# Patient Record
Sex: Female | Born: 1950 | Race: White | Hispanic: No | Marital: Married | Smoking: Former smoker
Health system: Southern US, Community
[De-identification: ages and names within clinical notes are randomized; demographics above are authoritative.]

## PROBLEM LIST (undated history)

## (undated) DIAGNOSIS — G709 Myoneural disorder, unspecified: Secondary | ICD-10-CM

## (undated) DIAGNOSIS — E785 Hyperlipidemia, unspecified: Secondary | ICD-10-CM

## (undated) DIAGNOSIS — I10 Essential (primary) hypertension: Secondary | ICD-10-CM

## (undated) DIAGNOSIS — J040 Acute laryngitis: Secondary | ICD-10-CM

## (undated) DIAGNOSIS — F419 Anxiety disorder, unspecified: Secondary | ICD-10-CM

## (undated) DIAGNOSIS — Z923 Personal history of irradiation: Secondary | ICD-10-CM

## (undated) DIAGNOSIS — R5383 Other fatigue: Secondary | ICD-10-CM

## (undated) DIAGNOSIS — Z87898 Personal history of other specified conditions: Secondary | ICD-10-CM

## (undated) DIAGNOSIS — D689 Coagulation defect, unspecified: Secondary | ICD-10-CM

## (undated) DIAGNOSIS — R0789 Other chest pain: Secondary | ICD-10-CM

## (undated) DIAGNOSIS — R7303 Prediabetes: Secondary | ICD-10-CM

## (undated) DIAGNOSIS — K219 Gastro-esophageal reflux disease without esophagitis: Secondary | ICD-10-CM

## (undated) DIAGNOSIS — C801 Malignant (primary) neoplasm, unspecified: Secondary | ICD-10-CM

## (undated) DIAGNOSIS — K224 Dyskinesia of esophagus: Secondary | ICD-10-CM

## (undated) DIAGNOSIS — M8580 Other specified disorders of bone density and structure, unspecified site: Secondary | ICD-10-CM

## (undated) HISTORY — DX: Coagulation defect, unspecified: D68.9

## (undated) HISTORY — DX: Other chest pain: R07.89

## (undated) HISTORY — DX: Gastro-esophageal reflux disease without esophagitis: K21.9

## (undated) HISTORY — PX: KNEE SURGERY: SHX244

## (undated) HISTORY — PX: SHOULDER SURGERY: SHX246

## (undated) HISTORY — DX: Dyskinesia of esophagus: K22.4

## (undated) HISTORY — DX: Malignant (primary) neoplasm, unspecified: C80.1

## (undated) HISTORY — DX: Essential (primary) hypertension: I10

## (undated) HISTORY — DX: Hyperlipidemia, unspecified: E78.5

## (undated) HISTORY — DX: Personal history of other specified conditions: Z87.898

## (undated) HISTORY — DX: Acute laryngitis: J04.0

## (undated) HISTORY — PX: ANKLE FRACTURE SURGERY: SHX122

## (undated) HISTORY — PX: VAGINAL HYSTERECTOMY: SUR661

## (undated) HISTORY — PX: OTHER SURGICAL HISTORY: SHX169

## (undated) HISTORY — DX: Other specified disorders of bone density and structure, unspecified site: M85.80

## (undated) HISTORY — DX: Other fatigue: R53.83

---
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
## 1998-06-19 ENCOUNTER — Other Ambulatory Visit: Admission: RE | Admit: 1998-06-19 | Discharge: 1998-06-19 | Payer: Self-pay | Admitting: Obstetrics and Gynecology

## 2002-01-26 ENCOUNTER — Other Ambulatory Visit: Admission: RE | Admit: 2002-01-26 | Discharge: 2002-01-26 | Payer: Self-pay | Admitting: Obstetrics and Gynecology

## 2003-03-16 ENCOUNTER — Other Ambulatory Visit: Admission: RE | Admit: 2003-03-16 | Discharge: 2003-03-16 | Payer: Self-pay | Admitting: Obstetrics and Gynecology

## 2004-04-08 ENCOUNTER — Emergency Department (HOSPITAL_COMMUNITY): Admission: EM | Admit: 2004-04-08 | Discharge: 2004-04-08 | Payer: Self-pay | Admitting: Emergency Medicine

## 2004-07-01 DIAGNOSIS — C801 Malignant (primary) neoplasm, unspecified: Secondary | ICD-10-CM

## 2004-07-01 HISTORY — DX: Malignant (primary) neoplasm, unspecified: C80.1

## 2004-07-07 ENCOUNTER — Encounter (HOSPITAL_COMMUNITY): Admission: RE | Admit: 2004-07-07 | Discharge: 2004-10-05 | Payer: Self-pay | Admitting: Surgery

## 2004-07-16 ENCOUNTER — Encounter: Admission: RE | Admit: 2004-07-16 | Discharge: 2004-07-16 | Payer: Self-pay | Admitting: Surgery

## 2004-07-17 ENCOUNTER — Ambulatory Visit (HOSPITAL_BASED_OUTPATIENT_CLINIC_OR_DEPARTMENT_OTHER): Admission: RE | Admit: 2004-07-17 | Discharge: 2004-07-17 | Payer: Self-pay | Admitting: Surgery

## 2004-07-17 ENCOUNTER — Encounter: Admission: RE | Admit: 2004-07-17 | Discharge: 2004-07-17 | Payer: Self-pay | Admitting: Surgery

## 2004-07-17 ENCOUNTER — Ambulatory Visit (HOSPITAL_COMMUNITY): Admission: RE | Admit: 2004-07-17 | Discharge: 2004-07-17 | Payer: Self-pay | Admitting: Surgery

## 2004-07-17 ENCOUNTER — Encounter (INDEPENDENT_AMBULATORY_CARE_PROVIDER_SITE_OTHER): Payer: Self-pay | Admitting: *Deleted

## 2004-07-17 HISTORY — PX: BREAST LUMPECTOMY: SHX2

## 2004-07-18 ENCOUNTER — Ambulatory Visit: Payer: Self-pay | Admitting: Oncology

## 2004-07-29 ENCOUNTER — Ambulatory Visit: Admission: RE | Admit: 2004-07-29 | Discharge: 2004-10-10 | Payer: Self-pay | Admitting: *Deleted

## 2004-07-31 DIAGNOSIS — Z923 Personal history of irradiation: Secondary | ICD-10-CM

## 2004-07-31 HISTORY — DX: Personal history of irradiation: Z92.3

## 2004-08-15 ENCOUNTER — Other Ambulatory Visit: Admission: RE | Admit: 2004-08-15 | Discharge: 2004-08-15 | Payer: Self-pay | Admitting: Gynecology

## 2004-09-17 ENCOUNTER — Ambulatory Visit: Payer: Self-pay | Admitting: Oncology

## 2004-11-04 ENCOUNTER — Ambulatory Visit: Admission: RE | Admit: 2004-11-04 | Discharge: 2004-11-04 | Payer: Self-pay | Admitting: *Deleted

## 2004-11-10 ENCOUNTER — Ambulatory Visit: Payer: Self-pay | Admitting: Oncology

## 2004-11-18 ENCOUNTER — Ambulatory Visit (HOSPITAL_COMMUNITY): Admission: RE | Admit: 2004-11-18 | Discharge: 2004-11-18 | Payer: Self-pay | Admitting: Oncology

## 2004-11-25 ENCOUNTER — Ambulatory Visit (HOSPITAL_COMMUNITY): Admission: RE | Admit: 2004-11-25 | Discharge: 2004-11-25 | Payer: Self-pay | Admitting: Oncology

## 2005-01-13 ENCOUNTER — Encounter: Admission: RE | Admit: 2005-01-13 | Discharge: 2005-01-13 | Payer: Self-pay | Admitting: Oncology

## 2005-03-24 ENCOUNTER — Ambulatory Visit: Payer: Self-pay | Admitting: Oncology

## 2005-06-09 ENCOUNTER — Encounter: Admission: RE | Admit: 2005-06-09 | Discharge: 2005-06-09 | Payer: Self-pay | Admitting: Oncology

## 2005-07-14 ENCOUNTER — Other Ambulatory Visit: Admission: RE | Admit: 2005-07-14 | Discharge: 2005-07-14 | Payer: Self-pay | Admitting: Obstetrics and Gynecology

## 2005-09-22 ENCOUNTER — Ambulatory Visit: Payer: Self-pay | Admitting: Oncology

## 2006-03-21 ENCOUNTER — Ambulatory Visit: Payer: Self-pay | Admitting: Oncology

## 2006-03-24 LAB — CBC WITH DIFFERENTIAL/PLATELET
Basophils Absolute: 0 10*3/uL (ref 0.0–0.1)
Eosinophils Absolute: 0.1 10*3/uL (ref 0.0–0.5)
HCT: 38 % (ref 34.8–46.6)
HGB: 13.3 g/dL (ref 11.6–15.9)
LYMPH%: 29.8 % (ref 14.0–48.0)
MCV: 89.7 fL (ref 81.0–101.0)
MONO%: 8.7 % (ref 0.0–13.0)
NEUT#: 3.2 10*3/uL (ref 1.5–6.5)
Platelets: 304 10*3/uL (ref 145–400)
RDW: 12.2 % (ref 11.3–14.5)

## 2006-03-24 LAB — COMPREHENSIVE METABOLIC PANEL
Albumin: 4.5 g/dL (ref 3.5–5.2)
Alkaline Phosphatase: 154 U/L — ABNORMAL HIGH (ref 39–117)
BUN: 17 mg/dL (ref 6–23)
Glucose, Bld: 131 mg/dL — ABNORMAL HIGH (ref 70–99)
Potassium: 4 mEq/L (ref 3.5–5.3)

## 2006-06-21 ENCOUNTER — Encounter: Admission: RE | Admit: 2006-06-21 | Discharge: 2006-06-21 | Payer: Self-pay | Admitting: Obstetrics and Gynecology

## 2006-09-17 ENCOUNTER — Ambulatory Visit: Payer: Self-pay | Admitting: Oncology

## 2006-09-22 LAB — CBC WITH DIFFERENTIAL/PLATELET
Basophils Absolute: 0.1 10*3/uL (ref 0.0–0.1)
Eosinophils Absolute: 0.1 10*3/uL (ref 0.0–0.5)
HGB: 13.8 g/dL (ref 11.6–15.9)
MCV: 89.6 fL (ref 81.0–101.0)
MONO#: 0.8 10*3/uL (ref 0.1–0.9)
MONO%: 8.4 % (ref 0.0–13.0)
NEUT#: 5.7 10*3/uL (ref 1.5–6.5)
RDW: 12.5 % (ref 11.3–14.5)
lymph#: 2.5 10*3/uL (ref 0.9–3.3)

## 2006-09-22 LAB — COMPREHENSIVE METABOLIC PANEL
Albumin: 4.5 g/dL (ref 3.5–5.2)
BUN: 19 mg/dL (ref 6–23)
CO2: 22 mEq/L (ref 19–32)
Calcium: 9.2 mg/dL (ref 8.4–10.5)
Chloride: 106 mEq/L (ref 96–112)
Glucose, Bld: 98 mg/dL (ref 70–99)
Potassium: 4.1 mEq/L (ref 3.5–5.3)

## 2006-10-06 ENCOUNTER — Other Ambulatory Visit: Admission: RE | Admit: 2006-10-06 | Discharge: 2006-10-06 | Payer: Self-pay | Admitting: Obstetrics and Gynecology

## 2007-03-25 ENCOUNTER — Ambulatory Visit: Payer: Self-pay | Admitting: Oncology

## 2007-03-29 LAB — COMPREHENSIVE METABOLIC PANEL
Albumin: 4.3 g/dL (ref 3.5–5.2)
Alkaline Phosphatase: 154 U/L — ABNORMAL HIGH (ref 39–117)
CO2: 25 mEq/L (ref 19–32)
Glucose, Bld: 90 mg/dL (ref 70–99)
Potassium: 4.2 mEq/L (ref 3.5–5.3)
Sodium: 141 mEq/L (ref 135–145)
Total Protein: 7.2 g/dL (ref 6.0–8.3)

## 2007-03-29 LAB — CBC WITH DIFFERENTIAL/PLATELET
Eosinophils Absolute: 0.1 10*3/uL (ref 0.0–0.5)
MONO#: 0.8 10*3/uL (ref 0.1–0.9)
MONO%: 9.9 % (ref 0.0–13.0)
NEUT#: 4.4 10*3/uL (ref 1.5–6.5)
RBC: 4.25 10*6/uL (ref 3.70–5.32)
RDW: 12.7 % (ref 11.3–14.5)
WBC: 7.6 10*3/uL (ref 3.9–10.0)

## 2007-06-29 ENCOUNTER — Encounter: Admission: RE | Admit: 2007-06-29 | Discharge: 2007-06-29 | Payer: Self-pay | Admitting: Family Medicine

## 2007-09-20 ENCOUNTER — Ambulatory Visit: Payer: Self-pay | Admitting: Oncology

## 2007-09-22 LAB — CBC WITH DIFFERENTIAL/PLATELET
BASO%: 0.9 % (ref 0.0–2.0)
EOS%: 1.7 % (ref 0.0–7.0)
LYMPH%: 33.5 % (ref 14.0–48.0)
MCH: 31.6 pg (ref 26.0–34.0)
MCHC: 35.1 g/dL (ref 32.0–36.0)
MONO#: 0.7 10*3/uL (ref 0.1–0.9)
MONO%: 10.2 % (ref 0.0–13.0)
Platelets: 367 10*3/uL (ref 145–400)
RBC: 4.43 10*6/uL (ref 3.70–5.32)
WBC: 7.3 10*3/uL (ref 3.9–10.0)

## 2007-09-22 LAB — COMPREHENSIVE METABOLIC PANEL
ALT: 15 U/L (ref 0–35)
AST: 14 U/L (ref 0–37)
Alkaline Phosphatase: 146 U/L — ABNORMAL HIGH (ref 39–117)
CO2: 24 mEq/L (ref 19–32)
Sodium: 141 mEq/L (ref 135–145)
Total Bilirubin: 0.4 mg/dL (ref 0.3–1.2)
Total Protein: 7.6 g/dL (ref 6.0–8.3)

## 2008-04-03 ENCOUNTER — Ambulatory Visit: Payer: Self-pay | Admitting: Oncology

## 2008-04-05 LAB — COMPREHENSIVE METABOLIC PANEL
ALT: 16 U/L (ref 0–35)
AST: 13 U/L (ref 0–37)
Albumin: 4.7 g/dL (ref 3.5–5.2)
Alkaline Phosphatase: 141 U/L — ABNORMAL HIGH (ref 39–117)
BUN: 23 mg/dL (ref 6–23)
CO2: 22 mEq/L (ref 19–32)
Calcium: 9.6 mg/dL (ref 8.4–10.5)
Chloride: 105 mEq/L (ref 96–112)
Creatinine, Ser: 0.75 mg/dL (ref 0.40–1.20)
Glucose, Bld: 109 mg/dL — ABNORMAL HIGH (ref 70–99)
Potassium: 4.2 mEq/L (ref 3.5–5.3)
Sodium: 140 mEq/L (ref 135–145)
Total Bilirubin: 0.3 mg/dL (ref 0.3–1.2)
Total Protein: 7.9 g/dL (ref 6.0–8.3)

## 2008-04-05 LAB — URINALYSIS, MICROSCOPIC - CHCC
Ketones: NEGATIVE mg/dL
Leukocyte Esterase: NEGATIVE
Nitrite: NEGATIVE
Protein: NEGATIVE mg/dL
RBC count: NEGATIVE (ref 0–2)
pH: 6 (ref 4.6–8.0)

## 2008-04-05 LAB — CBC WITH DIFFERENTIAL/PLATELET
Basophils Absolute: 0 10*3/uL (ref 0.0–0.1)
EOS%: 1.6 % (ref 0.0–7.0)
Eosinophils Absolute: 0.1 10*3/uL (ref 0.0–0.5)
HGB: 13.4 g/dL (ref 11.6–15.9)
MONO%: 7.3 % (ref 0.0–13.0)
NEUT#: 5 10*3/uL (ref 1.5–6.5)
RBC: 4.22 10*6/uL (ref 3.70–5.32)
RDW: 12.8 % (ref 11.3–14.5)
lymph#: 2.2 10*3/uL (ref 0.9–3.3)

## 2008-06-19 ENCOUNTER — Ambulatory Visit: Payer: Self-pay | Admitting: Obstetrics and Gynecology

## 2008-07-12 ENCOUNTER — Other Ambulatory Visit: Admission: RE | Admit: 2008-07-12 | Discharge: 2008-07-12 | Payer: Self-pay | Admitting: Obstetrics and Gynecology

## 2008-07-12 ENCOUNTER — Encounter: Admission: RE | Admit: 2008-07-12 | Discharge: 2008-07-12 | Payer: Self-pay | Admitting: Oncology

## 2008-07-12 ENCOUNTER — Encounter: Payer: Self-pay | Admitting: Obstetrics and Gynecology

## 2008-07-12 ENCOUNTER — Ambulatory Visit: Payer: Self-pay | Admitting: Obstetrics and Gynecology

## 2008-09-17 ENCOUNTER — Ambulatory Visit: Payer: Self-pay | Admitting: Oncology

## 2009-03-25 ENCOUNTER — Ambulatory Visit: Payer: Self-pay | Admitting: Oncology

## 2009-03-28 LAB — CBC WITH DIFFERENTIAL/PLATELET
Basophils Absolute: 0.1 10*3/uL (ref 0.0–0.1)
Eosinophils Absolute: 0.1 10*3/uL (ref 0.0–0.5)
HGB: 13.4 g/dL (ref 11.6–15.9)
MCV: 90.7 fL (ref 79.5–101.0)
MONO#: 0.6 10*3/uL (ref 0.1–0.9)
NEUT#: 4.6 10*3/uL (ref 1.5–6.5)
RBC: 4.1 10*6/uL (ref 3.70–5.45)
RDW: 12.2 % (ref 11.2–14.5)
WBC: 7.3 10*3/uL (ref 3.9–10.3)
lymph#: 2 10*3/uL (ref 0.9–3.3)

## 2009-03-28 LAB — COMPREHENSIVE METABOLIC PANEL
Albumin: 4.2 g/dL (ref 3.5–5.2)
BUN: 12 mg/dL (ref 6–23)
CO2: 24 mEq/L (ref 19–32)
Calcium: 9 mg/dL (ref 8.4–10.5)
Chloride: 110 mEq/L (ref 96–112)
Glucose, Bld: 140 mg/dL — ABNORMAL HIGH (ref 70–99)
Potassium: 3.7 mEq/L (ref 3.5–5.3)
Sodium: 137 mEq/L (ref 135–145)
Total Protein: 7.9 g/dL (ref 6.0–8.3)

## 2009-10-01 ENCOUNTER — Ambulatory Visit: Payer: Self-pay | Admitting: Oncology

## 2009-10-03 LAB — CBC WITH DIFFERENTIAL/PLATELET
BASO%: 0.6 % (ref 0.0–2.0)
Basophils Absolute: 0 10*3/uL (ref 0.0–0.1)
EOS%: 1.8 % (ref 0.0–7.0)
Eosinophils Absolute: 0.1 10*3/uL (ref 0.0–0.5)
HCT: 37.6 % (ref 34.8–46.6)
HGB: 12.9 g/dL (ref 11.6–15.9)
LYMPH%: 30 % (ref 14.0–49.7)
MCH: 31.6 pg (ref 25.1–34.0)
MCHC: 34.3 g/dL (ref 31.5–36.0)
MCV: 92 fL (ref 79.5–101.0)
MONO#: 0.7 10*3/uL (ref 0.1–0.9)
MONO%: 10.4 % (ref 0.0–14.0)
NEUT#: 3.7 10*3/uL (ref 1.5–6.5)
NEUT%: 57.2 % (ref 38.4–76.8)
Platelets: 382 10*3/uL (ref 145–400)
RBC: 4.09 10*6/uL (ref 3.70–5.45)
RDW: 12.7 % (ref 11.2–14.5)
WBC: 6.5 10*3/uL (ref 3.9–10.3)
lymph#: 2 10*3/uL (ref 0.9–3.3)

## 2009-10-03 LAB — COMPREHENSIVE METABOLIC PANEL
ALT: 18 U/L (ref 0–35)
AST: 15 U/L (ref 0–37)
Albumin: 3.9 g/dL (ref 3.5–5.2)
Alkaline Phosphatase: 133 U/L — ABNORMAL HIGH (ref 39–117)
BUN: 13 mg/dL (ref 6–23)
CO2: 27 mEq/L (ref 19–32)
Calcium: 9.4 mg/dL (ref 8.4–10.5)
Chloride: 107 mEq/L (ref 96–112)
Creatinine, Ser: 0.65 mg/dL (ref 0.40–1.20)
Glucose, Bld: 103 mg/dL — ABNORMAL HIGH (ref 70–99)
Potassium: 4.3 mEq/L (ref 3.5–5.3)
Sodium: 140 mEq/L (ref 135–145)
Total Bilirubin: 0.5 mg/dL (ref 0.3–1.2)
Total Protein: 7.9 g/dL (ref 6.0–8.3)

## 2009-11-22 ENCOUNTER — Ambulatory Visit: Payer: Self-pay | Admitting: Obstetrics and Gynecology

## 2009-11-27 ENCOUNTER — Other Ambulatory Visit: Admission: RE | Admit: 2009-11-27 | Discharge: 2009-11-27 | Payer: Self-pay | Admitting: Obstetrics and Gynecology

## 2009-11-27 ENCOUNTER — Encounter: Admission: RE | Admit: 2009-11-27 | Discharge: 2009-11-27 | Payer: Self-pay | Admitting: Obstetrics and Gynecology

## 2009-11-27 ENCOUNTER — Ambulatory Visit: Payer: Self-pay | Admitting: Obstetrics and Gynecology

## 2010-02-18 ENCOUNTER — Ambulatory Visit: Payer: Self-pay | Admitting: Obstetrics and Gynecology

## 2010-11-11 ENCOUNTER — Other Ambulatory Visit: Payer: Self-pay | Admitting: Obstetrics and Gynecology

## 2010-11-11 DIAGNOSIS — Z9889 Other specified postprocedural states: Secondary | ICD-10-CM

## 2010-11-18 ENCOUNTER — Encounter: Payer: Self-pay | Admitting: Family Medicine

## 2010-11-26 ENCOUNTER — Encounter: Payer: Self-pay | Admitting: Cardiology

## 2010-12-17 ENCOUNTER — Encounter: Payer: Self-pay | Admitting: Obstetrics and Gynecology

## 2010-12-17 ENCOUNTER — Other Ambulatory Visit: Payer: Self-pay | Admitting: Obstetrics and Gynecology

## 2010-12-17 ENCOUNTER — Other Ambulatory Visit (HOSPITAL_COMMUNITY)
Admission: RE | Admit: 2010-12-17 | Discharge: 2010-12-17 | Disposition: A | Discharge status: Home / Self Care | Payer: BC Managed Care – PPO | Source: Ambulatory Visit | Attending: Obstetrics and Gynecology | Admitting: Obstetrics and Gynecology

## 2010-12-17 ENCOUNTER — Ambulatory Visit
Admission: RE | Admit: 2010-12-17 | Discharge: 2010-12-17 | Disposition: A | Discharge status: Home / Self Care | Payer: BC Managed Care – PPO | Source: Ambulatory Visit | Attending: Obstetrics and Gynecology | Admitting: Obstetrics and Gynecology

## 2010-12-17 ENCOUNTER — Encounter (INDEPENDENT_AMBULATORY_CARE_PROVIDER_SITE_OTHER): Payer: BC Managed Care – PPO | Admitting: Obstetrics and Gynecology

## 2010-12-17 DIAGNOSIS — Z124 Encounter for screening for malignant neoplasm of cervix: Secondary | ICD-10-CM | POA: Insufficient documentation

## 2010-12-17 DIAGNOSIS — Z9889 Other specified postprocedural states: Secondary | ICD-10-CM

## 2010-12-17 DIAGNOSIS — Z01419 Encounter for gynecological examination (general) (routine) without abnormal findings: Secondary | ICD-10-CM

## 2010-12-23 ENCOUNTER — Encounter: Payer: Self-pay | Admitting: Cardiology

## 2010-12-23 DIAGNOSIS — J040 Acute laryngitis: Secondary | ICD-10-CM | POA: Insufficient documentation

## 2010-12-30 ENCOUNTER — Encounter: Payer: Self-pay | Admitting: Cardiology

## 2010-12-30 ENCOUNTER — Ambulatory Visit (INDEPENDENT_AMBULATORY_CARE_PROVIDER_SITE_OTHER): Payer: BC Managed Care – PPO | Admitting: Cardiology

## 2010-12-30 DIAGNOSIS — R0789 Other chest pain: Secondary | ICD-10-CM | POA: Insufficient documentation

## 2010-12-30 DIAGNOSIS — K219 Gastro-esophageal reflux disease without esophagitis: Secondary | ICD-10-CM | POA: Insufficient documentation

## 2010-12-30 DIAGNOSIS — Z87898 Personal history of other specified conditions: Secondary | ICD-10-CM | POA: Insufficient documentation

## 2010-12-30 DIAGNOSIS — K224 Dyskinesia of esophagus: Secondary | ICD-10-CM | POA: Insufficient documentation

## 2010-12-30 DIAGNOSIS — R5383 Other fatigue: Secondary | ICD-10-CM | POA: Insufficient documentation

## 2010-12-30 DIAGNOSIS — I10 Essential (primary) hypertension: Secondary | ICD-10-CM

## 2010-12-30 DIAGNOSIS — E785 Hyperlipidemia, unspecified: Secondary | ICD-10-CM | POA: Insufficient documentation

## 2010-12-30 NOTE — Patient Instructions (Signed)
We will schedule you for a stress Echo to evaluate your cardiac risk.  Continue your current medications but hold your Atenolol the morning of your stress test.  If your stress test is normal then I would focus on controlling your blood pressure and cholesterol and in getting more aerobic exercise.

## 2010-12-30 NOTE — Assessment & Plan Note (Signed)
Blood pressure control is acceptable her current medications. Have recommended avoidance of sodium.

## 2010-12-30 NOTE — Assessment & Plan Note (Signed)
Symptoms are more suggestive of her known esophageal disease. However she does have cardiac risk factors and we need to make sure that her symptoms are not anginal in nature. We will schedule her for a stress echo evaluation to further stratify her cardiac risk. If this is unremarkable then I would recommend increasing her aerobic activity and continuing with risk factor modification.

## 2010-12-30 NOTE — Assessment & Plan Note (Signed)
Continue with antireflux measures and nitrate therapy.

## 2010-12-30 NOTE — Progress Notes (Signed)
Georga Hacking Gow Date of Birth: 1951-07-16   History of Present Illness: Mrs. Schmid is seen the request of Dr. Jeannetta Nap for evaluation of chest pain. She is a pleasant 60 year old white female who does have a history of hypertension and hyperlipidemia. She also has family history of heart disease. She states for many years she has been expressing symptoms of chest pressure. This is associated with symptoms of dysphasia. She has been diagnosed with a spastic esophagus. She also has acid reflux disease. She was to make sure because of her risk factors she is not having cardiac symptoms. She does report that she wore monitors many years ago because of some mild palpitations but that everything checked out okay. She's had no other formal cardiac evaluation.  Current Outpatient Prescriptions on File Prior to Visit  Medication Sig Dispense Refill  . atenolol (TENORMIN) 100 MG tablet Take 100 mg by mouth 1 dose over 46 hours.       . diclofenac (VOLTAREN) 75 MG EC tablet Take 1 tablet by mouth Twice daily.      Marland Kitchen esomeprazole (NEXIUM) 20 MG capsule Take 20 mg by mouth daily before breakfast.        . ESTRACE VAGINAL 0.1 MG/GM vaginal cream as directed.      . Fluticasone Propionate (FLONASE NA) by Nasal route as needed.        Marland Kitchen gemfibrozil (LOPID) 600 MG tablet Take 600 mg by mouth 1 dose over 46 hours.       . isosorbide mononitrate (IMDUR) 30 MG 24 hr tablet Take 30 mg by mouth 2 (two) times daily.       Marland Kitchen LORazepam (ATIVAN) 0.5 MG tablet Take 0.5 mg by mouth as needed.        Marland Kitchen terconazole (TERAZOL 7) 0.4 % vaginal cream as directed.        Allergies  Allergen Reactions  . Caine-1 (Lidocaine Hcl)   . Novocain     Past Medical History  Diagnosis Date  . Laryngitis   . Cancer 07/2004    breast cancer  . Clotting disorder 1970's    from birthcontrol   . Hyperlipidemia   . Hypertension   . History of dizziness   . Fatigue   . Chest pressure   . Esophageal spasm   . GERD  (gastroesophageal reflux disease)     Past Surgical History  Procedure Date  . Breast lumpectomy 07/17/2004    left breast  . Vaginal hysterectomy     History  Smoking status  . Former Smoker -- 0.5 packs/day for 20 years  . Types: Cigarettes  . Quit date: 08/31/1990  Smokeless tobacco  . Never Used    History  Alcohol Use No    Family History  Problem Relation Age of Onset  . Atrial fibrillation Father   . Emphysema Father   . Heart attack Father     muliple  . Atrial fibrillation Maternal Grandfather   . Atrial fibrillation Maternal Grandmother   . Diabetes Maternal Grandmother   . Atrial fibrillation Paternal Grandmother   . Cancer Paternal Grandmother     x5 aunts with cancer / 1 uncle colon cancer  . Asthma Sister     life long ( 1 y/o )  . Depression Brother   . Coronary artery disease Brother     CABG X3  . Stroke Sister     x6 strokes is 17 y/o    Review of Systems: The review of systems  is positive for chronic chest pain and dysphasia. She has had minor palpitations in the past.  All other systems were reviewed and are negative.  Physical Exam: BP 138/74  Pulse 74  Ht 5\' 6"  (1.676 m)  Wt 186 lb (84.369 kg)  BMI 30.02 kg/m2 The patient is alert and oriented x 3.  The mood and affect are normal.  The skin is warm and dry.  Color is normal.  The HEENT exam reveals that the sclera are nonicteric.  The mucous membranes are moist.  The carotids are 2+ without bruits.  There is no thyromegaly.  There is no JVD.  The lungs are clear.  The chest wall is non tender.  The heart exam reveals a regular rate with a normal S1 and S2.  There are no murmurs, gallops, or rubs.  The PMI is not displaced.   Abdominal exam reveals good bowel sounds.  There is no guarding or rebound.  There is no hepatosplenomegaly or tenderness.  There are no masses.  Exam of the legs reveal no clubbing, cyanosis, or edema.  The legs are without rashes.  The distal pulses are intact.  Cranial  nerves II - XII are intact.  Motor and sensory functions are intact.  The gait is normal.  LABORATORY DATA: ECG demonstrates normal sinus rhythm with LVH by voltage. She has nonspecific T-wave abnormality.  Assessment / Plan:

## 2011-01-06 ENCOUNTER — Encounter (INDEPENDENT_AMBULATORY_CARE_PROVIDER_SITE_OTHER): Payer: BC Managed Care – PPO

## 2011-01-06 DIAGNOSIS — M949 Disorder of cartilage, unspecified: Secondary | ICD-10-CM

## 2011-01-06 DIAGNOSIS — M899 Disorder of bone, unspecified: Secondary | ICD-10-CM

## 2011-01-15 ENCOUNTER — Telehealth: Payer: Self-pay | Admitting: Cardiology

## 2011-01-16 NOTE — Op Note (Signed)
NAMEMALIN, SAMBRANO          ACCOUNT NO.:  [id number]   MEDICAL RECORD NO.:  [id number]          PATIENT TYPE:  AMB   LOCATION:  DSC                          FACILITY:  [facility]   PHYSICIAN:  Currie Paris, M.D.DATE OF BIRTH:  1951/03/03   DATE OF PROCEDURE:  07/17/2004  DATE OF DISCHARGE:                                 OPERATIVE REPORT   CCS [phone].   PREOPERATIVE DIAGNOSIS:  Carcinoma, left breast (ductal carcinoma in situ),  lower inner quadrant.   POSTOPERATIVE DIAGNOSIS:  Carcinoma, left breast (ductal carcinoma in situ),  lower inner quadrant.   OPERATION:  Needle-guided excision of left breast cancer.   SURGEON:  Currie Paris, M.D.   ANESTHESIA:  General.   CLINICAL HISTORY:  This patient originally had a mammographic abnormality,  and a core biopsy showed DCIS.  It was in the lower inner quadrant.   DESCRIPTION OF PROCEDURE:  The patient was seen in the holding area, and she  had no further questions.  Deep guidewires had been placed, one of which  tracked a little bit anterior to the abnormality and one posterior.  Both  guidewires entered the lower inner quadrant and tracked superiorly and then  toward the nipple.  The prior core biopsy site was a little higher at about  the 8 o'clock position, where these entered about the 6:30 to 7 o'clock  position.   The initial incision was made starting at the prior core biopsy site and  going toward the nipple.  I took a little ellipse of skin.  I then raised  the flap inferiorly toward the two guidewires that were coming in from that  area, got beyond them down to the chest wall, and manipulated both wires  into the wound.  I then went around medially (sternal side) and down to the  chest wall and then superiorly raised a flap and then carried that incision  down toward the chest wall as well.  I then came underneath the entire  specimen right on the level of the chest wall to take the fascia, working  from medial toward lateral or toward the nipple.  Once I was well under this  whole area, I was able to manipulate it up into the wound and then using  cautery divided the remaining attachments, which were basically at the  nipple end of the excision.   This was sent for specimen mammography.  While that was waiting, I went  ahead to be absolutely sure we had as much good margin as we could, took  another centimeter of margin both along the superior aspect of the excision  and the subareolar aspect of the incision.   Bleeders were controlled with the cautery and when everything was dry, I put  some Marcaine in to help with the postoperative analgesia, then used clips  to mark the margins of the excision.  Everything remained dry, so I went  ahead and closed with some 3-0 Vicryl, followed by 4-0 Monocryl subcuticular  and Dermabond.   The patient tolerated the procedure well.  There were no operative  complications.  All counts were  correct.  Radiology reported that the  abnormality was well within the specimen mammogram.      Chri   CJS/MEDQ  D:  07/17/2004  T:  07/18/2004  Job:  [id number]   cc:   Windle Guard, M.D.  [address]  [place], [place] [address]  Fax: [phone]   Rande Brunt. Eda Paschal, M.D.  [address]  [place]  [place] [address]  Fax: [phone]

## 2011-02-03 ENCOUNTER — Other Ambulatory Visit (HOSPITAL_COMMUNITY): Payer: BC Managed Care – PPO | Admitting: Radiology

## 2011-05-12 ENCOUNTER — Other Ambulatory Visit: Payer: Self-pay | Admitting: Obstetrics and Gynecology

## 2011-05-15 NOTE — Telephone Encounter (Signed)
DENIED GENERIC TERAZOL 7 RX BY [facility]. PT GIVEN 10 REFILLS OF GENERIC TERAZOL 3 AT 12-17-10 AEX. I CONFIRMED WITH KRISTINA A [facility] PT DOES HAVE REFILLS ON THE 3 DAY RX. 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 WHEN SHE COMES IN TO WORK TODAY AT 1P.M.

## 2012-02-18 ENCOUNTER — Other Ambulatory Visit: Payer: Self-pay | Admitting: Obstetrics and Gynecology

## 2012-02-18 DIAGNOSIS — Z1231 Encounter for screening mammogram for malignant neoplasm of breast: Secondary | ICD-10-CM

## 2012-02-24 ENCOUNTER — Encounter: Payer: Self-pay | Admitting: Gynecology

## 2012-02-24 DIAGNOSIS — M8580 Other specified disorders of bone density and structure, unspecified site: Secondary | ICD-10-CM | POA: Insufficient documentation

## 2012-03-01 ENCOUNTER — Ambulatory Visit (INDEPENDENT_AMBULATORY_CARE_PROVIDER_SITE_OTHER): Payer: BC Managed Care – PPO | Admitting: Obstetrics and Gynecology

## 2012-03-01 ENCOUNTER — Encounter: Payer: Self-pay | Admitting: Obstetrics and Gynecology

## 2012-03-01 VITALS — BP 120/76 | Ht 65.0 in | Wt 190.0 lb

## 2012-03-01 DIAGNOSIS — Z01419 Encounter for gynecological examination (general) (routine) without abnormal findings: Secondary | ICD-10-CM

## 2012-03-01 DIAGNOSIS — N899 Noninflammatory disorder of vagina, unspecified: Secondary | ICD-10-CM

## 2012-03-01 DIAGNOSIS — N898 Other specified noninflammatory disorders of vagina: Secondary | ICD-10-CM

## 2012-03-01 LAB — URINALYSIS W MICROSCOPIC + REFLEX CULTURE
Hgb urine dipstick: NEGATIVE
Ketones, ur: NEGATIVE mg/dL
Leukocytes, UA: NEGATIVE
Nitrite: NEGATIVE
Specific Gravity, Urine: 1.015 (ref 1.005–1.030)
Urobilinogen, UA: 0.2 mg/dL (ref 0.0–1.0)
pH: 5.5 (ref 5.0–8.0)

## 2012-03-01 LAB — WET PREP FOR TRICH, YEAST, CLUE: Trich, Wet Prep: NONE SEEN

## 2012-03-01 NOTE — Progress Notes (Signed)
Patient came to see me today for her annual GYN exam. She remains breast cancer free. She is due for her mammogram next week. She continues vaginal estrogen cream for atrophic vaginitis with good results. She gotten her oncologist permission to do so. She is having no vaginal bleeding. She is having no pelvic pain. She has had intermittent labial swelling with decreased stream of urination. She is used terconazole intermittently for the year with relief of labial swelling. Last year her bone density showed low bone mass without an elevated FRher last Pap smearAX risk. Her bone density has been stable. She has not had any fractures. She does her lab work to her PCP. She is status post vaginal hysterectomy and has not had abnormal Pap smears. Her last Pap smear was April of 2012.  HEENT: Within normal limits. Neck: No masses. Supraclavicular lymph nodes: Not enlarged. Breasts: Examined in both sitting and lying position. Symmetrical without skin changes or masses. Abdomen: Soft no masses guarding or rebound. No hernias. Pelvic: External shows vulvitis BUS within normal limits. Vaginal examination shows good estrogen effect, no cystocele enterocele or rectocele. Wet prep is negative. Cervix and uterus absent. Adnexa within normal limits. Rectovaginal confirmatory. Extremities within normal limits.  Assessment: Intermittently labial swelling with decreased urinary stream. Probable yeast vaginitis.low bone mass.  Plan: For the moment we treated her with terconazole 3 cream in spite of a negative wet prep. We will see what her urine culture shows. At some point we may want 2 refer her to a urologist. Continue yearly  mammograms. Continue vaginal estrogen. Bone density next year. The new Pap smear guidelines were discussed with the patient. No pap  done.

## 2012-03-08 ENCOUNTER — Ambulatory Visit
Admission: RE | Admit: 2012-03-08 | Discharge: 2012-03-08 | Disposition: A | Discharge status: Home / Self Care | Payer: BC Managed Care – PPO | Source: Ambulatory Visit | Attending: Obstetrics and Gynecology | Admitting: Obstetrics and Gynecology

## 2012-03-08 ENCOUNTER — Encounter: Payer: Self-pay | Admitting: Obstetrics and Gynecology

## 2012-03-08 DIAGNOSIS — Z1231 Encounter for screening mammogram for malignant neoplasm of breast: Secondary | ICD-10-CM

## 2012-04-01 ENCOUNTER — Other Ambulatory Visit: Payer: Self-pay | Admitting: Obstetrics and Gynecology

## 2012-08-29 ENCOUNTER — Other Ambulatory Visit: Payer: Self-pay | Admitting: Obstetrics and Gynecology

## 2013-04-02 ENCOUNTER — Other Ambulatory Visit: Payer: Self-pay | Admitting: Obstetrics and Gynecology

## 2013-04-10 ENCOUNTER — Other Ambulatory Visit: Payer: Self-pay | Admitting: Obstetrics and Gynecology

## 2014-05-09 ENCOUNTER — Other Ambulatory Visit: Payer: Self-pay

## 2014-05-09 DIAGNOSIS — Z1231 Encounter for screening mammogram for malignant neoplasm of breast: Secondary | ICD-10-CM

## 2014-06-01 ENCOUNTER — Ambulatory Visit
Admission: RE | Admit: 2014-06-01 | Discharge: 2014-06-01 | Disposition: A | Discharge status: Home / Self Care | Payer: BC Managed Care – PPO | Source: Ambulatory Visit

## 2014-06-01 DIAGNOSIS — Z1231 Encounter for screening mammogram for malignant neoplasm of breast: Secondary | ICD-10-CM

## 2014-07-02 ENCOUNTER — Encounter: Payer: Self-pay | Admitting: Obstetrics and Gynecology

## 2014-11-21 ENCOUNTER — Other Ambulatory Visit: Payer: Self-pay | Admitting: Obstetrics and Gynecology

## 2015-02-15 ENCOUNTER — Ambulatory Visit (INDEPENDENT_AMBULATORY_CARE_PROVIDER_SITE_OTHER): Payer: BLUE CROSS/BLUE SHIELD | Admitting: Women's Health

## 2015-02-15 ENCOUNTER — Encounter: Payer: Self-pay | Admitting: Women's Health

## 2015-02-15 VITALS — BP 124/78 | Ht 65.0 in | Wt 185.0 lb

## 2015-02-15 DIAGNOSIS — Z01419 Encounter for gynecological examination (general) (routine) without abnormal findings: Secondary | ICD-10-CM

## 2015-02-15 DIAGNOSIS — Z79890 Hormone replacement therapy (postmenopausal): Secondary | ICD-10-CM

## 2015-02-15 MED ORDER — ESTRADIOL 0.1 MG/GM VA CREA: TOPICAL_CREAM | VAGINAL | Status: DC

## 2015-02-15 NOTE — Patient Instructions (Signed)
Health Recommendations for Postmenopausal Women Respected and ongoing research has looked at the most common causes of death, disability, and poor quality of life in postmenopausal women. The causes include heart disease, diseases of blood vessels, diabetes, depression, cancer, and bone loss (osteoporosis). Many things can be done to help lower the chances of developing these and other common problems. CARDIOVASCULAR DISEASE Heart Disease: A heart attack is a medical emergency. Know the signs and symptoms of a heart attack. Below are things women can do to reduce their risk for heart disease.   Do not smoke. If you smoke, quit.  Aim for a healthy weight. Being overweight causes many preventable deaths. Eat a healthy and balanced diet and drink an adequate amount of liquids.  Get moving. Make a commitment to be more physically active. Aim for 30 minutes of activity on most, if not all days of the week.  Eat for heart health. Choose a diet that is low in saturated fat and cholesterol and eliminate trans fat. Include whole grains, vegetables, and fruits. Read and understand the labels on food containers before buying.  Know your numbers. Ask your caregiver to check your blood pressure, cholesterol (total, HDL, LDL, triglycerides) and blood glucose. Work with your caregiver on improving your entire clinical picture.  High blood pressure. Limit or stop your table salt intake (try salt substitute and food seasonings). Avoid salty foods and drinks. Read labels on food containers before buying. Eating well and exercising can help control high blood pressure. STROKE  Stroke is a medical emergency. Stroke may be the result of a blood clot in a blood vessel in the brain or by a brain hemorrhage (bleeding). Know the signs and symptoms of a stroke. To lower the risk of developing a stroke:  Avoid fatty foods.  Quit smoking.  Control your diabetes, blood pressure, and irregular heart rate. THROMBOPHLEBITIS  (BLOOD CLOT) OF THE LEG  Becoming overweight and leading a stationary lifestyle may also contribute to developing blood clots. Controlling your diet and exercising will help lower the risk of developing blood clots. CANCER SCREENING  Breast Cancer: Take steps to reduce your risk of breast cancer.  You should practice "breast self-awareness." This means understanding the normal appearance and feel of your breasts and should include breast self-examination. Any changes detected, no matter how small, should be reported to your caregiver.  After age 40, you should have a clinical breast exam (CBE) every year.  Starting at age 40, you should consider having a mammogram (breast X-ray) every year.  If you have a family history of breast cancer, talk to your caregiver about genetic screening.  If you are at high risk for breast cancer, talk to your caregiver about having an MRI and a mammogram every year.  Intestinal or Stomach Cancer: Tests to consider are a rectal exam, fecal occult blood, sigmoidoscopy, and colonoscopy. Women who are high risk may need to be screened at an earlier age and more often.  Cervical Cancer:  Beginning at age 30, you should have a Pap test every 3 years as long as the past 3 Pap tests have been normal.  If you have had past treatment for cervical cancer or a condition that could lead to cancer, you need Pap tests and screening for cancer for at least 20 years after your treatment.  If you had a hysterectomy for a problem that was not cancer or a condition that could lead to cancer, then you no longer need Pap tests.    If you are between ages 65 and 70, and you have had normal Pap tests going back 10 years, you no longer need Pap tests.  If Pap tests have been discontinued, risk factors (such as a new sexual partner) need to be reassessed to determine if screening should be resumed.  Some medical problems can increase the chance of getting cervical cancer. In these  cases, your caregiver may recommend more frequent screening and Pap tests.  Uterine Cancer: If you have vaginal bleeding after reaching menopause, you should notify your caregiver.  Ovarian Cancer: Other than yearly pelvic exams, there are no reliable tests available to screen for ovarian cancer at this time except for yearly pelvic exams.  Lung Cancer: Yearly chest X-rays can detect lung cancer and should be done on high risk women, such as cigarette smokers and women with chronic lung disease (emphysema).  Skin Cancer: A complete body skin exam should be done at your yearly examination. Avoid overexposure to the sun and ultraviolet light lamps. Use a strong sun block cream when in the sun. All of these things are important for lowering the risk of skin cancer. MENOPAUSE Menopause Symptoms: Hormone therapy products are effective for treating symptoms associated with menopause:  Moderate to severe hot flashes.  Night sweats.  Mood swings.  Headaches.  Tiredness.  Loss of sex drive.  Insomnia.  Other symptoms. Hormone replacement carries certain risks, especially in older women. Women who use or are thinking about using estrogen or estrogen with progestin treatments should discuss that with their caregiver. Your caregiver will help you understand the benefits and risks. The ideal dose of hormone replacement therapy is not known. The Food and Drug Administration (FDA) has concluded that hormone therapy should be used only at the lowest doses and for the shortest amount of time to reach treatment goals.  OSTEOPOROSIS Protecting Against Bone Loss and Preventing Fracture If you use hormone therapy for prevention of bone loss (osteoporosis), the risks for bone loss must outweigh the risk of the therapy. Ask your caregiver about other medications known to be safe and effective for preventing bone loss and fractures. To guard against bone loss or fractures, the following is recommended:  If  you are younger than age 50, take 1000 mg of calcium and at least 600 mg of Vitamin D per day.  If you are older than age 50 but younger than age 70, take 1200 mg of calcium and at least 600 mg of Vitamin D per day.  If you are older than age 70, take 1200 mg of calcium and at least 800 mg of Vitamin D per day. Smoking and excessive alcohol intake increases the risk of osteoporosis. Eat foods rich in calcium and vitamin D and do weight bearing exercises several times a week as your caregiver suggests. DIABETES Diabetes Mellitus: If you have type I or type 2 diabetes, you should keep your blood sugar under control with diet, exercise, and recommended medication. Avoid starchy and fatty foods, and too many sweets. Being overweight can make diabetes control more difficult. COGNITION AND MEMORY Cognition and Memory: Menopausal hormone therapy is not recommended for the prevention of cognitive disorders such as Alzheimer's disease or memory loss.  DEPRESSION  Depression may occur at any age, but it is common in elderly women. This may be because of physical, medical, social (loneliness), or financial problems and needs. If you are experiencing depression because of medical problems and control of symptoms, talk to your caregiver about this. Physical   activity and exercise may help with mood and sleep. Community and volunteer involvement may improve your sense of value and worth. If you have depression and you feel that the problem is getting worse or becoming severe, talk to your caregiver about which treatment options are best for you. ACCIDENTS  Accidents are common and can be serious in elderly woman. Prepare your house to prevent accidents. Eliminate throw rugs, place hand bars in bath, shower, and toilet areas. Avoid wearing high heeled shoes or walking on wet, snowy, and icy areas. Limit or stop driving if you have vision or hearing problems, or if you feel you are unsteady with your movements and  reflexes. HEPATITIS C Hepatitis C is a type of viral infection affecting the liver. It is spread mainly through contact with blood from an infected person. It can be treated, but if left untreated, it can lead to severe liver damage over the years. Many people who are infected do not know that the virus is in their blood. If you are a "baby-boomer", it is recommended that you have one screening test for Hepatitis C. IMMUNIZATIONS  Several immunizations are important to consider having during your senior years, including:   Tetanus, diphtheria, and pertussis booster shot.  Influenza every year before the flu season begins.  Pneumonia vaccine.  Shingles vaccine.  Others, as indicated based on your specific needs. Talk to your caregiver about these. Document Released: 10/09/2005 Document Revised: 01/01/2014 Document Reviewed: 06/04/2008 ExitCare Patient Information 2015 [employer]. This information is not intended to replace advice given to you by your health care provider. Make sure you discuss any questions you have with your health care provider.  

## 2015-02-15 NOTE — Progress Notes (Signed)
Claire Bernard Aug 09, 1951 [id number]    History:    Presents for annual exam.  TVH with history of normal Paps. 2006 Ductal carcinoma in situ,  left breast lumpectomy with radiation. Normal mammograms after. Uses small amount of vaginal estrogen approved by oncologist. Osteopenia T score -1.5 hip and spine FRAX 5.9%/0.4%. Current on vaccines. Has not had a colonoscopy.  Past medical history, past surgical history, family history and social history were all reviewed and documented in the EPIC chart. Works at the [employer]. 2 children and 5 grandchildren all doing well.  ROS:  A ROS was performed and pertinent positives and negatives are included.  Exam:  Filed Vitals:   02/15/15 1421  BP: 124/78    General appearance:  Normal Thyroid:  Symmetrical, normal in size, without palpable masses or nodularity. Respiratory  Auscultation:  Clear without wheezing or rhonchi Cardiovascular  Auscultation:  Regular rate, without rubs, murmurs or gallops  Edema/varicosities:  Not grossly evident Abdominal  Soft,nontender, without masses, guarding or rebound.  Liver/spleen:  No organomegaly noted  Hernia:  None appreciated  Skin  Inspection:  Grossly normal   Breasts: Examined lying and sitting.     Right: Without masses, retractions, discharge or axillary adenopathy.     Left: Without masses, retractions, discharge or axillary adenopathy. Gentitourinary   Inguinal/mons:  Normal without inguinal adenopathy  External genitalia:  Normal  BUS/Urethra/Skene's glands:  Normal  Vagina:  Normal  Cervix: And uterus absent  Adnexa/parametria:     Rt: Without masses or tenderness.   Lt: Without masses or tenderness.  Anus and perineum: Normal  Digital rectal exam: Normal sphincter tone without palpated masses or tenderness  Assessment/Plan:  64 y.o. M WF G2 P2 for annual exam with no complaints.  TVH on vaginal estrogen 2006 Left breast ductal carcinoma in situ Osteopenia without  elevated FRAX Hypertension-primary care manages labs and meds  Plan: Estradiol vaginal cream 0.1% prescription, proper use given and reviewed slight risk for systemic absorption uses small amount aware of risks. Encouraged vaginal lubricants. SBE's, continue annual screening mammogram 3-D tomography reviewed and encouraged. Exercise, calcium rich diet, decreasing calories for weight loss encouraged. Vitamin D 2000 daily encouraged. Screening colonoscopy discussed, [facility] information given and reviewed. Home safety, fall prevention and importance of weightbearing exercise for bone health reviewed. Declines repeating DEXA until next year. UA, Pap screening guidelines reviewed.  Claire Bernard [facility], 4:22 PM 02/15/2015

## 2015-02-16 LAB — URINALYSIS W MICROSCOPIC + REFLEX CULTURE
BACTERIA UA: NONE SEEN
Bilirubin Urine: NEGATIVE
CRYSTALS: NONE SEEN
Casts: NONE SEEN
GLUCOSE, UA: NEGATIVE mg/dL
Hgb urine dipstick: NEGATIVE
Ketones, ur: NEGATIVE mg/dL
Leukocytes, UA: NEGATIVE
NITRITE: NEGATIVE
PH: 5.5 (ref 5.0–8.0)
Protein, ur: NEGATIVE mg/dL
Specific Gravity, Urine: 1.008 (ref 1.005–1.030)
Squamous Epithelial / LPF: NONE SEEN
Urobilinogen, UA: 0.2 mg/dL (ref 0.0–1.0)

## 2015-02-18 ENCOUNTER — Other Ambulatory Visit: Payer: Self-pay | Admitting: Obstetrics and Gynecology

## 2015-02-19 ENCOUNTER — Other Ambulatory Visit: Payer: Self-pay

## 2015-10-10 ENCOUNTER — Other Ambulatory Visit: Payer: Self-pay | Admitting: Women's Health

## 2015-10-11 ENCOUNTER — Other Ambulatory Visit: Payer: Self-pay | Admitting: Women's Health

## 2016-12-11 ENCOUNTER — Other Ambulatory Visit: Payer: Self-pay | Admitting: Family Medicine

## 2016-12-11 DIAGNOSIS — Z1231 Encounter for screening mammogram for malignant neoplasm of breast: Secondary | ICD-10-CM

## 2017-01-05 ENCOUNTER — Ambulatory Visit
Admission: RE | Admit: 2017-01-05 | Discharge: 2017-01-05 | Disposition: A | Discharge status: Home / Self Care | Payer: Medicare Other | Source: Ambulatory Visit | Attending: Family Medicine | Admitting: Family Medicine

## 2017-01-05 DIAGNOSIS — Z1231 Encounter for screening mammogram for malignant neoplasm of breast: Secondary | ICD-10-CM

## 2017-01-05 HISTORY — DX: Personal history of irradiation: Z92.3

## 2017-01-13 ENCOUNTER — Encounter: Payer: Self-pay | Admitting: Gynecology

## 2019-04-07 ENCOUNTER — Other Ambulatory Visit: Payer: Self-pay | Admitting: Family Medicine

## 2019-04-07 DIAGNOSIS — Z1231 Encounter for screening mammogram for malignant neoplasm of breast: Secondary | ICD-10-CM

## 2019-06-07 ENCOUNTER — Other Ambulatory Visit: Payer: Self-pay

## 2019-06-07 ENCOUNTER — Ambulatory Visit
Admission: RE | Admit: 2019-06-07 | Discharge: 2019-06-07 | Disposition: A | Discharge status: Home / Self Care | Payer: Medicare Other | Source: Ambulatory Visit | Attending: Family Medicine | Admitting: Family Medicine

## 2019-06-07 DIAGNOSIS — Z1231 Encounter for screening mammogram for malignant neoplasm of breast: Secondary | ICD-10-CM

## 2019-10-03 ENCOUNTER — Other Ambulatory Visit: Payer: Self-pay

## 2019-10-04 ENCOUNTER — Encounter: Payer: Self-pay | Admitting: Obstetrics and Gynecology

## 2019-10-04 ENCOUNTER — Ambulatory Visit: Payer: Medicare Other | Admitting: Obstetrics and Gynecology

## 2019-10-04 VITALS — BP 124/80 | Ht 65.5 in | Wt 195.0 lb

## 2019-10-04 DIAGNOSIS — Z86000 Personal history of in-situ neoplasm of breast: Secondary | ICD-10-CM | POA: Diagnosis not present

## 2019-10-04 DIAGNOSIS — M8580 Other specified disorders of bone density and structure, unspecified site: Secondary | ICD-10-CM | POA: Diagnosis not present

## 2019-10-04 DIAGNOSIS — Z1272 Encounter for screening for malignant neoplasm of vagina: Secondary | ICD-10-CM

## 2019-10-04 DIAGNOSIS — Z01419 Encounter for gynecological examination (general) (routine) without abnormal findings: Secondary | ICD-10-CM

## 2019-10-04 NOTE — Progress Notes (Signed)
Claire Bernard [facility] 08-06-1951 [id number]  SUBJECTIVE:  68 y.o. G2P2002 female here to reestablish care for annual routine gynecologic exam and Pap smear.  She has no gynecologic concerns.  She was having a lot of vaginal discomfort and was seen by he primary care doctor and given vaginal estrace cream, which she has tapered off to using 1-3 times per week with great improvement in symptoms.   Current Outpatient Medications  Medication Sig Dispense Refill  . acetaminophen (TYLENOL) 650 MG CR tablet Take 650 mg by mouth every 8 (eight) hours as needed for pain.    . cetirizine (ZYRTEC) 10 MG chewable tablet Chew 10 mg by mouth daily.    Marland Kitchen estradiol (ESTRACE VAGINAL) 0.1 MG/GM vaginal cream INSERT ONE-HALF GRAM INTRAVAGINALLY THREE TIMES A WEEK AT BEDTIME 43 g 7  . hydrochlorothiazide (HYDRODIURIL) 25 MG tablet Take 25 mg by mouth daily.    . hydrOXYzine (ATARAX/VISTARIL) 25 MG tablet Take 25 mg by mouth 3 (three) times daily as needed.    Marland Kitchen lisinopril (ZESTRIL) 20 MG tablet Take 20 mg by mouth daily.    . traZODone (DESYREL) 150 MG tablet Take by mouth at bedtime.     No current facility-administered medications for this visit.   Allergies: Caine-1 [lidocaine hcl] and Procaine hcl  No LMP recorded. Patient has had a hysterectomy.  Past medical history,surgical history, problem list, medications, allergies, family history and social history were all reviewed and documented as reviewed in the EPIC chart.  ROS:  Feeling well. No dyspnea or chest pain on exertion.  No abdominal pain, change in bowel habits, black or bloody stools.  No urinary tract symptoms. GYN ROS:  no abnormal bleeding, pelvic pain or discharge, no breast pain or new or enlarging lumps on self exam. No neurological complaints.    OBJECTIVE:  BP 124/80   Ht 5' 5.5" (1.664 m)   Wt 195 lb (88.5 kg)   BMI 31.96 kg/m  The patient appears well, alert, oriented x 3, in no distress. ENT normal.  Neck supple. No  cervical or supraclavicular adenopathy or thyromegaly.  Lungs are clear, good air entry, no wheezes, rhonchi or rales. S1 and S2 normal, no murmurs, regular rate and rhythm.  Abdomen soft without tenderness, guarding, mass or organomegaly.  Neurological is normal, no focal findings.  BREAST EXAM: breasts appear normal, no suspicious masses, no skin or nipple changes or axillary nodes  PELVIC EXAM: VULVA: normal appearing vulva with no masses, tenderness or lesions, VAGINA: normal appearing vagina with normal color and discharge, no lesions, CERVIX: surgically absent, UTERUS: surgically absent, vaginal cuff well healed, ADNEXA: normal adnexa in size, nontender and no masses, RECTAL: normal exam, no masses Chaperone: Caryn Bee present during the examination  ASSESSMENT:  69 y.o. [id number] here for annual gynecologic exam  PLAN:   1. Postmenopausal.   History of TVH back in 1977 for benign indications.  Retained ovaries.  Using vaginal estrace cream a few times per week with good control of symptoms prescribed by her primary care doctor.  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  No vasomotor symptoms. 2. Pap smear 11/2010. Pap smear of vaginal cuff repeated today. No prior history of abnormal Pap smears per her recollection. 3. History of left DCIS s/p lumpectomy in 2005.  Mammogram 06/2019. Will continue with annual mammography.  Breast exam normal today. 4. Colonoscopy never done.   Recommended that she pursue getting this done, and we can provide contact information to facilitate this. 5. DEXA 12/2010 indicated low bone mass at that time.  I recommended a repeat DEXA scan which she will schedule.  Discussed importance of vitamin D and calcium supplementation. 6. Health maintenance.  No lab work as she has this completed with her  primary care provider.    Return annually or sooner, prn.  Joseph Pierini MD  10/04/19

## 2019-10-04 NOTE — Patient Instructions (Signed)
We will plan to repeat the DEXA scan this year so please schedule when convenient for you.  Continue weight bearing exercise, and vitamin D/calcium intake.

## 2019-10-11 LAB — PAP IG W/ RFLX HPV ASCU

## 2019-10-25 ENCOUNTER — Other Ambulatory Visit: Payer: Self-pay

## 2019-10-26 ENCOUNTER — Ambulatory Visit (INDEPENDENT_AMBULATORY_CARE_PROVIDER_SITE_OTHER): Payer: Medicare Other

## 2019-10-26 DIAGNOSIS — Z01419 Encounter for gynecological examination (general) (routine) without abnormal findings: Secondary | ICD-10-CM

## 2019-10-26 DIAGNOSIS — Z780 Asymptomatic menopausal state: Secondary | ICD-10-CM

## 2019-10-26 DIAGNOSIS — M8589 Other specified disorders of bone density and structure, multiple sites: Secondary | ICD-10-CM | POA: Diagnosis not present

## 2019-10-26 DIAGNOSIS — M8580 Other specified disorders of bone density and structure, unspecified site: Secondary | ICD-10-CM

## 2019-10-27 ENCOUNTER — Other Ambulatory Visit: Payer: Self-pay | Admitting: Obstetrics & Gynecology

## 2019-10-27 ENCOUNTER — Other Ambulatory Visit: Payer: Self-pay | Admitting: Obstetrics and Gynecology

## 2019-10-27 DIAGNOSIS — Z780 Asymptomatic menopausal state: Secondary | ICD-10-CM

## 2019-10-27 DIAGNOSIS — M8589 Other specified disorders of bone density and structure, multiple sites: Secondary | ICD-10-CM

## 2020-04-25 ENCOUNTER — Telehealth: Payer: Self-pay | Admitting: *Deleted

## 2020-04-25 NOTE — Telephone Encounter (Signed)
Amy from [place] office called requesting copy of bone density report, copy faxed to [phone].

## 2021-06-04 ENCOUNTER — Other Ambulatory Visit: Payer: Self-pay

## 2021-06-04 ENCOUNTER — Encounter: Payer: Self-pay | Admitting: Obstetrics and Gynecology

## 2021-06-04 ENCOUNTER — Ambulatory Visit: Payer: Medicare Other | Admitting: Obstetrics and Gynecology

## 2021-06-04 VITALS — BP 134/76 | HR 82 | Resp 12 | Ht 64.0 in | Wt 189.4 lb

## 2021-06-04 DIAGNOSIS — N763 Subacute and chronic vulvitis: Secondary | ICD-10-CM

## 2021-06-04 DIAGNOSIS — N3090 Cystitis, unspecified without hematuria: Secondary | ICD-10-CM | POA: Diagnosis not present

## 2021-06-04 LAB — WET PREP FOR TRICH, YEAST, CLUE

## 2021-06-04 MED ORDER — SULFAMETHOXAZOLE-TRIMETHOPRIM 800-160 MG PO TABS
1.0000 | ORAL_TABLET | Freq: Two times a day (BID) | ORAL | 0 refills | Status: DC
Start: 2021-06-04 — End: 2021-10-29

## 2021-06-04 MED ORDER — PHENAZOPYRIDINE HCL 200 MG PO TABS: 200.0000 mg | ORAL_TABLET | Freq: Three times a day (TID) | ORAL | 0 refills | Status: DC

## 2021-06-04 MED ORDER — BETAMETHASONE VALERATE 0.1 % EX OINT: 1.0000 "application " | TOPICAL_OINTMENT | Freq: Two times a day (BID) | CUTANEOUS | 0 refills | Status: DC

## 2021-06-04 NOTE — Patient Instructions (Signed)
Urinary Tract Infection, Adult A urinary tract infection (UTI) is an infection of any part of the urinary tract. The urinary tract includes the kidneys, ureters, bladder, and urethra. These organs make, store, and get rid of urine in the body. An upper UTI affects the ureters and kidneys. A lower UTI affects the bladder and urethra. What are the causes? Most urinary tract infections are caused by bacteria in your genital area around your urethra, where urine leaves your body. These bacteria grow and cause inflammation of your urinary tract. What increases the risk? You are more likely to develop this condition if: You have a urinary catheter that stays in place. You are not able to control when you urinate or have a bowel movement (incontinence). You are female and you: Use a spermicide or diaphragm for birth control. Have low estrogen levels. Are pregnant. You have certain genes that increase your risk. You are sexually active. You take antibiotic medicines. You have a condition that causes your flow of urine to slow down, such as: An enlarged prostate, if you are female. Blockage in your urethra. A kidney stone. A nerve condition that affects your bladder control (neurogenic bladder). Not getting enough to drink, or not urinating often. You have certain medical conditions, such as: Diabetes. A weak disease-fighting system (immunesystem). Sickle cell disease. Gout. Spinal cord injury. What are the signs or symptoms? Symptoms of this condition include: Needing to urinate right away (urgency). Frequent urination. This may include small amounts of urine each time you urinate. Pain or burning with urination. Blood in the urine. Urine that smells bad or unusual. Trouble urinating. Cloudy urine. Vaginal discharge, if you are female. Pain in the abdomen or the lower back. You may also have: Vomiting or a decreased appetite. Confusion. Irritability or tiredness. A fever or  chills. Diarrhea. The first symptom in older adults may be confusion. In some cases, they may not have any symptoms until the infection has worsened. How is this diagnosed? This condition is diagnosed based on your medical history and a physical exam. You may also have other tests, including: Urine tests. Blood tests. Tests for STIs (sexually transmitted infections). If you have had more than one UTI, a cystoscopy or imaging studies may be done to determine the cause of the infections. How is this treated? Treatment for this condition includes: Antibiotic medicine. Over-the-counter medicines to treat discomfort. Drinking enough water to stay hydrated. If you have frequent infections or have other conditions such as a kidney stone, you may need to see a health care provider who specializes in the urinary tract (urologist). In rare cases, urinary tract infections can cause sepsis. Sepsis is a life-threatening condition that occurs when the body responds to an infection. Sepsis is treated in the hospital with IV antibiotics, fluids, and other medicines. Follow these instructions at home: Medicines Take over-the-counter and prescription medicines only as told by your health care provider. If you were prescribed an antibiotic medicine, take it as told by your health care provider. Do not stop using the antibiotic even if you start to feel better. General instructions Make sure you: Empty your bladder often and completely. Do not hold urine for long periods of time. Empty your bladder after sex. Wipe from front to back after urinating or having a bowel movement if you are female. Use each tissue only one time when you wipe. Drink enough fluid to keep your urine pale yellow. Keep all follow-up visits. This is important. Contact a health care provider   if: Your symptoms do not get better after 1-2 days. Your symptoms go away and then return. Get help right away if: You have severe pain in your  back or your lower abdomen. You have a fever or chills. You have nausea or vomiting. Summary A urinary tract infection (UTI) is an infection of any part of the urinary tract, which includes the kidneys, ureters, bladder, and urethra. Most urinary tract infections are caused by bacteria in your genital area. Treatment for this condition often includes antibiotic medicines. If you were prescribed an antibiotic medicine, take it as told by your health care provider. Do not stop using the antibiotic even if you start to feel better. Keep all follow-up visits. This is important. This information is not intended to replace advice given to you by your health care provider. Make sure you discuss any questions you have with your health care provider. Document Revised: 03/29/2020 Document Reviewed: 03/29/2020 Elsevier Patient Education  2022 Elsevier Inc.  

## 2021-06-04 NOTE — Progress Notes (Addendum)
GYNECOLOGY  VISIT   HPI: 70 y.o.   Married White or Caucasian Not Hispanic or Latino  female   [phone] with No LMP recorded. Patient has had a hysterectomy.   here for vaginal swelling and burning with urination. She was seen in mid September at urgent care and was treated for a UTI with macrobid (+nitrates), was also given diflucan for possible yeast infection (no exam). She got better after about 48 hours.  By 05/26/21 her symptoms started again. She is having urinary frequency, urgency, bladder spasms, and voiding small amounts. No dysuria. Some urge incontinence. She also has vulvar swelling, minimally itchy, feels moister than normal. No abnormal d/c.  Mild lower back and lower abdominal discomfort. No fevers.  She feels like she needs to pass a golf ball from her vagina.   After taking the antibiotics she got itching in her lower legs and blisters on her lips (she has a h/o fever blisters). She thinks it may be unrelated to the antibiotic, has happened before.   She uses vaginal estrogen 1 x a week.    GYNECOLOGIC HISTORY: No LMP recorded. Patient has had a hysterectomy. Contraception:hysterectomy Menopausal hormone therapy: estradiol cream        OB History     Gravida  2   Para  2   Term  2   Preterm      AB      Living  2      SAB      IAB      Ectopic      Multiple      Live Births                 Patient Active Problem List   Diagnosis Date Noted   History of ductal carcinoma in situ (DCIS) of breast 10/04/2019   Low bone mass    Hyperlipidemia    Hypertension    Fatigue    Chest pressure    History of dizziness    Esophageal spasm    GERD (gastroesophageal reflux disease)    Laryngitis     Past Medical History:  Diagnosis Date   Cancer ([place]) 07/2004   breast cancer   Chest pressure    Clotting disorder ([place]) 1970's   from birthcontrol    Esophageal spasm    Fatigue    GERD (gastroesophageal reflux disease)    History of dizziness     Hyperlipidemia    Hypertension    Laryngitis    Low bone mass    Personal history of radiation therapy 07/2004   left breast    Past Surgical History:  Procedure Laterality Date   ANKLE FRACTURE SURGERY     BREAST LUMPECTOMY  07/17/2004   left breast   cataract surg     KNEE SURGERY     Arthroscopic   SHOULDER SURGERY     VAGINAL HYSTERECTOMY      Current Outpatient Medications  Medication Sig Dispense Refill   busPIRone (BUSPAR) 7.5 MG tablet Take 7.5 mg by mouth 2 (two) times daily. 1.5 tablets     cetirizine (ZYRTEC) 10 MG tablet Take 10 mg by mouth daily.     estradiol (ESTRACE VAGINAL) 0.1 MG/GM vaginal cream INSERT ONE-HALF GRAM INTRAVAGINALLY THREE TIMES A WEEK AT BEDTIME (Patient taking differently: Place 1 Applicatorful vaginally 3 (three) times a week. BEDTIME) 43 g 7   hydrochlorothiazide (HYDRODIURIL) 25 MG tablet Take 25 mg by mouth daily.     hydrOXYzine (  ATARAX/VISTARIL) 25 MG tablet Take 25 mg by mouth 3 (three) times daily as needed.     lisinopril (ZESTRIL) 20 MG tablet Take 20 mg by mouth daily.     naproxen (NAPROSYN) 500 MG tablet Take 500 mg by mouth 2 (two) times daily with a meal.     traZODone (DESYREL) 150 MG tablet as directed.      No current facility-administered medications for this visit.     ALLERGIES: Caine-1 [lidocaine hcl] and Procaine hcl  Family History  Problem Relation Age of Onset   Atrial fibrillation Father    Emphysema Father    Heart attack Father        muliple   Heart disease Father    Atrial fibrillation Maternal Grandfather    Heart disease Maternal Grandfather    Hypertension Maternal Grandfather    Atrial fibrillation Maternal Grandmother    Diabetes Maternal Grandmother    Heart disease Maternal Grandmother    Hypertension Maternal Grandmother    Breast cancer Paternal Grandmother        Age 10's   Cancer Paternal Grandmother        Uterine or Ovarian cancer   Hypertension Sister    Depression Brother     Heart disease Brother    Coronary artery disease Brother    Stroke Sister        x6 strokes is 70 y/o   Asthma Sister    Diabetes Sister    Breast cancer Paternal Aunt        Age 77's   Colon cancer Paternal Uncle    Heart disease Paternal Grandfather    Hypertension Paternal Grandfather    Atrial fibrillation Paternal Grandfather     Social History   Socioeconomic History   Marital status: Married    Spouse name: Not on file   Number of children: 2   Years of education: Not on file   Highest education level: Not on file  Occupational History    Employer: [id number]  Tobacco Use   Smoking status: Former    Packs/day: 0.50    Years: 20.00    Pack years: 10.00    Types: Cigarettes    Quit date: 08/31/1990    Years since quitting: 30.7   Smokeless tobacco: Never  Vaping Use   Vaping Use: Never used  Substance and Sexual Activity   Alcohol use: Yes    Alcohol/week: 0.0 standard drinks    Comment: Rare   Drug use: No   Sexual activity: Yes    Birth control/protection: Post-menopausal, Surgical    Comment: 1st intercourse 70 yo-Fewer than 5 partners  Other Topics Concern   Not on file  Social History Narrative   Not on file   Social Determinants of Health   Financial Resource Strain: Not on file  Food Insecurity: Not on file  Transportation Needs: Not on file  Physical Activity: Not on file  Stress: Not on file  Social Connections: Not on file  Intimate Partner Violence: Not on file    Review of Systems  Genitourinary:  Positive for dysuria.       Vaginal swelling   All other systems reviewed and are negative.  PHYSICAL EXAMINATION:    BP 134/76 (BP Location: Left Arm, Patient Position: Sitting, Cuff Size: Normal)   Pulse 82   Resp 12   Ht 5\' 4"  (1.626 m)   Wt 189 lb 6.4 oz (85.9 kg)   BMI 32.51 kg/m     General  appearance: alert, cooperative and appears stated age Abdomen: soft, mild lower abdominal discomfort in the SP region. No masses. CVA: not  tender Right groin is erythematous, no symptoms   Pelvic: External genitalia:  mild erythema, mild whitening, some loss of architecture of labial minora              Urethra:  normal appearing urethra with no masses, tenderness or lesions              Bartholins and Skenes: normal                 Vagina: normal appearing vagina with normal color and discharge, no lesions              Cervix: absent              Bimanual Exam:  Uterus:  uterus absent              Adnexa: no mass, fullness, tenderness              Bladder: mildly tender  Chaperone was present for exam.  1. Cystitis - Urinalysis,Complete w/RFL Culture - sulfamethoxazole-trimethoprim (BACTRIM DS) 800-160 MG tablet; Take 1 tablet by mouth 2 (two) times daily. One PO BID x 3 days  Dispense: 6 tablet; Refill: 0 - phenazopyridine (PYRIDIUM) 200 MG tablet; Take 1 tablet (200 mg total) by mouth 3 (three) times daily with meals.  Dispense: 6 tablet; Refill: 0  2. Subacute vulvitis - betamethasone valerate ointment (VALISONE) 0.1 %; Apply 1 application topically 2 (two) times daily. Use for up to 2 weeks as needed  Dispense: 30 g; Refill: 0 - WET PREP FOR TRICH, YEAST, CLUE: negative - SureSwab Advanced Vaginitis, TMA  Addendum: the patient reports normal renal function.

## 2021-06-06 LAB — SURESWAB® ADVANCED VAGINITIS,TMA
CANDIDA SPECIES: NOT DETECTED
Candida glabrata: NOT DETECTED
SURESWAB(R) ADV BACTERIAL VAGINOSIS(BV),TMA: POSITIVE — AB
TRICHOMONAS VAGINALIS (TV),TMA: NOT DETECTED

## 2021-06-07 LAB — URINALYSIS, COMPLETE W/RFL CULTURE
Bilirubin Urine: NEGATIVE
Glucose, UA: NEGATIVE
Hgb urine dipstick: NEGATIVE
Hyaline Cast: NONE SEEN /LPF
Ketones, ur: NEGATIVE
Nitrites, Initial: NEGATIVE
Protein, ur: NEGATIVE
RBC / HPF: NONE SEEN /HPF (ref 0–2)
Specific Gravity, Urine: 1.02 (ref 1.001–1.035)
pH: 5.5 (ref 5.0–8.0)

## 2021-06-07 LAB — URINE CULTURE
MICRO NUMBER:: 12464153
SPECIMEN QUALITY:: ADEQUATE

## 2021-06-07 LAB — CULTURE INDICATED

## 2021-06-09 ENCOUNTER — Other Ambulatory Visit: Payer: Self-pay

## 2021-06-09 MED ORDER — METRONIDAZOLE 500 MG PO TABS: 500.0000 mg | ORAL_TABLET | Freq: Two times a day (BID) | ORAL | 0 refills | Status: DC

## 2021-09-16 ENCOUNTER — Other Ambulatory Visit: Payer: Self-pay | Admitting: Family Medicine

## 2021-09-16 DIAGNOSIS — Z1231 Encounter for screening mammogram for malignant neoplasm of breast: Secondary | ICD-10-CM

## 2021-09-18 ENCOUNTER — Ambulatory Visit: Payer: Medicare Other

## 2021-10-10 ENCOUNTER — Other Ambulatory Visit: Payer: Self-pay

## 2021-10-10 ENCOUNTER — Ambulatory Visit
Admission: RE | Admit: 2021-10-10 | Discharge: 2021-10-10 | Disposition: A | Discharge status: Home / Self Care | Payer: Medicare Other | Source: Ambulatory Visit | Attending: Family Medicine | Admitting: Family Medicine

## 2021-10-10 DIAGNOSIS — Z1231 Encounter for screening mammogram for malignant neoplasm of breast: Secondary | ICD-10-CM

## 2021-10-13 ENCOUNTER — Other Ambulatory Visit: Payer: Self-pay | Admitting: Family Medicine

## 2021-10-13 DIAGNOSIS — R928 Other abnormal and inconclusive findings on diagnostic imaging of breast: Secondary | ICD-10-CM

## 2021-10-29 ENCOUNTER — Encounter: Payer: Self-pay | Admitting: Obstetrics and Gynecology

## 2021-10-29 ENCOUNTER — Other Ambulatory Visit: Payer: Self-pay

## 2021-10-29 ENCOUNTER — Ambulatory Visit: Payer: Medicare Other | Admitting: Obstetrics and Gynecology

## 2021-10-29 VITALS — BP 110/62 | HR 65 | Wt 191.0 lb

## 2021-10-29 DIAGNOSIS — N760 Acute vaginitis: Secondary | ICD-10-CM | POA: Diagnosis not present

## 2021-10-29 DIAGNOSIS — N941 Unspecified dyspareunia: Secondary | ICD-10-CM

## 2021-10-29 LAB — WET PREP FOR TRICH, YEAST, CLUE

## 2021-10-29 MED ORDER — BETAMETHASONE VALERATE 0.1 % EX OINT: 1.0000 "application " | TOPICAL_OINTMENT | Freq: Two times a day (BID) | CUTANEOUS | 0 refills | Status: DC

## 2021-10-29 NOTE — Progress Notes (Signed)
GYNECOLOGY  VISIT ?  ?HPI: ?71 y.o.   Married White or Caucasian Not Hispanic or Latino  female   ?[id number] with No LMP recorded. Patient has had a hysterectomy.   ?here for vaginal burning and odor, gradually worsening for months. She denies discharge.  ?Extremely painful to have intercourse, she has burning and discomfort afterwards. She was treated for BV, symptoms improved and sex didn't hurt, then after a few weeks symptoms started again.  ?She his on vaginal estrogen, using 1 gram a week.  ?She doesn't have issues with being dry.  ? ?GYNECOLOGIC HISTORY: ?No LMP recorded. Patient has had a hysterectomy. ?Contraception:pmp  ?Menopausal hormone therapy: estradiol cream  ?       ?OB History   ? ? Gravida  ?2  ? Para  ?2  ? Term  ?2  ? Preterm  ?   ? AB  ?   ? Living  ?2  ?  ? ? SAB  ?   ? IAB  ?   ? Ectopic  ?   ? Multiple  ?   ? Live Births  ?   ?   ?  ?  ?    ? ?Patient Active Problem List  ? Diagnosis Date Noted  ? History of ductal carcinoma in situ (DCIS) of breast 10/04/2019  ? Low bone mass   ? Hyperlipidemia   ? Hypertension   ? Fatigue   ? Chest pressure   ? History of dizziness   ? Esophageal spasm   ? GERD (gastroesophageal reflux disease)   ? Laryngitis   ? ? ?Past Medical History:  ?Diagnosis Date  ? Cancer [facility]) 07/2004  ? breast cancer  ? Chest pressure   ? Clotting disorder ([place]) 1970's  ? from birthcontrol   ? Esophageal spasm   ? Fatigue   ? GERD (gastroesophageal reflux disease)   ? History of dizziness   ? Hyperlipidemia   ? Hypertension   ? Laryngitis   ? Low bone mass   ? Personal history of radiation therapy 07/2004  ? left breast  ? ? ?Past Surgical History:  ?Procedure Laterality Date  ? ANKLE FRACTURE SURGERY    ? BREAST LUMPECTOMY  07/17/2004  ? left breast  ? cataract surg    ? KNEE SURGERY    ? Arthroscopic  ? SHOULDER SURGERY    ? VAGINAL HYSTERECTOMY    ? ? ?Current Outpatient Medications  ?Medication Sig Dispense Refill  ? betamethasone valerate ointment (VALISONE) 0.1 % Apply 1  application topically 2 (two) times daily. Use for up to 2 weeks as needed 30 g 0  ? busPIRone (BUSPAR) 7.5 MG tablet Take 7.5 mg by mouth 2 (two) times daily. 1.5 tablets    ? cetirizine (ZYRTEC) 10 MG tablet Take 10 mg by mouth daily.    ? estradiol (ESTRACE VAGINAL) 0.1 MG/GM vaginal cream INSERT ONE-HALF GRAM INTRAVAGINALLY THREE TIMES A WEEK AT BEDTIME (Patient taking differently: Place 1 Applicatorful vaginally 3 (three) times a week. BEDTIME) 43 g 7  ? hydrochlorothiazide (HYDRODIURIL) 25 MG tablet Take 25 mg by mouth daily.    ? hydrOXYzine (ATARAX/VISTARIL) 25 MG tablet Take 25 mg by mouth 3 (three) times daily as needed.    ? lisinopril (ZESTRIL) 20 MG tablet Take 20 mg by mouth daily.    ? metroNIDAZOLE (FLAGYL) 500 MG tablet Take 1 tablet (500 mg total) by mouth 2 (two) times daily. 14 tablet 0  ? naproxen (NAPROSYN) 500 MG  tablet Take 500 mg by mouth 2 (two) times daily with a meal.    ? traZODone (DESYREL) 150 MG tablet as directed.     ? ?No current facility-administered medications for this visit.  ?  ? ?ALLERGIES: Caine-1 [lidocaine hcl] and Procaine hcl ? ?Family History  ?Problem Relation Age of Onset  ? Atrial fibrillation Father   ? Emphysema Father   ? Heart attack Father   ?     muliple  ? Heart disease Father   ? Atrial fibrillation Maternal Grandfather   ? Heart disease Maternal Grandfather   ? Hypertension Maternal Grandfather   ? Atrial fibrillation Maternal Grandmother   ? Diabetes Maternal Grandmother   ? Heart disease Maternal Grandmother   ? Hypertension Maternal Grandmother   ? Breast cancer Paternal Grandmother   ?     Age 13's  ? Cancer Paternal Grandmother   ?     Uterine or Ovarian cancer  ? Hypertension Sister   ? Depression Brother   ? Heart disease Brother   ? Coronary artery disease Brother   ? Stroke Sister   ?     x6 strokes is 27 y/o  ? Asthma Sister   ? Diabetes Sister   ? Breast cancer Paternal Aunt   ?     Age 47's  ? Colon cancer Paternal Uncle   ? Heart disease  Paternal Grandfather   ? Hypertension Paternal Grandfather   ? Atrial fibrillation Paternal Grandfather   ? ? ?Social History  ? ?Socioeconomic History  ? Marital status: Married  ?  Spouse name: Not on file  ? Number of children: 2  ? Years of education: Not on file  ? Highest education level: Not on file  ?Occupational History  ?  Employer: [id number]  ?Tobacco Use  ? Smoking status: Former  ?  Packs/day: 0.50  ?  Years: 20.00  ?  Pack years: 10.00  ?  Types: Cigarettes  ?  Quit date: 08/31/1990  ?  Years since quitting: 31.1  ? Smokeless tobacco: Never  ?Vaping Use  ? Vaping Use: Never used  ?Substance and Sexual Activity  ? Alcohol use: Yes  ?  Alcohol/week: 0.0 standard drinks  ?  Comment: Rare  ? Drug use: No  ? Sexual activity: Yes  ?  Birth control/protection: Post-menopausal, Surgical  ?  Comment: 1st intercourse 71 yo-Fewer than 5 partners  ?Other Topics Concern  ? Not on file  ?Social History Narrative  ? Not on file  ? ?Social Determinants of Health  ? ?Financial Resource Strain: Not on file  ?Food Insecurity: Not on file  ?Transportation Needs: Not on file  ?Physical Activity: Not on file  ?Stress: Not on file  ?Social Connections: Not on file  ?Intimate Partner Violence: Not on file  ? ? ?Review of Systems  ?All other systems reviewed and are negative. ? ?PHYSICAL EXAMINATION:   ? ?BP 110/62   Pulse 65   Wt 191 lb (86.6 kg)   SpO2 99%   BMI 32.79 kg/m?     ?General appearance: alert, cooperative and appears stated age ? ? ?Pelvic: External genitalia:  no lesions, + erythema ?             Urethra:  normal appearing urethra with no masses, tenderness or lesions ?             Bartholins and Skenes: normal    ?  Vagina: mildly atrophic vagina with a slight increase in watery, white vaginal d/c, able to use a regular peterson speculum, not restricted ?             Cervix: absent ?              ?Chaperone was present for exam. ? ?1. Acute vaginitis ?- WET PREP FOR TRICH, YEAST, CLUE: negative ?-  betamethasone valerate ointment (VALISONE) 0.1 %; Apply 1 application topically 2 (two) times daily. Use for up to 2 weeks as needed  Dispense: 30 g; Refill: 0 ?- SureSwab? Advanced Vaginitis, TMA ?(If + for BV, she wants oral flagyl) ? ?2. Dyspareunia, female ?Reasonably well estrogenized ?Discussed lubrication ? ?

## 2021-10-29 NOTE — Patient Instructions (Signed)
Try uberlube for vaginal lubrication ?

## 2021-10-30 ENCOUNTER — Other Ambulatory Visit: Payer: Self-pay

## 2021-10-30 LAB — SURESWAB® ADVANCED VAGINITIS,TMA
CANDIDA SPECIES: DETECTED — AB
SURESWAB(R) ADV BACTERIAL VAGINOSIS(BV),TMA: NEGATIVE
TRICHOMONAS VAGINALIS (TV),TMA: NOT DETECTED

## 2021-10-30 LAB — SURESWAB? ADVANCED VAGINITIS,TMA: Candida glabrata: NOT DETECTED

## 2021-10-30 MED ORDER — FLUCONAZOLE 150 MG PO TABS: ORAL_TABLET | ORAL | 0 refills | Status: DC

## 2021-11-03 ENCOUNTER — Telehealth: Payer: Self-pay | Admitting: *Deleted

## 2021-11-03 NOTE — Telephone Encounter (Signed)
Patient was prescribed diflucan 150 mg tablet #2 tablet on 10/30/21. Patient took 1 pill, but lost the 2nd pill, she is still having symptoms. She asked if 1 pill could be sent to pharmacy? Please advise  ?

## 2021-11-04 MED ORDER — FLUCONAZOLE 150 MG PO TABS: 150.0000 mg | ORAL_TABLET | Freq: Once | ORAL | 0 refills | Status: AC

## 2021-11-04 NOTE — Telephone Encounter (Signed)
One tablet of diflucan sent. Please inform the patient.  ?

## 2021-11-04 NOTE — Telephone Encounter (Signed)
Patient informed. 

## 2021-11-05 ENCOUNTER — Other Ambulatory Visit: Payer: Self-pay

## 2021-11-05 ENCOUNTER — Ambulatory Visit
Admission: RE | Admit: 2021-11-05 | Discharge: 2021-11-05 | Disposition: A | Discharge status: Home / Self Care | Payer: Medicare Other | Source: Ambulatory Visit | Attending: Family Medicine | Admitting: Family Medicine

## 2021-11-05 ENCOUNTER — Other Ambulatory Visit: Payer: Self-pay | Admitting: Family Medicine

## 2021-11-05 DIAGNOSIS — R928 Other abnormal and inconclusive findings on diagnostic imaging of breast: Secondary | ICD-10-CM

## 2021-11-05 DIAGNOSIS — N6310 Unspecified lump in the right breast, unspecified quadrant: Secondary | ICD-10-CM

## 2021-11-18 ENCOUNTER — Ambulatory Visit
Admission: RE | Admit: 2021-11-18 | Discharge: 2021-11-18 | Disposition: A | Discharge status: Home / Self Care | Payer: Medicare Other | Source: Ambulatory Visit | Attending: Family Medicine | Admitting: Family Medicine

## 2021-11-18 ENCOUNTER — Other Ambulatory Visit (HOSPITAL_COMMUNITY): Payer: Self-pay | Admitting: Diagnostic Radiology

## 2021-11-18 DIAGNOSIS — N6310 Unspecified lump in the right breast, unspecified quadrant: Secondary | ICD-10-CM

## 2021-11-20 ENCOUNTER — Telehealth: Payer: Self-pay | Admitting: Hematology and Oncology

## 2021-11-20 NOTE — Telephone Encounter (Signed)
LVM for patient to return my call in reference to upcoming clinic appointment for 3/29, gave appointment time of 815, packet was mailed and emailed to patient ?

## 2021-11-24 ENCOUNTER — Telehealth: Payer: Self-pay | Admitting: Hematology and Oncology

## 2021-11-24 ENCOUNTER — Encounter: Payer: Self-pay | Admitting: *Deleted

## 2021-11-24 DIAGNOSIS — Z170 Estrogen receptor positive status [ER+]: Secondary | ICD-10-CM | POA: Insufficient documentation

## 2021-11-24 NOTE — Telephone Encounter (Signed)
Spoke to patient and she is now confirmed, she said she got my packet and my message and is good to go for upcoming clinic appointment on 3/29 at 815 ?

## 2021-11-25 NOTE — Progress Notes (Signed)
?Radiation Oncology         [phone] ?________________________________ ? ?Initial Outpatient Consultation ? ?Name: Claire Bernard MRN: [id number]  ?Date: 11/26/2021  DOB: [birth year] ? ?GG:EZMOQH, Curt Jews, MD  Coralie Keens, MD  ? ?REFERRING PHYSICIAN: Coralie Keens, MD ? ?DIAGNOSIS:  ?  ICD-10-CM   ?1. Malignant neoplasm of upper-outer quadrant of right breast in female, estrogen receptor positive ([place])  C50.411   ? Z17.0   ?  ? ? Cancer Staging  ?Malignant neoplasm of upper-outer quadrant of right breast in female, estrogen receptor positive ([place]) ?Staging form: Breast, AJCC 8th Edition ?- Clinical stage from 11/26/2021: Stage IA (cT1c, cN0, cM0, G2, ER+, PR+, HER2-) - Unsigned ? ?Right Breast UOQ Invasive Ductal Carcinoma, ER+ / PR+ / Her2-, Grade 2 ? ?CHIEF COMPLAINT: Here to discuss management of right breast cancer ? ?HISTORY OF PRESENT ILLNESS::Claire Bernard is a 71 y.o. female who presented with a right breast abnormality on the following imaging: bilateral screening mammogram on the date of 10/10/21.  No symptoms, if any, were reported at that time.   Right breast diagnostic mammogram and ultrasound on 11/05/21 further revealed a highly suspicious 1.1 cm mass involving the outer right breast at the 9 o'clock position, 4 cmfn. No pathologic right axillary lymphadenopathy was appreciated.    ? ?9 o'clock right breast biopsy on the date of 11/18/21 showed grade 2 invasive ductal carcinoma measuring 11 mm in the greatest linear extent, with microcalcifications.  ER status: 100% positive; PR status 100% positive (both with strong staining intensity); Proliferation marker Ki67 at 40%; Her2 status negative; Grade 2. No lymph nodes were examined.  ? ?She is in her usual state of health.  She received several weeks of postlumpectomy radiation therapy to the contralateral, left breast in 2005.  She is here with her husband who is very supportive. ? ?PREVIOUS RADIATION THERAPY: yes as  above ? ?PAST MEDICAL HISTORY:  has a past medical history of Cancer ([place]) (07/2004), Chest pressure, Clotting disorder ([place]) (1970's), Esophageal spasm, Fatigue, GERD (gastroesophageal reflux disease), History of dizziness, Hyperlipidemia, Hypertension, Laryngitis, Low bone mass, and Personal history of radiation therapy (07/2004).   ? ?PAST SURGICAL HISTORY: ?Past Surgical History:  ?Procedure Laterality Date  ? ANKLE FRACTURE SURGERY    ? BREAST LUMPECTOMY  07/17/2004  ? left breast  ? cataract surg    ? KNEE SURGERY    ? Arthroscopic  ? SHOULDER SURGERY    ? VAGINAL HYSTERECTOMY    ? ? ?FAMILY HISTORY: family history includes Asthma in her sister; Atrial fibrillation in her father, maternal grandfather, maternal grandmother, and paternal grandfather; Breast cancer in her paternal aunt and paternal grandmother; Cancer in her paternal grandmother; Colon cancer in her paternal uncle; Coronary artery disease in her brother; Depression in her brother; Diabetes in her maternal grandmother and sister; Emphysema in her father; Heart attack in her father; Heart disease in her brother, father, maternal grandfather, maternal grandmother, and paternal grandfather; Hypertension in her maternal grandfather, maternal grandmother, paternal grandfather, and sister; Stroke in her sister. ? ?SOCIAL HISTORY:  reports that she quit smoking about 31 years ago. Her smoking use included cigarettes. She has a 10.00 pack-year smoking history. She has never used smokeless tobacco. She reports current alcohol use. She reports that she does not use drugs. ? ?ALLERGIES: Caine-1 [lidocaine hcl] and Procaine hcl ? ?MEDICATIONS:  ?Current Outpatient Medications  ?Medication Sig Dispense Refill  ? busPIRone (BUSPAR) 5 MG tablet Take 5 mg by mouth  2 (two) times daily.    ? cetirizine (ZYRTEC) 10 MG tablet Take 10 mg by mouth daily.    ? estradiol (ESTRACE VAGINAL) 0.1 MG/GM vaginal cream INSERT ONE-HALF GRAM INTRAVAGINALLY THREE TIMES A WEEK AT  BEDTIME (Patient taking differently: Place 1 Applicatorful vaginally. Using 1 or 2 times a week) 43 g 7  ? hydrochlorothiazide (HYDRODIURIL) 25 MG tablet Take 25 mg by mouth daily.    ? lisinopril (ZESTRIL) 20 MG tablet Take 20 mg by mouth daily.    ? naproxen (NAPROSYN) 500 MG tablet Take 500 mg by mouth 2 (two) times daily with a meal.    ? traZODone (DESYREL) 150 MG tablet as directed.     ? ?No current facility-administered medications for this encounter.  ? ? ?REVIEW OF SYSTEMS: As above in HPI. ?  ?PHYSICAL EXAM:  vitals were not taken for this visit.   ?General: Alert and oriented, in no acute distress ?HEENT: Head is normocephalic. Extraocular movements are intact.  ?Heart: Regular in rate and rhythm with no murmurs, rubs, or gallops. ?Chest: Clear to auscultation bilaterally, with no rhonchi, wheezes, or rales. ?Lymphatics: see Breast exam ?Skin: No concerning lesions. ?Musculoskeletal: symmetric strength and muscle tone throughout. ?Neurologic: Cranial nerves II through XII are grossly intact. No obvious focalities. Speech is fluent. Coordination is intact. ?Psychiatric: Judgment and insight are intact. Affect is appropriate. ?Breasts: The left breast is significantly smaller than the right breast.  She has a lower inner quadrant lumpectomy scar in the left breast.  No palpable masses appreciated in the right breast.  No palpable axillary adenopathy bilaterally..  ? ? ?ECOG = 0 ? ?0 - Asymptomatic (Fully active, able to carry on all predisease activities without restriction) ? ?1 - Symptomatic but completely ambulatory (Restricted in physically strenuous activity but ambulatory and able to carry out work of a light or sedentary nature. For example, light housework, office work) ? ?2 - Symptomatic, <50% in bed during the day (Ambulatory and capable of all self care but unable to carry out any work activities. Up and about more than 50% of waking hours) ? ?3 - Symptomatic, >50% in bed, but not bedbound  (Capable of only limited self-care, confined to bed or chair 50% or more of waking hours) ? ?4 - Bedbound (Completely disabled. Cannot carry on any self-care. Totally confined to bed or chair) ? ?5 - Death ? ? Oken MM, Creech RH, Tormey DC, et al. [phone]). "Toxicity and response criteria of the [facility]". [place] Oncol. 5 (6): 649-55 ? ? ?LABORATORY DATA:  ?Lab Results  ?Component Value Date  ? WBC 8.6 11/26/2021  ? HGB 13.2 11/26/2021  ? HCT 37.2 11/26/2021  ? MCV 88.8 11/26/2021  ? PLT 252 11/26/2021  ? ?CMP  ?   ?Component Value Date/Time  ? NA 138 11/26/2021 0811  ? K 3.5 11/26/2021 0811  ? CL 105 11/26/2021 0811  ? CO2 26 11/26/2021 0811  ? GLUCOSE 125 (H) 11/26/2021 7116  ? BUN 24 (H) 11/26/2021 5790  ? CREATININE 0.68 11/26/2021 0811  ? CALCIUM 8.9 11/26/2021 0811  ? PROT 7.0 11/26/2021 0811  ? ALBUMIN 4.0 11/26/2021 0811  ? AST 11 (L) 11/26/2021 3833  ? ALT 14 11/26/2021 0811  ? ALKPHOS 105 11/26/2021 0811  ? BILITOT 0.4 11/26/2021 0811  ? GFRNONAA >60 11/26/2021 3832  ? ?   ? ?  ?RADIOGRAPHY: US BREAST LTD UNI RIGHT INC AXILLA ? ?Result Date: 11/05/2021 ?CLINICAL DATA:  Recall from screening mammography, possible mass involving the outer RIGHT breast at middle depth. Personal history of malignant LEFT breast lumpectomy in November, 2005, pathology DCIS. EXAM: DIGITAL DIAGNOSTIC UNILATERAL RIGHT MAMMOGRAM WITH TOMOSYNTHESIS AND CAD; ULTRASOUND RIGHT BREAST LIMITED TECHNIQUE: Right digital diagnostic mammography and breast tomosynthesis was performed. The images were evaluated with computer-aided detection.; Targeted ultrasound examination of the right breast was performed. COMPARISON:  Previous exam(s). ACR Breast Density Category b: There are scattered areas of fibroglandular density. FINDINGS: Spot-compression CC and MLO views of the area of concern were obtained. These confirm an irregular isoechoic mass in the outer breast at middle depth measuring just over 1 cm, associated  with architectural distortion. There are no associated suspicious calcifications. Targeted ultrasound is performed, demonstrating an irregular hypoechoic mass with vague margins at the 9 o'clock position 4 cm fr

## 2021-11-26 ENCOUNTER — Inpatient Hospital Stay: Payer: Medicare Other | Attending: Hematology and Oncology | Admitting: Hematology and Oncology

## 2021-11-26 ENCOUNTER — Ambulatory Visit
Admission: RE | Admit: 2021-11-26 | Discharge: 2021-11-26 | Disposition: A | Discharge status: Home / Self Care | Payer: Medicare Other | Source: Ambulatory Visit | Attending: Radiation Oncology | Admitting: Radiation Oncology

## 2021-11-26 ENCOUNTER — Encounter: Payer: Self-pay | Admitting: Physical Therapy

## 2021-11-26 ENCOUNTER — Encounter: Payer: Self-pay | Admitting: Hematology and Oncology

## 2021-11-26 ENCOUNTER — Inpatient Hospital Stay: Payer: Medicare Other | Admitting: Licensed Clinical Social Worker

## 2021-11-26 ENCOUNTER — Other Ambulatory Visit: Payer: Self-pay

## 2021-11-26 ENCOUNTER — Inpatient Hospital Stay: Payer: Medicare Other

## 2021-11-26 ENCOUNTER — Encounter: Payer: Self-pay | Admitting: *Deleted

## 2021-11-26 ENCOUNTER — Encounter: Payer: Self-pay | Admitting: Genetic Counselor

## 2021-11-26 ENCOUNTER — Other Ambulatory Visit: Payer: Self-pay | Admitting: Surgery

## 2021-11-26 ENCOUNTER — Encounter: Payer: Self-pay | Admitting: Radiation Oncology

## 2021-11-26 ENCOUNTER — Ambulatory Visit (HOSPITAL_BASED_OUTPATIENT_CLINIC_OR_DEPARTMENT_OTHER): Payer: Medicare Other | Admitting: Genetic Counselor

## 2021-11-26 ENCOUNTER — Ambulatory Visit: Payer: Medicare Other | Attending: Surgery | Admitting: Physical Therapy

## 2021-11-26 DIAGNOSIS — Z87891 Personal history of nicotine dependence: Secondary | ICD-10-CM

## 2021-11-26 DIAGNOSIS — Z170 Estrogen receptor positive status [ER+]: Secondary | ICD-10-CM | POA: Diagnosis not present

## 2021-11-26 DIAGNOSIS — Z86000 Personal history of in-situ neoplasm of breast: Secondary | ICD-10-CM | POA: Diagnosis not present

## 2021-11-26 DIAGNOSIS — G8929 Other chronic pain: Secondary | ICD-10-CM | POA: Diagnosis present

## 2021-11-26 DIAGNOSIS — Z923 Personal history of irradiation: Secondary | ICD-10-CM | POA: Insufficient documentation

## 2021-11-26 DIAGNOSIS — Z803 Family history of malignant neoplasm of breast: Secondary | ICD-10-CM

## 2021-11-26 DIAGNOSIS — M25511 Pain in right shoulder: Secondary | ICD-10-CM | POA: Diagnosis present

## 2021-11-26 DIAGNOSIS — Z853 Personal history of malignant neoplasm of breast: Secondary | ICD-10-CM

## 2021-11-26 DIAGNOSIS — C50411 Malignant neoplasm of upper-outer quadrant of right female breast: Secondary | ICD-10-CM | POA: Insufficient documentation

## 2021-11-26 DIAGNOSIS — Z808 Family history of malignant neoplasm of other organs or systems: Secondary | ICD-10-CM | POA: Diagnosis not present

## 2021-11-26 DIAGNOSIS — Z800 Family history of malignant neoplasm of digestive organs: Secondary | ICD-10-CM | POA: Insufficient documentation

## 2021-11-26 DIAGNOSIS — R293 Abnormal posture: Secondary | ICD-10-CM | POA: Insufficient documentation

## 2021-11-26 DIAGNOSIS — Z79899 Other long term (current) drug therapy: Secondary | ICD-10-CM | POA: Diagnosis not present

## 2021-11-26 DIAGNOSIS — M25611 Stiffness of right shoulder, not elsewhere classified: Secondary | ICD-10-CM | POA: Insufficient documentation

## 2021-11-26 LAB — GENETIC SCREENING ORDER

## 2021-11-26 LAB — CMP (CANCER CENTER ONLY)
ALT: 14 U/L (ref 0–44)
AST: 11 U/L — ABNORMAL LOW (ref 15–41)
Albumin: 4 g/dL (ref 3.5–5.0)
Alkaline Phosphatase: 105 U/L (ref 38–126)
Anion gap: 7 (ref 5–15)
BUN: 24 mg/dL — ABNORMAL HIGH (ref 8–23)
CO2: 26 mmol/L (ref 22–32)
Calcium: 8.9 mg/dL (ref 8.9–10.3)
Chloride: 105 mmol/L (ref 98–111)
Creatinine: 0.68 mg/dL (ref 0.44–1.00)
GFR, Estimated: >60 mL/min (ref >60)
Glucose, Bld: 125 mg/dL — ABNORMAL HIGH (ref 70–99)
Potassium: 3.5 mmol/L (ref 3.5–5.1)
Sodium: 138 mmol/L (ref 135–145)
Total Bilirubin: 0.4 mg/dL (ref 0.3–1.2)
Total Protein: 7 g/dL (ref 6.5–8.1)

## 2021-11-26 LAB — CBC WITH DIFFERENTIAL (CANCER CENTER ONLY)
Abs Immature Granulocytes: 0.02 10*3/uL (ref 0.00–0.07)
Basophils Absolute: 0.1 10*3/uL (ref 0.0–0.1)
Basophils Relative: 1 %
Eosinophils Absolute: 0.8 10*3/uL — ABNORMAL HIGH (ref 0.0–0.5)
Eosinophils Relative: 9 %
HCT: 37.2 % (ref 36.0–46.0)
Hemoglobin: 13.2 g/dL (ref 12.0–15.0)
Immature Granulocytes: 0 %
Lymphocytes Relative: 23 %
Lymphs Abs: 2 10*3/uL (ref 0.7–4.0)
MCH: 31.5 pg (ref 26.0–34.0)
MCHC: 35.5 g/dL (ref 30.0–36.0)
MCV: 88.8 fL (ref 80.0–100.0)
Monocytes Absolute: 0.8 10*3/uL (ref 0.1–1.0)
Monocytes Relative: 9 %
Neutro Abs: 4.9 10*3/uL (ref 1.7–7.7)
Neutrophils Relative %: 58 %
Platelet Count: 252 10*3/uL (ref 150–400)
RBC: 4.19 MIL/uL (ref 3.87–5.11)
RDW: 13.2 % (ref 11.5–15.5)
WBC Count: 8.6 10*3/uL (ref 4.0–10.5)
nRBC: 0 % (ref 0.0–0.2)

## 2021-11-26 NOTE — Progress Notes (Signed)
REFERRING PROVIDER: ?Benay Pike, MD ?[address] ?[place], [place] [address] ? ?PRIMARY PROVIDER:  ?Leonard Downing, MD ? ?PRIMARY REASON FOR VISIT:  ?1. Malignant neoplasm of upper-outer quadrant of right breast in female, estrogen receptor positive ([place])   ?2. History of ductal carcinoma in situ (DCIS) of breast   ?3. Family history of breast cancer   ? ? ?HISTORY OF PRESENT ILLNESS:   ?Claire Bernard, a 71 y.o. female, was seen for a [place] cancer genetics consultation during the breast multidisciplinary clinic at the request of Dr. Chryl Heck due to a personal and family history of cancer.  Ms. Claire Bernard presents to clinic today to discuss the possibility of a hereditary predisposition to cancer, to discuss genetic testing, and to further clarify her future cancer risks, as well as potential cancer risks for family members.  ? ?In March 2023, at the age of 49, Claire Bernard was diagnosed with invasive ductal carcinoma of the right breast. She also has a history of ductal carcinoma in situ of the left breast diagnosed at age 56. ? ?CANCER HISTORY:  ?Oncology History  ?Malignant neoplasm of upper-outer quadrant of right breast in female, estrogen receptor positive ([place])  ?10/10/2021 Imaging  ? Screening mammogram showed a possible mass in the right breast.  Diagnostic mammogram confirmed an irregular isoechoic mass in the outer breast at the middle depth measuring just over 1 cm associated with architectural distortion.  No associated suspicious calcifications.  Targeted ultrasound was performed, demonstrating an irregular hypoechoic mass with vague margins at the 9 o'clock position 4 cm from nipple at middle depth measuring approximately 1.1 x 0.9 x 1 cm demonstrating posterior acoustic shadowing and no internal power Doppler flow corresponding to the screening mammographic finding.  Right axilla demonstrated no pathologic lymphadenopathy. ?Ultrasound of the area showed highly suspicious  1.1 cm mass involving the outer right breast at 9 o'clock position 4 cm from the nipple.  No pathologic right axillary lymphadenopathy. ? ?  ?11/18/2021 Pathology Results  ? Right breast needle core biopsy 9 o'clock position showed invasive carcinoma of no special type, grade 2, 11 mm in greatest length, ER 100% positive strong staining intensity, PR 100% positive strong staining intensity, Ki-67 of 40% and HER2 negative by IHC. ?  ?11/24/2021 Initial Diagnosis  ? Malignant neoplasm of upper-outer quadrant of right breast in female, estrogen receptor positive ([place]) ?  ? ? ? ?RISK FACTORS:  ?Menarche was at age [age].  ?First live birth at age 28.  ?OCP use for approximately 6 years.  ?Ovaries intact: yes.  ?Uterus intact: no.  ?Menopausal status: postmenopausal.  ?HRT use: 0 years. ?Colonoscopy: no ?Mammogram within the last year: yes. ?Any excessive radiation exposure in the past: no ? ?Past Medical History:  ?Diagnosis Date  ? Cancer [facility]) 07/2004  ? breast cancer  ? Chest pressure   ? Clotting disorder ([place]) 1970's  ? from birthcontrol   ? Esophageal spasm   ? Fatigue   ? GERD (gastroesophageal reflux disease)   ? History of dizziness   ? Hyperlipidemia   ? Hypertension   ? Laryngitis   ? Low bone mass   ? Personal history of radiation therapy 07/2004  ? left breast  ? ? ?Past Surgical History:  ?Procedure Laterality Date  ? ANKLE FRACTURE SURGERY    ? BREAST LUMPECTOMY  07/17/2004  ? left breast  ? cataract surg    ? KNEE SURGERY    ? Arthroscopic  ? SHOULDER SURGERY    ?  VAGINAL HYSTERECTOMY    ? ? ?Social History  ? ?Socioeconomic History  ? Marital status: Married  ?  Spouse name: Not on file  ? Number of children: 2  ? Years of education: Not on file  ? Highest education level: Not on file  ?Occupational History  ?  Employer: [id number]  ?Tobacco Use  ? Smoking status: Former  ?  Packs/day: 0.50  ?  Years: 20.00  ?  Pack years: 10.00  ?  Types: Cigarettes  ?  Quit date: 08/31/1990  ?  Years since quitting: 31.2  ?  Smokeless tobacco: Never  ?Vaping Use  ? Vaping Use: Never used  ?Substance and Sexual Activity  ? Alcohol use: Yes  ?  Alcohol/week: 0.0 standard drinks  ?  Comment: Rare  ? Drug use: No  ? Sexual activity: Yes  ?  Birth control/protection: Post-menopausal, Surgical  ?  Comment: 1st intercourse 71 yo-Fewer than 5 partners  ?Other Topics Concern  ? Not on file  ?Social History Narrative  ? Not on file  ? ?Social Determinants of Health  ? ?Financial Resource Strain: Not on file  ?Food Insecurity: Not on file  ?Transportation Needs: Not on file  ?Physical Activity: Not on file  ?Stress: Not on file  ?Social Connections: Not on file  ?  ? ?FAMILY HISTORY:  ?We obtained a detailed, 4-generation family history.  Significant diagnoses are listed below: ?Family History  ?Problem Relation Age of Onset  ? Breast cancer Paternal Aunt   ?     dx. 54s  ? Colon cancer Paternal Uncle   ?     dx. 13s  ? Breast cancer Paternal Grandmother   ?     dx. 68s  ? Cancer Paternal Grandmother   ?     uterine or ovarian, unsure if breast cancer metastasis or second primary  ? ? ? ? ?Claire Bernard's paternal aunt was diagnosed with breast cancer in her 8s, she is deceased. Her paternal uncle was diagnosed with colon cancer in his 34s, he is deceased. Her paternal grandmother was diagnosed with breast cancer in her 11s and had a bilateral mastectomy as part of her treatment. She was later found to have ovarian and/or uterine cancer, she is deceased. Claire Bernard does not know if the ovarian/uterine cancer was due to breast cancer metastasis or if it was a second primary.  ? ?Claire Bernard is unaware of previous family history of genetic testing for hereditary cancer risks. There is no reported Ashkenazi Jewish ancestry.  ? ?GENETIC COUNSELING ASSESSMENT: Claire Bernard is a 71 y.o. female with a personal and family history of cancer which is somewhat suggestive of a hereditary cancer syndrome and predisposition to cancer given  her history of contralateral breast cancer. We, therefore, discussed and recommended the following at today's visit.  ? ?DISCUSSION: We discussed that 5 - 10% of cancer is hereditary, with most cases of hereditary breast cancer associated with mutations in BRCA1/2.  There are other genes that can be associated with hereditary breast cancer syndromes. Type of cancer risk and level of risk are gene-specific. We discussed that testing is beneficial for several reasons including knowing how to follow individuals after completing their treatment, identifying whether potential treatment options would be beneficial, and understanding if other family members could be at risk for cancer and allowing them to undergo genetic testing.  ? ?We reviewed the characteristics, features and inheritance patterns of hereditary cancer syndromes. We also discussed genetic testing, including  the appropriate family members to test, the process of testing, insurance coverage and turn-around-time for results. We discussed the implications of a negative, positive and/or variant of uncertain significant result. In order to get genetic test results in a timely manner so that Ms. Moravek can use these genetic test results for surgical decisions, we recommended Ms. Vitelli pursue genetic testing for the BRCAplus. Once complete, we recommend Ms. Tolbert pursue reflex genetic testing to a more comprehensive gene panel.  ? ?Ms. Texeira was offered a common hereditary cancer panel (47 genes) and an expanded pan-cancer panel (77 genes). Ms. Curet was informed of the benefits and limitations of each panel, including that expanded pan-cancer panels contain genes that do not have clear management guidelines at this point in time.  We also discussed that as the number of genes included on a panel increases, the chances of variants of uncertain significance increases.  After considering the benefits and limitations of each gene  panel, Ms. Cadena elected to have [place]. ? ?The CancerNext-Expanded gene panel offered by [facility] and includes sequencing, rearrangement, and RNA analysis for the followi

## 2021-11-26 NOTE — Research (Signed)
[employer] 2021-05 - Specimen Collection Study to Evaluate Biomarkers in Subjects with Cancer   ? ?Patient Claire Bernard was identified by Dr. Chryl Heck as a potential candidate for the above listed study.  This Clinical Research Nurse met with [name], [id number], on 11/26/21 in a manner and location that ensures patient privacy to discuss participation in the above listed research study.  Patient is Accompanied by her husband .  A copy of the informed consent document with embedded HIPAA language was provided to the patient.  Patient reads, speaks, and understands [place].   ?Patient was provided with the business card of this Nurse and encouraged to contact the research team with any questions.  Approximately 20 minutes were spent with the patient reviewing the informed consent documents.  Patient was provided the option of taking informed consent documents home to review and was encouraged to review at their convenience with their support network, including other care providers. Patient took the consent documents home to review. The nurse agreed to contact the pt on Monday, 12/01/21 at 9 am to discuss the consent form and her study participation.  The pt said that if she is agreeable with the consent form, then she will most likely sign consent on Monday afternoon, 12/01/21, before her scheduled PT appt at 2:45pm.  The pt was thanked for her interest in the study.  The pt previously participated in another research study at our site, and the nurse acknowledged the pt's support of clinical trials. ?Brion Aliment RN, BSN, CCRP ?Clinical Research Nurse Lead ?11/26/2021 12:31 PM   ?

## 2021-11-26 NOTE — Progress Notes (Signed)
[facility] Clinical Social Work  ?Initial Assessment ? ? ?Claire Bernard is a 71 y.o. year old female accompanied by spouse, Claire Bernard. Clinical Social Work was referred by  [facility]  for assessment of psychosocial needs.  ? ?SDOH (Social Determinants of Health) assessments performed: Yes ?SDOH Interventions   ? ?Flowsheet Row Most Recent Value  ?SDOH Interventions   ?Food Insecurity Interventions Intervention Not Indicated  ?Financial Strain Interventions Intervention Not Indicated  ?Housing Interventions Intervention Not Indicated  ?Transportation Interventions Intervention Not Indicated  ? ?  ?  ?Distress Screen completed: Yes ? ?  11/26/2021  ?  2:42 PM  ?ONCBCN DISTRESS SCREENING  ?Screening Type Initial Screening  ?Distress experienced in past week (1-10) 4  ?Emotional problem type Adjusting to illness;Adjusting to appearance changes  ?Physical Problem type Pain  ? ? ? ? ?Family/Social Information:  ?Housing Arrangement: patient lives with spouse ?Family members/support persons in your life? Family and Friends ?Transportation concerns: no  ?Employment: Retired. Income source: [place] ?Financial concerns: No ?Type of concern: None ?Food access concerns: no ?Religious or spiritual practice: not addressed ?Services Currently in place:  n/a ? ?Coping/ Adjustment to diagnosis: ?Patient understands treatment plan and what happens next? yes ?Concerns about diagnosis and/or treatment: I'm not especially worried about anything ?Patient reported stressors: Adjusting to my illness ?Patient enjoys being outside and time with family/ friends ?Current coping skills/ strengths: Active sense of humor , Capable of independent living , Motivation for treatment/growth , and Supportive family/friends  ? ? ? SUMMARY: ?Current SDOH Barriers:  ?No significant SDOH barriers at this time ? ?Clinical Social Work Clinical Goal(s):  ?None at this time ? ?Interventions: ?Discussed common feeling and emotions when being diagnosed  with cancer, and the importance of support during treatment ?Informed patient of the support team roles and support services at [facility] ?Provided CSW contact information and encouraged patient to call with any questions or concerns ? ? ?Follow Up Plan: Patient will contact CSW with any support or resource needs ?Patient verbalizes understanding of plan: Yes ? ? ? ?Caralee Morea E Sephiroth Mcluckie, LCSW ?

## 2021-11-26 NOTE — Therapy (Signed)
?OUTPATIENT PHYSICAL THERAPY BREAST CANCER BASELINE EVALUATION / EVAL FOR RIGHT SHOULDER ? ? ?Patient Name: Claire Bernard ?MRN: [id number] ?DOB:05/22/1951, 71 y.o., female ?Today's Date: 11/26/2021 ? ? PT End of Session - 11/26/21 1128   ? ? Visit Number 1   ? Number of Visits 9   Pt will be seen 2x/week for 4 weeks and then for 1 post op reassessment  ? Date for PT Re-Evaluation 01/21/22   ? PT Start Time 1035   ? PT Stop Time 1120   ? PT Time Calculation (min) 45 min   ? Activity Tolerance Patient tolerated treatment well   ? Behavior During Therapy [facility] for tasks assessed/performed   ? ?  ?  ? ?  ? ? ?Past Medical History:  ?Diagnosis Date  ? Cancer [facility]) 07/2004  ? breast cancer  ? Chest pressure   ? Clotting disorder ([place]) 1970's  ? from birthcontrol   ? Esophageal spasm   ? Fatigue   ? GERD (gastroesophageal reflux disease)   ? History of dizziness   ? Hyperlipidemia   ? Hypertension   ? Laryngitis   ? Low bone mass   ? Personal history of radiation therapy 07/2004  ? left breast  ? ?Past Surgical History:  ?Procedure Laterality Date  ? ANKLE FRACTURE SURGERY    ? BREAST LUMPECTOMY  07/17/2004  ? left breast  ? cataract surg    ? KNEE SURGERY    ? Arthroscopic  ? SHOULDER SURGERY    ? VAGINAL HYSTERECTOMY    ? ?Patient Active Problem List  ? Diagnosis Date Noted  ? Malignant neoplasm of upper-outer quadrant of right breast in female, estrogen receptor positive ([place]) 11/24/2021  ? History of ductal carcinoma in situ (DCIS) of breast 10/04/2019  ? Low bone mass   ? Hyperlipidemia   ? Hypertension   ? Fatigue   ? Chest pressure   ? History of dizziness   ? Esophageal spasm   ? GERD (gastroesophageal reflux disease)   ? Laryngitis   ? ? ?PCP: Leonard Downing, MD ? ?REFERRING PROVIDER: Coralie Keens, MD ? ?REFERRING DIAG: Right breast cancer and right shoulder pain with dysfunction ? ?THERAPY DIAG:  ?Malignant neoplasm of upper-outer quadrant of right breast in female, estrogen receptor positive  ([place]) ? ?Abnormal posture ? ?Stiffness of right shoulder, not elsewhere classified ? ?Chronic right shoulder pain ? ?ONSET DATE: 11/18/2021 ? ?SUBJECTIVE                                                                                                                                                                                          ? ?  SUBJECTIVE STATEMENT: ?Patient reports she is here today to be seen by her medical team for her newly diagnosed right breast cancer. She also c/o right shoulder pain that limits her function and has been present for 4 years but may interfere with her ability to get positioned for radiation. She said it started 4 years ago and has progressively gotten worse. ? ?PERTINENT HISTORY:  ?Patient was diagnosed on 10/30/2021 with right grade II invasive ductal carcinoma breast cancer. It measures 1.1 cm and is located in the upper outer quadrant. It is ER/PR positive and HER2 negative with a Ki67 of 40%. She had left breast cancer in 2005 which was treated with surgery, radiation, and anti-estrogen therapy. No axillary nodes removed per her report. ? ?PATIENT GOALS   reduce lymphedema risk and learn post op HEP. Improve right shoulder ROM and decrease pain to better tolerate radiation. ? ?PAIN:  ?Are you having pain? Yes: NPRS scale: 6/10 ?Pain location: Right shoulder and upper arm ?Pain description: sharp ?Aggravating factors: reaching up in any direction ?Relieving factors: rest and Naproxen ? ? ?PRECAUTIONS: Active CA; previous left breast cancer surgery but not at risk for lymphedema on left arm. ?HAND DOMINANCE: right ? ?WEIGHT BEARING RESTRICTIONS No ? ?FALLS:  ?Has patient fallen in last 6 months? No ? ?LIVING ENVIRONMENT: ?Patient lives with: husband ?Lives in: House/apartment ?Has following equipment at home: None ? ?OCCUPATION: retired from [employer] ? ?LEISURE: She walks 3x/week for 15 minutes ? ?PRIOR LEVEL OF FUNCTION: Independent ? ? ?OBJECTIVE ? ?COGNITION: ? Overall  cognitive status: Within functional limits for tasks assessed   ? ?POSTURE:  ?Forward head and rounded shoulders posture ? ?UPPER EXTREMITY AROM/PROM: ? ?A/PROM RIGHT  11/26/2021 ?  ?Shoulder extension 42  ?Shoulder flexion 132 PAINFUL  ?Shoulder abduction 141 PAINFUL  ?Shoulder internal rotation 65 PAINFUL  ?Shoulder external rotation 85 PAINFUL  ?  (Blank rows = not tested) ? ?A/PROM LEFT  11/26/2021  ?Shoulder extension 50  ?Shoulder flexion 150  ?Shoulder abduction 155  ?Shoulder internal rotation 83  ?Shoulder external rotation 87  ?  (Blank rows = not tested) ? ? ?CERVICAL AROM: ?All within normal limits ? ?UPPER EXTREMITY STRENGTH: Left UE 5/5; unable to test right shoulder strength due to pain ? ?PALPATION: ?Tender to palpation right anterior shoulder, upper traps, and upper lateral arm at deltoid insertion. ? ?SPECIAL TESTS: ?No shoulder special tests performed ? ? ?LYMPHEDEMA ASSESSMENTS:  ? ?[place] RIGHT  11/26/2021  ?10 cm proximal to olecranon process 29.8  ?Olecranon process 26.6  ?10 cm proximal to ulnar styloid process 22.4  ?Just proximal to ulnar styloid process 15.9  ?Across hand at thumb web space 18.4  ?At base of 2nd digit 6.3  ?(Blank rows = not tested) ? ?[place] LEFT  11/26/2021  ?10 cm proximal to olecranon process 30.1  ?Olecranon process 26.2  ?10 cm proximal to ulnar styloid process 21.8  ?Just proximal to ulnar styloid process 16.2  ?Across hand at thumb web space 18.6  ?At base of 2nd digit 6.4  ?(Blank rows = not tested) ? ? ?L-DEX LYMPHEDEMA SCREENING: ? ?The patient was assessed using the L-Dex machine today to produce a lymphedema index baseline score. The patient will be reassessed on a regular basis (typically every 3 months) to obtain new L-Dex scores. If the score is > 6.5 points away from his/her baseline score indicating onset of subclinical lymphedema, it will be recommended to wear a compression garment for 4 weeks, 12 hours per  day and then be reassessed. If the score  continues to be > 6.5 points from baseline at reassessment, we will initiate lymphedema treatment. Assessing in this manner has a 95% rate of preventing clinically significant lymphedema. ? ? L-DEX FLOWSHEETS - 11/26/21 1100   ? ?  ? L-DEX LYMPHEDEMA SCREENING  ? Measurement Type Unilateral   ? L-DEX MEASUREMENT EXTREMITY Upper Extremity   ? POSITION  Standing   ? DOMINANT SIDE Right   ? At Risk Side Right   ? BASELINE SCORE (UNILATERAL) -5.4   ? ?  ?  ? ?  ? ? ? ?QUICK DASH SURVEY: ? [name] - 11/26/21 0001   ? ? Open a tight or new jar Moderate difficulty   ? Do heavy household chores (wash walls, wash floors) Mild difficulty   ? Carry a shopping bag or briefcase Mild difficulty   ? Wash your back No difficulty   ? Use a knife to cut food No difficulty   ? Recreational activities in which you take some force or impact through your arm, shoulder, or hand (golf, hammering, tennis) Mild difficulty   ? During the past week, to what extent has your arm, shoulder or hand problem interfered with your normal social activities with family, friends, neighbors, or groups? Slightly   ? During the past week, to what extent has your arm, shoulder or hand problem limited your work or other regular daily activities Slightly   ? Arm, shoulder, or hand pain. Mild   ? Tingling (pins and needles) in your arm, shoulder, or hand Mild   ? Difficulty Sleeping Mild difficulty   ? DASH Score 22.73 %   ? ?  ?  ? ?  ? ? ? ? ? ?PATIENT EDUCATION:  ?Education details: Lymphedema risk reduction and post op shoulder/posture HEP ?Person educated: Patient ?Education method: Explanation, Demonstration, Handout ?Education comprehension: Patient verbalized understanding and returned demonstration ? ? ?HOME EXERCISE PROGRAM: ?Patient was instructed today in a home exercise program today for post op shoulder range of motion. These included active assist shoulder flexion in sitting, scapular retraction, wall walking with shoulder abduction, and hands  behind head external rotation.  She was encouraged to do these twice a day, holding 3 seconds and repeating 5 times when permitted by her physician. ? ? ?ASSESSMENT: ? ?CLINICAL IMPRESSION: ?Patient was diagnos

## 2021-11-26 NOTE — Therapy (Signed)
?OUTPATIENT PHYSICAL THERAPY TREATMENT NOTE ? ? ?Patient Name: Claire Bernard ?MRN: [id number] ?DOB:[birth year], 71 y.o., female ?Today's Date: 11/27/2021 ? ?PCP: Leonard Downing, MD ?REFERRING PROVIDER: Coralie Keens, MD ? ? PT End of Session - 11/27/21 1058   ? ? Visit Number 2   ? Number of Visits 9   ? Date for PT Re-Evaluation 01/21/22   ? PT Start Time 1100   ? PT Stop Time 1145   ? PT Time Calculation (min) 45 min   ? Activity Tolerance Patient tolerated treatment well   ? Behavior During Therapy [facility] for tasks assessed/performed   ? ?  ?  ? ?  ? ? ?Past Medical History:  ?Diagnosis Date  ? Cancer [facility]) 07/2004  ? breast cancer  ? Chest pressure   ? Clotting disorder ([place]) 1970's  ? from birthcontrol   ? Esophageal spasm   ? Fatigue   ? GERD (gastroesophageal reflux disease)   ? History of dizziness   ? Hyperlipidemia   ? Hypertension   ? Laryngitis   ? Low bone mass   ? Personal history of radiation therapy 07/2004  ? left breast  ? ?Past Surgical History:  ?Procedure Laterality Date  ? ANKLE FRACTURE SURGERY    ? BREAST LUMPECTOMY  07/17/2004  ? left breast  ? cataract surg    ? KNEE SURGERY    ? Arthroscopic  ? SHOULDER SURGERY    ? VAGINAL HYSTERECTOMY    ? ?Patient Active Problem List  ? Diagnosis Date Noted  ? Malignant neoplasm of upper-outer quadrant of right breast in female, estrogen receptor positive ([place]) 11/24/2021  ? History of ductal carcinoma in situ (DCIS) of breast 10/04/2019  ? Low bone mass   ? Hyperlipidemia   ? Hypertension   ? Fatigue   ? Chest pressure   ? History of dizziness   ? Esophageal spasm   ? GERD (gastroesophageal reflux disease)   ? Laryngitis   ? ? ?REFERRING DIAG: Right breast cancer and right shoulder pain with dysfunction and loss of ROM ? ?THERAPY DIAG:  ?Abnormal posture ? ?Stiffness of right shoulder, not elsewhere classified ? ?Chronic right shoulder pain ? ?PERTINENT HISTORY: Patient was diagnosed on 10/30/2021 with right grade II invasive ductal carcinoma  breast cancer. It measures 1.1 cm and is located in the upper outer quadrant. It is ER/PR positive and HER2 negative with a Ki67 of 40%. She had left breast cancer in 2005 which was treated with surgery, radiation, and anti-estrogen therapy. No axillary nodes removed per her report.  ? ?PRECAUTIONS: Active CA; previous left breast cancer surgery but not at risk for lymphedema on left arm. ? ?ONSET DATE: 11/18/2021  ? ?HAND DOMINANCE: right ? ?PATIENT GOALS   reduce lymphedema risk and learn post op HEP. Improve right shoulder ROM and decrease pain to better tolerate radiation.  ? ?SUBJECTIVE: My shoulder is the usual pain.  It doesn't usually hurt unless I'm actively tucking arm up under pillow at night or during the day when I have to do anything weighted with my arm. ? ?PAIN:  ?PAIN:  ?Are you having pain? Yes ?NPRS scale: 1-2/10 ?Pain location: Rt shoulder ?Pain orientation: Right  ?PAIN TYPE: aching and sharp ?Pain description: intermittent  ?Aggravating factors: sleep, use of arm with weighted tasks ?Relieving factors: adjust activity or change positions ? ?EVALUATION OBJECTIVE FINDINGS 11/26/21: ? ? ?COGNITION: ?           Overall cognitive status: Within functional  limits for tasks assessed              ?  ?POSTURE:  ?Forward head and rounded shoulders posture ?  ?UPPER EXTREMITY AROM/PROM: ?  ?A/PROM RIGHT  11/26/2021 ?   ?Shoulder extension 42  ?Shoulder flexion 132 PAINFUL  ?Shoulder abduction 141 PAINFUL  ?Shoulder internal rotation 65 PAINFUL  ?Shoulder external rotation 85 PAINFUL  ?                        (Blank rows = not tested) ?  ?A/PROM LEFT  11/26/2021  ?Shoulder extension 50  ?Shoulder flexion 150  ?Shoulder abduction 155  ?Shoulder internal rotation 83  ?Shoulder external rotation 87  ?                        (Blank rows = not tested) ?  ?  ?CERVICAL AROM: ?All within normal limits ?  ?UPPER EXTREMITY STRENGTH: Left UE 5/5; unable to test right shoulder strength due to pain ?  ?PALPATION: ?Tender  to palpation right anterior shoulder, upper traps, and upper lateral arm at deltoid insertion. ?  ?SPECIAL TESTS: ?No shoulder special tests performed ?  ?  ?LYMPHEDEMA ASSESSMENTS:  ?  ?[place] RIGHT  11/26/2021  ?10 cm proximal to olecranon process 29.8  ?Olecranon process 26.6  ?10 cm proximal to ulnar styloid process 22.4  ?Just proximal to ulnar styloid process 15.9  ?Across hand at thumb web space 18.4  ?At base of 2nd digit 6.3  ?(Blank rows = not tested) ?  ?[place] LEFT  11/26/2021  ?10 cm proximal to olecranon process 30.1  ?Olecranon process 26.2  ?10 cm proximal to ulnar styloid process 21.8  ?Just proximal to ulnar styloid process 16.2  ?Across hand at thumb web space 18.6  ?At base of 2nd digit 6.4  ?(Blank rows = not tested) ?  ?  ?L-DEX LYMPHEDEMA SCREENING: ?  ?The patient was assessed using the L-Dex machine today to produce a lymphedema index baseline score. The patient will be reassessed on a regular basis (typically every 3 months) to obtain new L-Dex scores. If the score is > 6.5 points away from his/her baseline score indicating onset of subclinical lymphedema, it will be recommended to wear a compression garment for 4 weeks, 12 hours per day and then be reassessed. If the score continues to be > 6.5 points from baseline at reassessment, we will initiate lymphedema treatment. Assessing in this manner has a 95% rate of preventing clinically significant lymphedema. ?  ?  L-DEX FLOWSHEETS - 11/26/21 1100   ?  ?    ?     ?  L-DEX LYMPHEDEMA SCREENING  ?  Measurement Type Unilateral   ?  L-DEX MEASUREMENT EXTREMITY Upper Extremity   ?  POSITION  Standing   ?  DOMINANT SIDE Right   ?  At Risk Side Right   ?  BASELINE SCORE (UNILATERAL) -5.4   ?  ?   ?  ?  ?   ?  ?  ?  ?QUICK DASH SURVEY: ?  Claire Bernard - 11/26/21 0001   ?  ?  Open a tight or new jar Moderate difficulty   ?  Do heavy household chores (wash walls, wash floors) Mild difficulty   ?  Carry a shopping bag or briefcase Mild difficulty   ?   Wash your back No difficulty   ?  Use a knife to cut  food No difficulty   ?  Recreational activities in which you take some force or impact through your arm, shoulder, or hand (golf, hammering, tennis) Mild difficulty   ?  During the past week, to what extent has your arm, shoulder or hand problem interfered with your normal social activities with family, friends, neighbors, or groups? Slightly   ?  During the past week, to what extent has your arm, shoulder or hand problem limited your work or other regular daily activities Slightly   ?  Arm, shoulder, or hand pain. Mild   ?  Tingling (pins and needles) in your arm, shoulder, or hand Mild   ?  Difficulty Sleeping Mild difficulty   ?  DASH Score 22.73 %   ?  ?   ?  ?  ?   ?  ?  ?TODAY'S TREATMENT: ?11/27/21: ?Supine moist heat Rt shoulder x 5' while discussing plan for session ?P/ROM supine flexion ?AA/ROM supine shoulder flexion with dowel x 10 (Rt shoulder reaches 150 degrees) ?Seated scapular squeeze 5x3" holds ?Seated hands behind head external rotation 5x3" holds ?Standing Rt wall slides flexion x 10 reps, PT TC for scapular mechanics which allowed painfree ROM ?Standing red tband bil row x 20 ?Standing red tband bil extension x 20 ?Standing bil red tband ER x 20 ?  ?  ?PATIENT EDUCATION:  ?Education details: Lymphedema risk reduction and post op shoulder/posture HEP, Access Code: [email or web address] ?Person educated: Patient ?Education method: Explanation, Demonstration, Handout ?Education comprehension: Patient verbalized understanding and returned demonstration ?  ?  ?HOME EXERCISE PROGRAM: ?11/27/21 ?Access Code: [email or web address] ?URL: [email or web address] ?Date: 11/27/2021 ?Prepared by: [place]ia Night [place] ? ?Exercises ?- Standing Shoulder Abduction Slides at Wall  - 1 x daily - 7 x weekly - 1 sets - 10 reps ?- Shoulder Flexion Wall Slide with Towel  - 1 x daily - 7 x weekly - 1 sets - 10 reps ?- Standing Row with Anchored Resistance  - 1 x daily - 7 x weekly - 1  sets - 15 reps ?- Standing Shoulder Extension with Resistance  - 1 x daily - 7 x weekly - 1 sets - 15 reps ?- Shoulder External Rotation and Scapular Retraction with Resistance  - 1 x daily - 7 x weekl

## 2021-11-26 NOTE — Progress Notes (Signed)
[place] ?CONSULT NOTE ? ?Patient Care Team: ?Leonard Downing, MD as PCP - General (Family Medicine) ?Coralie Keens, MD as Consulting Physician (General Surgery) ?Benay Pike, MD as Consulting Physician (Hematology and Oncology) ?Eppie Gibson, MD as Attending Physician (Radiation Oncology) ?Mauro Kaufmann, RN as Oncology Nurse Navigator ?Rockwell Germany, RN as Oncology Nurse Navigator ? ?CHIEF COMPLAINTS/PURPOSE OF CONSULTATION:  ?Newly diagnosed breast cancer ? ?HISTORY OF PRESENTING ILLNESS:  ?Claire Bernard 71 y.o. female is here because of recent diagnosis of right  ? ?I reviewed her records extensively and collaborated the history with the patient. ? ?SUMMARY OF ONCOLOGIC HISTORY: ?Oncology History  ?Malignant neoplasm of upper-outer quadrant of right breast in female, estrogen receptor positive ([place])  ?10/10/2021 Imaging  ? Screening mammogram showed a possible mass in the right breast.  Diagnostic mammogram confirmed an irregular isoechoic mass in the outer breast at the middle depth measuring just over 1 cm associated with architectural distortion.  No associated suspicious calcifications.  Targeted ultrasound was performed, demonstrating an irregular hypoechoic mass with vague margins at the 9 o'clock position 4 cm from nipple at middle depth measuring approximately 1.1 x 0.9 x 1 cm demonstrating posterior acoustic shadowing and no internal power Doppler flow corresponding to the screening mammographic finding.  Right axilla demonstrated no pathologic lymphadenopathy. ?Ultrasound of the area showed highly suspicious 1.1 cm mass involving the outer right breast at 9 o'clock position 4 cm from the nipple.  No pathologic right axillary lymphadenopathy. ? ?  ?11/18/2021 Pathology Results  ? Right breast needle core biopsy 9 o'clock position showed invasive carcinoma of no special type, grade 2, 11 mm in greatest length, ER 100% positive strong staining intensity, PR 100%  positive strong staining intensity, Ki-67 of 40% and HER2 negative by IHC. ?  ?11/24/2021 Initial Diagnosis  ? Malignant neoplasm of upper-outer quadrant of right breast in female, estrogen receptor positive ([place]) ?  ? 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  She was part of a research study at that time.  She also has family history of breast cancer and colon cancer.  She is very healthy at baseline.  She had history of blood clot back in 1970s while she was on birth control.  ?Rest of the pertinent 10 point ROS reviewed and negative. ? ?MEDICAL HISTORY:  ?Past Medical History:  ?Diagnosis Date  ? Cancer [facility]) 07/2004  ? breast cancer  ? Chest pressure   ? Clotting disorder ([place]) 1970's  ? from birthcontrol   ? Esophageal spasm   ? Fatigue   ? GERD (gastroesophageal reflux disease)   ? History of dizziness   ? Hyperlipidemia   ? Hypertension   ? Laryngitis   ? Low bone mass   ? Personal history of radiation therapy 07/2004  ? left breast  ? ? ?SURGICAL HISTORY: ?Past Surgical History:  ?Procedure Laterality Date  ? ANKLE FRACTURE SURGERY    ? BREAST LUMPECTOMY  07/17/2004  ? left breast  ? cataract surg    ? KNEE SURGERY    ? Arthroscopic  ? SHOULDER SURGERY    ? VAGINAL HYSTERECTOMY    ? ? ?SOCIAL HISTORY: ?Social History  ? ?Socioeconomic History  ? Marital status: Married  ?  Spouse name: Not on file  ? Number of children: 2  ? Years of education: Not on file  ?  Highest education level: Not on file  ?Occupational History  ?  Employer: [id number]  ?Tobacco Use  ? Smoking status: Former  ?  Packs/day: 0.50  ?  Years: 20.00  ?  Pack years: 10.00  ?  Types: Cigarettes  ?  Quit date: 08/31/1990  ?  Years since quitting: 31.2  ? Smokeless tobacco: Never  ?Vaping Use  ? Vaping Use: Never used  ?Substance and Sexual Activity  ? Alcohol use: Yes  ?  Alcohol/week: 0.0 standard drinks  ?  Comment: Rare   ? Drug use: No  ? Sexual activity: Yes  ?  Birth control/protection: Post-menopausal, Surgical  ?  Comment: 1st intercourse 71 yo-Fewer than 5 partners  ?Other Topics Concern  ? Not on file  ?Social History Narrative  ? Not on file  ? ?Social Determinants of Health  ? ?Financial Resource Strain: Not on file  ?Food Insecurity: Not on file  ?Transportation Needs: Not on file  ?Physical Activity: Not on file  ?Stress: Not on file  ?Social Connections: Not on file  ?Intimate Partner Violence: Not on file  ? ? ?FAMILY HISTORY: ?Family History  ?Problem Relation Age of Onset  ? Atrial fibrillation Father   ? Emphysema Father   ? Heart attack Father   ?     muliple  ? Heart disease Father   ? Atrial fibrillation Maternal Grandfather   ? Heart disease Maternal Grandfather   ? Hypertension Maternal Grandfather   ? Atrial fibrillation Maternal Grandmother   ? Diabetes Maternal Grandmother   ? Heart disease Maternal Grandmother   ? Hypertension Maternal Grandmother   ? Breast cancer Paternal Grandmother   ?     Age 77's  ? Cancer Paternal Grandmother   ?     Uterine or Ovarian cancer  ? Hypertension Sister   ? Depression Brother   ? Heart disease Brother   ? Coronary artery disease Brother   ? Stroke Sister   ?     x6 strokes is 12 y/o  ? Asthma Sister   ? Diabetes Sister   ? Breast cancer Paternal Aunt   ?     Age 34's  ? Colon cancer Paternal Uncle   ? Heart disease Paternal Grandfather   ? Hypertension Paternal Grandfather   ? Atrial fibrillation Paternal Grandfather   ? ? ?ALLERGIES:  is allergic to caine-1 [lidocaine hcl] and procaine hcl. ? ?MEDICATIONS:  ?Current Outpatient Medications  ?Medication Sig Dispense Refill  ? busPIRone (BUSPAR) 5 MG tablet Take 5 mg by mouth 2 (two) times daily.    ? hydrochlorothiazide (HYDRODIURIL) 25 MG tablet Take 25 mg by mouth daily.    ? lisinopril (ZESTRIL) 20 MG tablet Take 20 mg by mouth daily.    ? naproxen (NAPROSYN) 500 MG tablet Take 500 mg by mouth 2 (two) times daily with a  meal.    ? traZODone (DESYREL) 150 MG tablet as directed.     ? cetirizine (ZYRTEC) 10 MG tablet Take 10 mg by mouth daily.    ? estradiol (ESTRACE VAGINAL) 0.1 MG/GM vaginal cream INSERT ONE-HALF GRAM INTRAVAGINALLY THREE TIMES A WEEK AT BEDTIME (Patient taking differently: Place 1 Applicatorful vaginally. Using 1 or 2 times a week) 43 g 7  ? ?No current facility-administered medications for this visit.  ? ? ?REVIEW OF SYSTEMS:   ? ?Constitutional: Denies fevers, chills or abnormal night sweats ?Eyes: Denies blurriness of vision, double vision or watery eyes ?Ears, nose, mouth, throat, and face:  Denies mucositis or sore throat ?Respiratory: Denies cough, dyspnea or wheezes ?Cardiovascular: Denies palpitation, chest discomfort or lower extremity swelling ?Gastrointestinal:  Denies nausea, heartburn or change in bowel habits ?Skin: Denies abnormal skin rashes ?Lymphatics: Denies new lymphadenopathy or easy bruising ?Neurological:Denies numbness, tingling or new weaknesses ?Behavioral/Psych: Mood is stable, no new changes  ?Breast: Denies any palpable lumps or discharge ?All other systems were reviewed with the patient and are negative. ? ?PHYSICAL EXAMINATION: ?ECOG PERFORMANCE STATUS: 0 - Asymptomatic ? ?Vitals:  ? 11/26/21 0832  ?BP: 126/70  ?Pulse: 82  ?Resp: 18  ?Temp: 98.1 ?F (36.7 ?C)  ?SpO2: 97%  ? ?Filed Weights  ? 11/26/21 0832  ?Weight: 193 lb 12.8 oz (87.9 kg)  ? ? ?GENERAL:alert, no distress and comfortable ?SKIN: skin color, texture, turgor are normal, no rashes or significant lesions ?EYES: normal, conjunctiva are pink and non-injected, sclera clear ?OROPHARYNX:no exudate, no erythema and lips, buccal mucosa, and tongue normal  ?NECK: supple, thyroid normal size, non-tender, without nodularity ?LYMPH:  no palpable lymphadenopathy in the cervical, axillary or inguinal ?LUNGS: clear to auscultation and percussion with normal breathing effort ?HEART: regular rate & rhythm and no murmurs and no lower  extremity edema ?ABDOMEN:abdomen soft, non-tender and normal bowel sounds ?Musculoskeletal:no cyanosis of digits and no clubbing  ?PSYCH: alert & oriented x 3 with fluent speech ?NEURO: no focal motor/sensor

## 2021-11-26 NOTE — Assessment & Plan Note (Addendum)
This is a very pleasant 71 year old female patient with newly diagnosed right breast invasive ductal carcinoma, grade 2, 11 mm on ultrasound, prognostics ER 100% positive strong staining, PR 100% positive strong staining, Ki-67 of 40% and HER2 negative by IHC (0). ? ?Given small tumor, strong ER/PR positivity, we have discussed about upfront surgery followed by Oncotype testing.  We have discussed the following details about Oncotype testing. ? ?We have discussed about Oncotype Dx score which is a well validated prognostic scoring system which can predict outcome with endocrine therapy alone and whether chemotherapy reduces recurrence.  Typically in patients with ER positive cancers that are node negative if the RS score is high typically greater than or equal to 26, chemotherapy is recommended.  ?In women with intermediate recurrence score younger than 36, there can still be some role for chemotherapy in addition to endocrine therapy especially if the recurrence score is between 21-25. ?If chemotherapy is needed, this will precede radiation and then after radiation she will continue on antiestrogen therapy. ? ?If Oncotype suggests low risk for recurrence and no benefit from chemotherapy, she will proceed with adjuvant radiation followed by antiestrogen therapy.  She has taken anastrozole for 5 years in the past for DCIS and tolerated it well.  We have once again discussed mechanism of action of anastrozole, adverse effects including but not limited to postmenopausal symptoms, arthralgias/myalgias, bone loss, possible increased risk of cardiovascular events.  I do not believe she will be a good candidate for tamoxifen since she had history of blood clot while she was on birth control.  We could try letrozole versus exemestane as well. ? ?She will return to clinic after surgery to discuss final pathology and Oncotype results. ? ?

## 2021-11-27 ENCOUNTER — Encounter: Payer: Self-pay | Admitting: Physical Therapy

## 2021-11-27 ENCOUNTER — Other Ambulatory Visit: Payer: Self-pay | Admitting: Surgery

## 2021-11-27 ENCOUNTER — Ambulatory Visit: Payer: Medicare Other | Admitting: Physical Therapy

## 2021-11-27 DIAGNOSIS — Z853 Personal history of malignant neoplasm of breast: Secondary | ICD-10-CM

## 2021-11-27 DIAGNOSIS — M25611 Stiffness of right shoulder, not elsewhere classified: Secondary | ICD-10-CM

## 2021-11-27 DIAGNOSIS — G8929 Other chronic pain: Secondary | ICD-10-CM

## 2021-11-27 DIAGNOSIS — R293 Abnormal posture: Secondary | ICD-10-CM

## 2021-11-27 DIAGNOSIS — C50411 Malignant neoplasm of upper-outer quadrant of right female breast: Secondary | ICD-10-CM | POA: Diagnosis not present

## 2021-11-27 NOTE — Progress Notes (Signed)
71 y.o. G2P2002 Married White or Caucasian Not Hispanic or Latino female here for annual exam.  H/O hysterectomy.  No vaginal bleeding.  ?She is sexually active, very uncomfortable.  ? ?H/O DCIS of the left breast in 2005, treated with lumpectomy, radiation and a 5 year course of an antiestrogen. ? ?Earlier this year she was diagnosed with right breast cancer. She has a right breast lumpectomy scheduled for next week.  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  ? ?She is on vaginal estrogen, her breast surgeon told her she could stay on it. Her primary care fills her estrogen for her.  ?  ?No bowel or bladder c/o.  ? ?No LMP recorded. Patient has had a hysterectomy.          ?Sexually active: Yes.    ?The current method of family planning is status post hysterectomy.    ?Exercising: Yes.     Walking; 3x a week/30 mins ?Smoker:  no ? ?Health Maintenance: ?Pap:  10/04/19 WNL, 12/17/10  WNL  ?History of abnormal Pap:  no ?MMG:  11/18/21 grade II invasive carcinoma ductal  ?BMD:   10/27/19 osteopenia  ?Colonoscopy: FOB w/ PCP yearly ?TDaP: PCP  ?Gardasil: n/a ? ? reports that she quit smoking about 31 years ago. Her smoking use included cigarettes. She has a 10.00 pack-year smoking history. She has never used smokeless tobacco. She reports current alcohol use. She reports that she does not use drugs. ? ?Past Medical History:  ?Diagnosis Date  ? Cancer ([facility]) 07/2004  ? breast cancer  ? Chest pressure   ? Clotting disorder ([facility]) 1970's  ? from birthcontrol   ? Esophageal spasm   ? Fatigue   ? GERD (gastroesophageal reflux disease)   ? History of dizziness   ? Hyperlipidemia   ? Hypertension   ? Laryngitis   ? Low bone mass   ? Personal history of radiation therapy 07/2004  ? left breast  ? Pre-diabetes   ? ? ?Past Surgical History:  ?Procedure Laterality Date  ? ANKLE FRACTURE SURGERY    ? BREAST LUMPECTOMY  07/17/2004  ? left breast  ? cataract surg    ? KNEE SURGERY     ? Arthroscopic  ? SHOULDER SURGERY    ? VAGINAL HYSTERECTOMY    ? ? ?Current Outpatient Medications  ?Medication Sig Dispense Refill  ? busPIRone (BUSPAR) 5 MG tablet Take 5 mg by mouth 2 (two) times daily.    ? cetirizine (ZYRTEC) 10 MG tablet Take 10 mg by mouth daily.    ? estradiol (ESTRACE VAGINAL) 0.1 MG/GM vaginal cream INSERT ONE-HALF GRAM INTRAVAGINALLY THREE TIMES A WEEK AT BEDTIME (Patient taking differently: Place 1 Applicatorful vaginally. Using 1 or 2 times a week) 43 g 7  ? hydrochlorothiazide (HYDRODIURIL) 25 MG tablet Take 25 mg by mouth daily.    ? lisinopril (ZESTRIL) 20 MG tablet Take 20 mg by mouth daily.    ? naproxen (NAPROSYN) 500 MG tablet Take 500 mg by mouth 2 (two) times daily with a meal.    ? traZODone (DESYREL) 150 MG tablet as directed.     ? ?No current facility-administered medications for this visit.  ? ? ?Family History  ?Problem Relation Age of Onset  ? Atrial fibrillation Father   ? Emphysema Father   ? Heart attack Father   ?     multiple  ? Heart disease Father   ? Hypertension Sister   ?   Stroke Sister   ?     x6 strokes is 55 y/o  ? Asthma Sister   ? Diabetes Sister   ? Depression Brother   ? Heart disease Brother   ? Coronary artery disease Brother   ?     x3 CABG  ? Atrial fibrillation Maternal Grandmother   ? Diabetes Maternal Grandmother   ? Heart disease Maternal Grandmother   ? Hypertension Maternal Grandmother   ? Atrial fibrillation Maternal Grandfather   ? Heart disease Maternal Grandfather   ? Hypertension Maternal Grandfather   ? Breast cancer Paternal Grandmother   ?     dx. 50s  ? Cancer Paternal Grandmother   ?     uterine or ovarian, unsure if breast cancer metastasis or second primary  ? Heart disease Paternal Grandfather   ? Hypertension Paternal Grandfather   ? Atrial fibrillation Paternal Grandfather   ? Breast cancer Paternal Aunt   ?     dx. 50s  ? Colon cancer Paternal Aunt   ? Colon cancer Paternal Uncle   ?     dx. 50s  ? Lung cancer Paternal Uncle    ? ? ?Review of Systems  ?All other systems reviewed and are negative. ? ?Exam:   ?BP 104/68   Pulse 93   Ht 5' 4.25" (1.632 m)   Wt 192 lb (87.1 kg)   SpO2 98%   BMI 32.70 kg/m?   Weight change: @WEIGHTCHANGE@ Height:   Height: 5' 4.25" (163.2 cm)  ?Ht Readings from Last 3 Encounters:  ?12/08/21 5' 4.25" (1.632 m)  ?11/26/21 5' 4" (1.626 m)  ?06/04/21 5' 4" (1.626 m)  ? ? ?General appearance: alert, cooperative and appears stated age ?Head: Normocephalic, without obvious abnormality, atraumatic ?Neck: no adenopathy, supple, symmetrical, trachea midline and thyroid normal to inspection and palpation ?Breasts: normal appearance, no masses or tenderness ?Abdomen: soft, non-tender; non distended,  no masses,  no organomegaly ?Extremities: extremities normal, atraumatic, no cyanosis or edema ?Skin: Skin color, texture, turgor normal. No rashes or lesions ?Lymph nodes: Cervical, supraclavicular, and axillary nodes normal. ?No abnormal inguinal nodes palpated ?Neurologic: Grossly normal ? ? ?Pelvic: External genitalia:  no lesions ?             Urethra:  normal appearing urethra with no masses, tenderness or lesions ?             Bartholins and Skenes: normal    ?             Vagina: normal appearing vagina with normal color and discharge, no lesions ?             Cervix: absent ?              ?Bimanual Exam:  Uterus:  uterus absent ?             Adnexa: no mass, fullness, tenderness ?              Rectovaginal: Confirms ?              Anus:  normal sphincter tone, no lesions ? ?Kim Alexis, CMA chaperoned for the exam. ? ?    ?1. GYN exam for high-risk Medicare patient ?Normal exam ?Colon cancer screening and labs with primary ? ?2. Malignant neoplasm of upper-outer quadrant of right breast in female, estrogen receptor positive ([facility]) ?Lumpectomy is scheduled next week ? ?3. Vaginal atrophy ?On vaginal estrogen ? ?4. Dyspareunia, female ?Some of the dyspareunia is   external, discussed ways to help ?-Try uberlube for  vaginal lubrication ? ?5. History of osteopenia ?Discussed calcium and vit d ?- DG Bone Density; Future ? ?6. Hypoestrogenism ?- DG Bone Density; Future ? ?In addition to the breast and pelvic exam over 25 minutes spent in counseling in regards to her dyspareunia, vaginal atrophy and osteopenia.  ? ?

## 2021-11-28 ENCOUNTER — Encounter: Payer: Self-pay | Admitting: *Deleted

## 2021-11-28 NOTE — Progress Notes (Signed)
[employer] 2021-05 - Specimen Collection Study to Evaluate Biomarkers in Subjects with Cancer   ? ?The pt left a voice message for the research nurse stating that she has decided to NOT participate in the [employer] study.  The pt said that she would not come for her research appts as planned.  No reason was given why the pt declined participation in this biomarker specimen study. Will ensure that the pt's 12/01/21 appts are canceled.   ?Brion Aliment RN, BSN, CCRP ?Clinical Research Nurse Lead ?11/28/2021 9:25 AM   ?

## 2021-11-29 HISTORY — PX: BREAST LUMPECTOMY: SHX2

## 2021-12-01 ENCOUNTER — Ambulatory Visit: Payer: Medicare Other | Attending: Surgery

## 2021-12-01 DIAGNOSIS — R293 Abnormal posture: Secondary | ICD-10-CM | POA: Insufficient documentation

## 2021-12-01 DIAGNOSIS — M25511 Pain in right shoulder: Secondary | ICD-10-CM | POA: Insufficient documentation

## 2021-12-01 DIAGNOSIS — C50411 Malignant neoplasm of upper-outer quadrant of right female breast: Secondary | ICD-10-CM | POA: Diagnosis present

## 2021-12-01 DIAGNOSIS — Z170 Estrogen receptor positive status [ER+]: Secondary | ICD-10-CM | POA: Diagnosis present

## 2021-12-01 DIAGNOSIS — M25611 Stiffness of right shoulder, not elsewhere classified: Secondary | ICD-10-CM | POA: Insufficient documentation

## 2021-12-01 DIAGNOSIS — G8929 Other chronic pain: Secondary | ICD-10-CM | POA: Insufficient documentation

## 2021-12-01 NOTE — Therapy (Signed)
?OUTPATIENT PHYSICAL THERAPY TREATMENT NOTE ? ? ?Patient Name: Claire Bernard ?MRN: [id number] ?DOB:[birth year], 71 y.o., female ?Today's Date: 12/01/2021 ? ?PCP: Leonard Downing, MD ?REFERRING PROVIDER: Leonard Downing, * ? ? PT End of Session - 12/01/21 1526   ? ? Visit Number 3   ? Number of Visits 9   ? Date for PT Re-Evaluation 01/21/22   ? PT Start Time 8921   ? PT Stop Time 1941   ? PT Time Calculation (min) 40 min   ? Activity Tolerance Patient tolerated treatment well   ? Behavior During Therapy [facility] for tasks assessed/performed   ? ?  ?  ? ?  ? ? ? ?Past Medical History:  ?Diagnosis Date  ? Cancer [facility]) 07/2004  ? breast cancer  ? Chest pressure   ? Clotting disorder ([place]) 1970's  ? from birthcontrol   ? Esophageal spasm   ? Fatigue   ? GERD (gastroesophageal reflux disease)   ? History of dizziness   ? Hyperlipidemia   ? Hypertension   ? Laryngitis   ? Low bone mass   ? Personal history of radiation therapy 07/2004  ? left breast  ? ?Past Surgical History:  ?Procedure Laterality Date  ? ANKLE FRACTURE SURGERY    ? BREAST LUMPECTOMY  07/17/2004  ? left breast  ? cataract surg    ? KNEE SURGERY    ? Arthroscopic  ? SHOULDER SURGERY    ? VAGINAL HYSTERECTOMY    ? ?Patient Active Problem List  ? Diagnosis Date Noted  ? Malignant neoplasm of upper-outer quadrant of right breast in female, estrogen receptor positive ([place]) 11/24/2021  ? History of ductal carcinoma in situ (DCIS) of breast 10/04/2019  ? Low bone mass   ? Hyperlipidemia   ? Hypertension   ? Fatigue   ? Chest pressure   ? History of dizziness   ? Esophageal spasm   ? GERD (gastroesophageal reflux disease)   ? Laryngitis   ? ? ?REFERRING DIAG: Right breast cancer and right shoulder pain with dysfunction and loss of ROM ? ?THERAPY DIAG:  ?Abnormal posture ? ?Stiffness of right shoulder, not elsewhere classified ? ?Chronic right shoulder pain ? ?Malignant neoplasm of upper-outer quadrant of right breast in female, estrogen receptor  positive ([place]) ? ?PERTINENT HISTORY: Patient was diagnosed on 10/30/2021 with right grade II invasive ductal carcinoma breast cancer. It measures 1.1 cm and is located in the upper outer quadrant. It is ER/PR positive and HER2 negative with a Ki67 of 40%. She had left breast cancer in 2005 which was treated with surgery, radiation, and anti-estrogen therapy. No axillary nodes removed per her report.  ? ?PRECAUTIONS: Active CA; previous left breast cancer surgery but not at risk for lymphedema on left arm. ? ?ONSET DATE: 11/18/2021  ? ?HAND DOMINANCE: right ? ?PATIENT GOALS   reduce lymphedema risk and learn post op HEP. Improve right shoulder ROM and decrease pain to better tolerate radiation.  ? ?SUBJECTIVE: I had some aching with some of the exercises.   ? ?PAIN:  ?PAIN:  ?Are you having pain? Yes ?NPRS scale: 0/10 ?Pain location: Rt shoulder ?Pain orientation: Right  ?PAIN TYPE: aching and sharp ?Pain description: intermittent  ?Aggravating factors: sleep, use of arm with weighted tasks ?Relieving factors: adjust activity or change positions ? ?EVALUATION OBJECTIVE FINDINGS 11/26/21: ? ? ?COGNITION: ?           Overall cognitive status: Within functional limits for tasks assessed              ?  ?  POSTURE:  ?Forward head and rounded shoulders posture ?  ?UPPER EXTREMITY AROM/PROM: ?  ?A/PROM RIGHT  11/26/2021 ?  RIGHT 12/01/21  ?Shoulder extension 42   ?Shoulder flexion 132 PAINFUL 145- stiff at end range   ?Shoulder abduction 141 PAINFUL 140- painful   ?Shoulder internal rotation 65 PAINFUL   ?Shoulder external rotation 85 PAINFUL   ?                        (Blank rows = not tested) ?  ?A/PROM LEFT  11/26/2021  ?Shoulder extension 50  ?Shoulder flexion 150  ?Shoulder abduction 155  ?Shoulder internal rotation 83  ?Shoulder external rotation 87  ?                        (Blank rows = not tested) ?  ?  ?CERVICAL AROM: ?All within normal limits ?  ?UPPER EXTREMITY STRENGTH: Left UE 5/5; unable to test right shoulder  strength due to pain ?  ?PALPATION: ?Tender to palpation right anterior shoulder, upper traps, and upper lateral arm at deltoid insertion. ?  ?SPECIAL TESTS: ?No shoulder special tests performed ?  ?  ?LYMPHEDEMA ASSESSMENTS:  ?  ?[place] RIGHT  11/26/2021  ?10 cm proximal to olecranon process 29.8  ?Olecranon process 26.6  ?10 cm proximal to ulnar styloid process 22.4  ?Just proximal to ulnar styloid process 15.9  ?Across hand at thumb web space 18.4  ?At base of 2nd digit 6.3  ?(Blank rows = not tested) ?  ?[place] LEFT  11/26/2021  ?10 cm proximal to olecranon process 30.1  ?Olecranon process 26.2  ?10 cm proximal to ulnar styloid process 21.8  ?Just proximal to ulnar styloid process 16.2  ?Across hand at thumb web space 18.6  ?At base of 2nd digit 6.4  ?(Blank rows = not tested) ?  ?  ?L-DEX LYMPHEDEMA SCREENING: ?  ?The patient was assessed using the L-Dex machine today to produce a lymphedema index baseline score. The patient will be reassessed on a regular basis (typically every 3 months) to obtain new L-Dex scores. If the score is > 6.5 points away from his/her baseline score indicating onset of subclinical lymphedema, it will be recommended to wear a compression garment for 4 weeks, 12 hours per day and then be reassessed. If the score continues to be > 6.5 points from baseline at reassessment, we will initiate lymphedema treatment. Assessing in this manner has a 95% rate of preventing clinically significant lymphedema. ?  ?  L-DEX FLOWSHEETS - 11/26/21 1100   ?  ?    ?     ?  L-DEX LYMPHEDEMA SCREENING  ?  Measurement Type Unilateral   ?  L-DEX MEASUREMENT EXTREMITY Upper Extremity   ?  POSITION  Standing   ?  DOMINANT SIDE Right   ?  At Risk Side Right   ?  BASELINE SCORE (UNILATERAL) -5.4   ?  ?   ?  ?  ?   ?  ?  ?  ?QUICK DASH SURVEY: ?  [name] - 11/26/21 0001   ?  ?  Open a tight or new jar Moderate difficulty   ?  Do heavy household chores (wash walls, wash floors) Mild difficulty   ?  Carry a  shopping bag or briefcase Mild difficulty   ?  Wash your back No difficulty   ?  Use a knife to cut food No difficulty   ?  Recreational activities in which you take some force or impact through your arm, shoulder, or hand (golf, hammering, tennis) Mild difficulty   ?  During the past week, to what extent has your arm, shoulder or hand problem interfered with your normal social activities with family, friends, neighbors, or groups? Slightly   ?  During the past week, to what extent has your arm, shoulder or hand problem limited your work or other regular daily activities Slightly   ?  Arm, shoulder, or hand pain. Mild   ?  Tingling (pins and needles) in your arm, shoulder, or hand Mild   ?  Difficulty Sleeping Mild difficulty   ?  DASH Score 22.73 %   ?  ?   ?  ?  ?   ?  ?  ?TODAY'S TREATMENT: ?12/01/21:  ?Overhead pulleys: flexion x 3 minutes, abduction x 2 minutes  ?Pec stretch in doorway: 20 second hold x 5 reps- added to HEP ?Standing Rt wall slides flexion x 10 reps, PT TC for scapular mechanics which allowed painfree ROM ?Standing red tband bil row x 20 ?Standing red tband bil extension x 20 ?Standing bil red tband ER x 20 ?Manual: P/ROM supine flexion ?11/27/21: ?Supine moist heat Rt shoulder x 5' while discussing plan for session ?P/ROM supine flexion ?AA/ROM supine shoulder flexion with dowel x 10 (Rt shoulder reaches 150 degrees) ?Seated scapular squeeze 5x3" holds ?Seated hands behind head external rotation 5x3" holds ?Standing Rt wall slides flexion x 10 reps, PT TC for scapular mechanics which allowed painfree ROM ?Standing red tband bil row x 20 ?Standing red tband bil extension x 20 ?Standing bil red tband ER x 20 ?  ?  ?PATIENT EDUCATION:  ?Education details: Lymphedema risk reduction and post op shoulder/posture HEP, Access Code: [email or web address] ?Person educated: Patient ?Education method: Explanation, Demonstration, Handout ?Education comprehension: Patient verbalized understanding and returned  demonstration ?  ?  ?HOME EXERCISE PROGRAM: ?11/27/21 ?Access Code: [email or web address] ?URL: [email or web address] ?Date: 12/01/2021 ?Prepared by: Claiborne Billings ? ?Exercises ?- Standing Shoulder Abduction Slides at Wall  - 1 x daily -

## 2021-12-03 ENCOUNTER — Encounter (HOSPITAL_BASED_OUTPATIENT_CLINIC_OR_DEPARTMENT_OTHER): Payer: Self-pay | Admitting: Surgery

## 2021-12-03 ENCOUNTER — Encounter: Payer: Self-pay | Admitting: Physical Therapy

## 2021-12-03 ENCOUNTER — Ambulatory Visit: Payer: Medicare Other | Admitting: Physical Therapy

## 2021-12-03 ENCOUNTER — Other Ambulatory Visit: Payer: Self-pay

## 2021-12-03 DIAGNOSIS — G8929 Other chronic pain: Secondary | ICD-10-CM

## 2021-12-03 DIAGNOSIS — M25611 Stiffness of right shoulder, not elsewhere classified: Secondary | ICD-10-CM

## 2021-12-03 DIAGNOSIS — R293 Abnormal posture: Secondary | ICD-10-CM

## 2021-12-03 NOTE — Therapy (Signed)
?OUTPATIENT PHYSICAL THERAPY TREATMENT NOTE ? ? ?Patient Name: Claire Bernard ?MRN: [id number] ?DOB:02-14-1951, 71 y.o., female ?Today's Date: 12/03/2021 ? ?PCP: Claire Downing, MD ?REFERRING PROVIDER: Leonard Bernard, * ? ? PT End of Session - 12/03/21 1237   ? ? Visit Number 4   ? Number of Visits 9   ? Date for PT Re-Evaluation 01/21/22   ? PT Start Time 1233   ? PT Stop Time 1320   ? PT Time Calculation (min) 47 min   ? Activity Tolerance Patient tolerated treatment well   ? Behavior During Therapy [facility] for tasks assessed/performed   ? ?  ?  ? ?  ? ? ? ?Past Medical History:  ?Diagnosis Date  ? Cancer [facility]) 07/2004  ? breast cancer  ? Chest pressure   ? Clotting disorder ([place]) 1970's  ? from birthcontrol   ? Esophageal spasm   ? Fatigue   ? GERD (gastroesophageal reflux disease)   ? History of dizziness   ? Hyperlipidemia   ? Hypertension   ? Laryngitis   ? Low bone mass   ? Personal history of radiation therapy 07/2004  ? left breast  ? Pre-diabetes   ? ?Past Surgical History:  ?Procedure Laterality Date  ? ANKLE FRACTURE SURGERY    ? BREAST LUMPECTOMY  07/17/2004  ? left breast  ? cataract surg    ? KNEE SURGERY    ? Arthroscopic  ? SHOULDER SURGERY    ? VAGINAL HYSTERECTOMY    ? ?Patient Active Problem List  ? Diagnosis Date Noted  ? Malignant neoplasm of upper-outer quadrant of right breast in female, estrogen receptor positive ([place]) 11/24/2021  ? History of ductal carcinoma in situ (DCIS) of breast 10/04/2019  ? Low bone mass   ? Hyperlipidemia   ? Hypertension   ? Fatigue   ? Chest pressure   ? History of dizziness   ? Esophageal spasm   ? GERD (gastroesophageal reflux disease)   ? Laryngitis   ? ? ?REFERRING DIAG: Right breast cancer and right shoulder pain with dysfunction and loss of ROM ? ?THERAPY DIAG:  ?Abnormal posture ? ?Stiffness of right shoulder, not elsewhere classified ? ?Chronic right shoulder pain ? ?PERTINENT HISTORY: Pt scheduled for lumpectomy 12/15/21.  Patient was  diagnosed on 10/30/2021 with right grade II invasive ductal carcinoma breast cancer. It measures 1.1 cm and is located in the upper outer quadrant. It is ER/PR positive and HER2 negative with a Ki67 of 40%. She had left breast cancer in 2005 which was treated with surgery, radiation, and anti-estrogen therapy. No axillary nodes removed per her report.  ? ?PRECAUTIONS: Active CA; previous left breast cancer surgery but not at risk for lymphedema on left arm. ? ?ONSET DATE: 11/18/2021  ? ?HAND DOMINANCE: right ? ?PATIENT GOALS   reduce lymphedema risk and learn post op HEP. Improve right shoulder ROM and decrease pain to better tolerate radiation.  ? ?SUBJECTIVE: I have more pain in the morning when I wake up b/c I can't control rolling onto my Rt shoulder.   ? ?PAIN:  ?PAIN:  ?Are you having pain? Yes ?NPRS scale: 4-5/10 ?Pain location: Rt shoulder ?Pain orientation: Right  ?PAIN TYPE: aching and sharp ?Pain description: intermittent  ?Aggravating factors: sleep, use of arm with weighted tasks ?Relieving factors: adjust activity or change positions ? ?EVALUATION OBJECTIVE FINDINGS 11/26/21: ? ? ?COGNITION: ?           Overall cognitive status: Within functional limits  for tasks assessed              ?  ?POSTURE:  ?Forward head and rounded shoulders posture ?  ?UPPER EXTREMITY AROM/PROM: ?  ?A/PROM RIGHT  11/26/2021 ?  RIGHT 12/01/21  ?Shoulder extension 42   ?Shoulder flexion 132 PAINFUL 145- stiff at end range   ?Shoulder abduction 141 PAINFUL 140- painful   ?Shoulder internal rotation 65 PAINFUL   ?Shoulder external rotation 85 PAINFUL   ?                        (Blank rows = not tested) ?  ?A/PROM LEFT  11/26/2021  ?Shoulder extension 50  ?Shoulder flexion 150  ?Shoulder abduction 155  ?Shoulder internal rotation 83  ?Shoulder external rotation 87  ?                        (Blank rows = not tested) ?  ?  ?CERVICAL AROM: ?All within normal limits ?  ?UPPER EXTREMITY STRENGTH: Left UE 5/5; unable to test right shoulder  strength due to pain ?  ?PALPATION: ?Tender to palpation right anterior shoulder, upper traps, and upper lateral arm at deltoid insertion. ?  ?SPECIAL TESTS: ?No shoulder special tests performed ?  ?  ?LYMPHEDEMA ASSESSMENTS:  ?  ?[place] RIGHT  11/26/2021  ?10 cm proximal to olecranon process 29.8  ?Olecranon process 26.6  ?10 cm proximal to ulnar styloid process 22.4  ?Just proximal to ulnar styloid process 15.9  ?Across hand at thumb web space 18.4  ?At base of 2nd digit 6.3  ?(Blank rows = not tested) ?  ?[place] LEFT  11/26/2021  ?10 cm proximal to olecranon process 30.1  ?Olecranon process 26.2  ?10 cm proximal to ulnar styloid process 21.8  ?Just proximal to ulnar styloid process 16.2  ?Across hand at thumb web space 18.6  ?At base of 2nd digit 6.4  ?(Blank rows = not tested) ?  ?  ?L-DEX LYMPHEDEMA SCREENING: ?  ?The patient was assessed using the L-Dex machine today to produce a lymphedema index baseline score. The patient will be reassessed on a regular basis (typically every 3 months) to obtain new L-Dex scores. If the score is > 6.5 points away from his/her baseline score indicating onset of subclinical lymphedema, it will be recommended to wear a compression garment for 4 weeks, 12 hours per day and then be reassessed. If the score continues to be > 6.5 points from baseline at reassessment, we will initiate lymphedema treatment. Assessing in this manner has a 95% rate of preventing clinically significant lymphedema. ?  ?  L-DEX FLOWSHEETS - 11/26/21 1100   ?  ?    ?     ?  L-DEX LYMPHEDEMA SCREENING  ?  Measurement Type Unilateral   ?  L-DEX MEASUREMENT EXTREMITY Upper Extremity   ?  POSITION  Standing   ?  DOMINANT SIDE Right   ?  At Risk Side Right   ?  BASELINE SCORE (UNILATERAL) -5.4   ?  ?   ?  ?  ?   ?  ?  ?  ?QUICK DASH SURVEY: ?  Claire Bernard - 11/26/21 0001   ?  ?  Open a tight or new jar Moderate difficulty   ?  Do heavy household chores (wash walls, wash floors) Mild difficulty   ?  Carry a  shopping bag or briefcase Mild difficulty   ?  Wash  your back No difficulty   ?  Use a knife to cut food No difficulty   ?  Recreational activities in which you take some force or impact through your arm, shoulder, or hand (golf, hammering, tennis) Mild difficulty   ?  During the past week, to what extent has your arm, shoulder or hand problem interfered with your normal social activities with family, friends, neighbors, or groups? Slightly   ?  During the past week, to what extent has your arm, shoulder or hand problem limited your work or other regular daily activities Slightly   ?  Arm, shoulder, or hand pain. Mild   ?  Tingling (pins and needles) in your arm, shoulder, or hand Mild   ?  Difficulty Sleeping Mild difficulty   ?  DASH Score 22.73 %   ?  ?   ?  ?  ?   ?  ?  ?TODAY'S TREATMENT: ?12/03/21: ?Overhead pulleys: flexion x 3', abduction x 2' ?UBE L2 2x2 fwd/bwd PT present to monitor tolerance ?Pec stretch in doorway 5x20" holds ?20 reps each: standing red tband bil row, ext and ER ?Rt UE wall slides x 10, active > active assisted VC  ?Manual therapy: P/ROM Rt shoulder flexion, abduction with ER to work on painfree positioning for upcoming radiation, supine ?Ice Rt shoulder x 5' end of session ? ?12/01/21:  ?Overhead pulleys: flexion x 3 minutes, abduction x 2 minutes  ?Pec stretch in doorway: 20 second hold x 5 reps- added to HEP ?Standing Rt wall slides flexion x 10 reps, PT TC for scapular mechanics which allowed painfree ROM ?Standing red tband bil row x 20 ?Standing red tband bil extension x 20 ?Standing bil red tband ER x 20 ?Manual: P/ROM supine flexion ?11/27/21: ?Supine moist heat Rt shoulder x 5' while discussing plan for session ?P/ROM supine flexion ?AA/ROM supine shoulder flexion with dowel x 10 (Rt shoulder reaches 150 degrees) ?Seated scapular squeeze 5x3" holds ?Seated hands behind head external rotation 5x3" holds ?Standing Rt wall slides flexion x 10 reps, PT TC for scapular mechanics which  allowed painfree ROM ?Standing red tband bil row x 20 ?Standing red tband bil extension x 20 ?Standing bil red tband ER x 20 ?  ?  ?PATIENT EDUCATION:  ?Education details: Lymphedema risk reduction and post op shoulder

## 2021-12-04 ENCOUNTER — Telehealth: Payer: Self-pay | Admitting: *Deleted

## 2021-12-04 ENCOUNTER — Encounter: Payer: Self-pay | Admitting: Genetic Counselor

## 2021-12-04 ENCOUNTER — Encounter: Payer: Self-pay | Admitting: *Deleted

## 2021-12-04 DIAGNOSIS — Z1379 Encounter for other screening for genetic and chromosomal anomalies: Secondary | ICD-10-CM | POA: Insufficient documentation

## 2021-12-04 NOTE — Telephone Encounter (Signed)
Spoke with patient to follow up from [facility] 3/29 and assess navigation needs. Patient denies any questions or concerns at this time. Encouraged her to call should anything arise. Patient verbalized understanding.  ?

## 2021-12-05 ENCOUNTER — Telehealth: Payer: Self-pay | Admitting: Genetic Counselor

## 2021-12-05 NOTE — Telephone Encounter (Signed)
I contacted Claire Bernard to discuss her genetic testing results. No pathogenic variants were identified in the first 8 genes analyzed. 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 Detailed clinic note to follow. ? ?The test report has been scanned into EPIC and is located under the Molecular Pathology section of the Results Review tab.  A portion of the result report is included below for reference.  ? ?[name], MS, [place] ?Genetic Counselor ?[email or web address] ?(P) [phone] ? ? ?

## 2021-12-08 ENCOUNTER — Encounter: Payer: Self-pay | Admitting: Obstetrics and Gynecology

## 2021-12-08 ENCOUNTER — Ambulatory Visit (INDEPENDENT_AMBULATORY_CARE_PROVIDER_SITE_OTHER): Payer: Medicare Other | Admitting: Obstetrics and Gynecology

## 2021-12-08 VITALS — BP 104/68 | HR 93 | Ht 64.25 in | Wt 192.0 lb

## 2021-12-08 DIAGNOSIS — E2839 Other primary ovarian failure: Secondary | ICD-10-CM

## 2021-12-08 DIAGNOSIS — C50411 Malignant neoplasm of upper-outer quadrant of right female breast: Secondary | ICD-10-CM | POA: Diagnosis not present

## 2021-12-08 DIAGNOSIS — Z8739 Personal history of other diseases of the musculoskeletal system and connective tissue: Secondary | ICD-10-CM

## 2021-12-08 DIAGNOSIS — N941 Unspecified dyspareunia: Secondary | ICD-10-CM | POA: Diagnosis not present

## 2021-12-08 DIAGNOSIS — Z9189 Other specified personal risk factors, not elsewhere classified: Secondary | ICD-10-CM | POA: Diagnosis not present

## 2021-12-08 DIAGNOSIS — N952 Postmenopausal atrophic vaginitis: Secondary | ICD-10-CM | POA: Diagnosis not present

## 2021-12-08 DIAGNOSIS — Z170 Estrogen receptor positive status [ER+]: Secondary | ICD-10-CM

## 2021-12-08 NOTE — Patient Instructions (Addendum)
Try uberlube for vaginal lubrication (buy it on line). ? ?Osteopenia ?Osteopenia is a loss of thickness (density) inside the bones. Another name for osteopenia is low bone mass. Mild osteopenia is a normal part of aging. It is not a disease, and it does not cause symptoms. ?However, if you have osteopenia and continue to lose bone mass, you could develop a condition that causes the bones to become thin and break more easily (osteoporosis). Osteoporosis can cause you to lose some height, have back pain, and have a stooped posture. Although osteopenia is not a disease, making changes to your lifestyle and diet can help to prevent osteopenia from developing into osteoporosis. ?What are the causes? ?Osteopenia is caused by loss of calcium in the bones. Bones are constantly changing. Old bone cells are continually being replaced with new bone cells. This process builds new bone. ?The mineral calcium is needed to build new bone and maintain bone density. Bone density is usually highest around age 26. After that, most people's bodies cannot replace all the bone they have lost with new bone. ?What increases the risk? ?You are more likely to develop this condition if: ?You are older than age 68. ?You are a woman who went through menopause early. ?You have a long illness that keeps you in bed. ?You do not get enough exercise. ?You lack certain nutrients (malnutrition). ?You have an overactive thyroid gland (hyperthyroidism). ?You use products that contain nicotine or tobacco, such as cigarettes, e-cigarettes and chewing tobacco, or you drink a lot of alcohol. ?You are taking medicines that weaken the bones, such as steroids. ?What are the signs or symptoms? ?This condition does not cause any symptoms. You may have a slightly higher risk for bone breaks (fractures), so getting fractures more easily than normal may be an indication of osteopenia. ?How is this diagnosed? ?This condition may be diagnosed based on an X-ray exam  that measures bone density (dual-energy X-ray absorptiometry, or DEXA). This test can measure bone density in your hips, spine, and wrists. ?Osteopenia has no symptoms, so this condition is usually diagnosed after a routine bone density screening test is done for osteoporosis. This routine screening is usually done for: ?Women who are age 23 or older. ?Men who are age 32 or older. ?If you have risk factors for osteopenia, you may have the screening test at an earlier age. ?How is this treated? ?Making dietary and lifestyle changes can lower your risk for osteoporosis. ?If you have severe osteopenia that is close to becoming osteoporosis, this condition can be treated with medicines and dietary supplements such as calcium and vitamin D. These supplements help to rebuild bone density. ?Follow these instructions at home: ?Eating and drinking ?Eat a diet that is high in calcium and vitamin D. ?Calcium is found in dairy products, beans, salmon, and leafy green vegetables like spinach and broccoli. ?Look for foods that have vitamin D and calcium added to them (fortified foods), such as orange juice, cereal, and bread. ? ?Lifestyle ?Do 30 minutes or more of a weight-bearing exercise every day, such as walking, jogging, or playing a sport. These types of exercises strengthen the bones. ?Do not use any products that contain nicotine or tobacco, such as cigarettes, e-cigarettes, and chewing tobacco. If you need help quitting, ask your health care provider. ?Do not drink alcohol if: ?Your health care provider tells you not to drink. ?You are pregnant, may be pregnant, or are planning to become pregnant. ?If you drink alcohol: ?Limit how much you  use to: ?0-1 drink a day for women. ?0-2 drinks a day for men. ?Be aware of how much alcohol is in your drink. In the [place] one drink equals one 12 oz bottle of beer (355 mL), one 5 oz glass of wine (148 mL), or one 1? oz glass of hard liquor (44 mL). ?General instructions ?Take  over-the-counter and prescription medicines only as told by your health care provider. These include vitamins and supplements. ?Take precautions at home to lower your risk of falling, such as: ?Keeping rooms well-lit and free of clutter, such as cords. ?Installing safety rails on stairs. ?Using rubber mats in the bathroom or other areas that are often wet or slippery. ?Keep all follow-up visits. This is important. ?Contact a health care provider if: ?You have not had a bone density screening for osteoporosis and you are: ?A woman who is age 64 or older. ?A man who is age 68 or older. ?You are a postmenopausal woman who has not had a bone density screening for osteoporosis. ?You are older than age 5 and you want to know if you should have bone density screening for osteoporosis. ?Summary ?Osteopenia is a loss of thickness (density) inside the bones. Another name for osteopenia is low bone mass. ?Osteopenia is not a disease, but it may increase your risk for a condition that causes the bones to become thin and break more easily (osteoporosis). ?You may be at risk for osteopenia if you are older than age 39 or if you are a woman who went through early menopause. ?Osteopenia does not cause any symptoms, but it can be diagnosed with a bone density screening test. ?Dietary and lifestyle changes are the first treatment for osteopenia. These may lower your risk for osteoporosis. ?This information is not intended to replace advice given to you by your health care provider. Make sure you discuss any questions you have with your health care provider. ?Document Revised: 02/01/2020 Document Reviewed: 02/01/2020 ?Elsevier Patient Education ? 2022 [place]. ? ? ? ?

## 2021-12-09 ENCOUNTER — Other Ambulatory Visit: Payer: Self-pay

## 2021-12-09 ENCOUNTER — Ambulatory Visit: Payer: Medicare Other

## 2021-12-09 ENCOUNTER — Other Ambulatory Visit: Payer: Self-pay | Admitting: *Deleted

## 2021-12-09 DIAGNOSIS — C50411 Malignant neoplasm of upper-outer quadrant of right female breast: Secondary | ICD-10-CM

## 2021-12-09 DIAGNOSIS — Z170 Estrogen receptor positive status [ER+]: Secondary | ICD-10-CM

## 2021-12-09 DIAGNOSIS — Z8739 Personal history of other diseases of the musculoskeletal system and connective tissue: Secondary | ICD-10-CM

## 2021-12-09 DIAGNOSIS — R293 Abnormal posture: Secondary | ICD-10-CM

## 2021-12-09 DIAGNOSIS — M25611 Stiffness of right shoulder, not elsewhere classified: Secondary | ICD-10-CM

## 2021-12-09 DIAGNOSIS — G8929 Other chronic pain: Secondary | ICD-10-CM

## 2021-12-09 NOTE — Therapy (Signed)
?OUTPATIENT PHYSICAL THERAPY TREATMENT NOTE ? ? ?Patient Name: Claire Bernard ?MRN: [id number] ?DOB:21-Jul-1951, 71 y.o., female ?Today's Date: 12/09/2021 ? ?PCP: Leonard Downing, MD ?REFERRING PROVIDER: Coralie Keens, MD ? ? PT End of Session - 12/09/21 1408   ? ? Visit Number 5   ? Number of Visits 9   ? Date for PT Re-Evaluation 01/21/22   ? Authorization Type UHC MC   ? PT Start Time 1400   ? PT Stop Time 1440   ? PT Time Calculation (min) 40 min   ? ?  ?  ? ?  ? ? ? ? ?Past Medical History:  ?Diagnosis Date  ? Cancer [facility]) 07/2004  ? breast cancer  ? Chest pressure   ? Clotting disorder ([place]) 1970's  ? from birthcontrol   ? Esophageal spasm   ? Fatigue   ? GERD (gastroesophageal reflux disease)   ? History of dizziness   ? Hyperlipidemia   ? Hypertension   ? Laryngitis   ? Low bone mass   ? Personal history of radiation therapy 07/2004  ? left breast  ? Pre-diabetes   ? ?Past Surgical History:  ?Procedure Laterality Date  ? ANKLE FRACTURE SURGERY    ? BREAST LUMPECTOMY  07/17/2004  ? left breast  ? cataract surg    ? KNEE SURGERY    ? Arthroscopic  ? SHOULDER SURGERY    ? VAGINAL HYSTERECTOMY    ? ?Patient Active Problem List  ? Diagnosis Date Noted  ? Vaginal atrophy 12/08/2021  ? History of osteopenia 12/08/2021  ? Genetic testing 12/04/2021  ? Malignant neoplasm of upper-outer quadrant of right breast in female, estrogen receptor positive ([place]) 11/24/2021  ? History of ductal carcinoma in situ (DCIS) of breast 10/04/2019  ? Low bone mass   ? Hyperlipidemia   ? Hypertension   ? Fatigue   ? Chest pressure   ? History of dizziness   ? Esophageal spasm   ? GERD (gastroesophageal reflux disease)   ? Laryngitis   ? ? ?REFERRING DIAG: Right breast cancer and right shoulder pain with dysfunction and loss of ROM ? ?THERAPY DIAG:  ?Abnormal posture ? ?Stiffness of right shoulder, not elsewhere classified ? ?Chronic right shoulder pain ? ?Malignant neoplasm of upper-outer quadrant of right breast in  female, estrogen receptor positive ([place]) ? ?PERTINENT HISTORY: Pt scheduled for lumpectomy 12/15/21.  Patient was diagnosed on 10/30/2021 with right grade II invasive ductal carcinoma breast cancer. It measures 1.1 cm and is located in the upper outer quadrant. It is ER/PR positive and HER2 negative with a Ki67 of 40%. She had left breast cancer in 2005 which was treated with surgery, radiation, and anti-estrogen therapy. No axillary nodes removed per her report.  ? ?PRECAUTIONS: Active CA; previous left breast cancer surgery but not at risk for lymphedema on left arm. ? ?ONSET DATE: 11/18/2021  ? ?HAND DOMINANCE: right ? ?PATIENT GOALS   reduce lymphedema risk and learn post op HEP. Improve right shoulder ROM and decrease pain to better tolerate radiation.  ? ?SUBJECTIVE: Patient states pain is tolerable. She still rolls onto right side.  A lot of appointments to get to before Monday.     ? ?PAIN:  ?PAIN:  ?Are you having pain? Yes ?NPRS scale: 4-5/10 ?Pain location: Rt shoulder ?Pain orientation: Right  ?PAIN TYPE: aching and sharp ?Pain description: intermittent  ?Aggravating factors: sleep, use of arm with weighted tasks ?Relieving factors: adjust activity or change positions ? ?EVALUATION OBJECTIVE  FINDINGS 11/26/21: ? ? ?COGNITION: ?           Overall cognitive status: Within functional limits for tasks assessed              ?  ?POSTURE:  ?Forward head and rounded shoulders posture ?  ?UPPER EXTREMITY AROM/PROM: ?  ?A/PROM RIGHT  11/26/2021 ?  RIGHT 12/01/21  ?Shoulder extension 42   ?Shoulder flexion 132 PAINFUL 145- stiff at end range   ?Shoulder abduction 141 PAINFUL 140- painful   ?Shoulder internal rotation 65 PAINFUL   ?Shoulder external rotation 85 PAINFUL   ?                        (Blank rows = not tested) ?  ?A/PROM LEFT  11/26/2021  ?Shoulder extension 50  ?Shoulder flexion 150  ?Shoulder abduction 155  ?Shoulder internal rotation 83  ?Shoulder external rotation 87  ?                        (Blank rows = not  tested) ?  ?  ?CERVICAL AROM: ?All within normal limits ?  ?UPPER EXTREMITY STRENGTH: Left UE 5/5; unable to test right shoulder strength due to pain ?  ?PALPATION: ?Tender to palpation right anterior shoulder, upper traps, and upper lateral arm at deltoid insertion. ?  ?SPECIAL TESTS: ?No shoulder special tests performed ?  ?  ?LYMPHEDEMA ASSESSMENTS:  ?  ?[place] RIGHT  11/26/2021  ?10 cm proximal to olecranon process 29.8  ?Olecranon process 26.6  ?10 cm proximal to ulnar styloid process 22.4  ?Just proximal to ulnar styloid process 15.9  ?Across hand at thumb web space 18.4  ?At base of 2nd digit 6.3  ?(Blank rows = not tested) ?  ?[place] LEFT  11/26/2021  ?10 cm proximal to olecranon process 30.1  ?Olecranon process 26.2  ?10 cm proximal to ulnar styloid process 21.8  ?Just proximal to ulnar styloid process 16.2  ?Across hand at thumb web space 18.6  ?At base of 2nd digit 6.4  ?(Blank rows = not tested) ?  ?  ?L-DEX LYMPHEDEMA SCREENING: ?  ?The patient was assessed using the L-Dex machine today to produce a lymphedema index baseline score. The patient will be reassessed on a regular basis (typically every 3 months) to obtain new L-Dex scores. If the score is > 6.5 points away from his/her baseline score indicating onset of subclinical lymphedema, it will be recommended to wear a compression garment for 4 weeks, 12 hours per day and then be reassessed. If the score continues to be > 6.5 points from baseline at reassessment, we will initiate lymphedema treatment. Assessing in this manner has a 95% rate of preventing clinically significant lymphedema. ?  ?  L-DEX FLOWSHEETS - 11/26/21 1100   ?  ?    ?     ?  L-DEX LYMPHEDEMA SCREENING  ?  Measurement Type Unilateral   ?  L-DEX MEASUREMENT EXTREMITY Upper Extremity   ?  POSITION  Standing   ?  DOMINANT SIDE Right   ?  At Risk Side Right   ?  BASELINE SCORE (UNILATERAL) -5.4   ?  ?   ?  ?  ?   ?  ?  ?  ?QUICK DASH SURVEY: ?  [name] - 11/26/21 0001   ?  ?  Open  a tight or new jar Moderate difficulty   ?  Do heavy household chores (wash  walls, wash floors) Mild difficulty   ?  Carry a shopping bag or briefcase Mild difficulty   ?  Wash your back No difficulty   ?  Use a knife to cut food No difficulty   ?  Recreational activities in which you take some force or impact through your arm, shoulder, or hand (golf, hammering, tennis) Mild difficulty   ?  During the past week, to what extent has your arm, shoulder or hand problem interfered with your normal social activities with family, friends, neighbors, or groups? Slightly   ?  During the past week, to what extent has your arm, shoulder or hand problem limited your work or other regular daily activities Slightly   ?  Arm, shoulder, or hand pain. Mild   ?  Tingling (pins and needles) in your arm, shoulder, or hand Mild   ?  Difficulty Sleeping Mild difficulty   ?  DASH Score 22.73 %   ?  ?   ?  ?  ?   ?  ?  ?TODAY'S TREATMENT: ?12/09/21: ?UBE L2 2x2 fwd/bwd PT present to monitor tolerance ?Overhead pulleys: flexion x 3', abduction x 3' ?Pec stretch in doorway 5x20" holds ?20 reps each: standing red tband bil row, ext, horizontal abd and ER ?3 way scapular stabilization x 5 each  ?Manual therapy: P/ROM Rt shoulder flexion, abduction with ER to work on painfree positioning for upcoming radiation, supine ?Patient declined ice ? ?12/03/21: ?Overhead pulleys: flexion x 3', abduction x 2' ?UBE L2 2x2 fwd/bwd PT present to monitor tolerance ?Pec stretch in doorway 5x20" holds ?20 reps each: standing red tband bil row, ext and ER ?Rt UE wall slides x 10, active > active assisted VC  ?Manual therapy: P/ROM Rt shoulder flexion, abduction with ER to work on painfree positioning for upcoming radiation, supine ?Ice Rt shoulder x 5' end of session ? ?12/01/21:  ?Overhead pulleys: flexion x 3 minutes, abduction x 2 minutes  ?Pec stretch in doorway: 20 second hold x 5 reps- added to HEP ?Standing Rt wall slides flexion x 10 reps, PT TC for  scapular mechanics which allowed painfree ROM ?Standing red tband bil row x 20 ?Standing red tband bil extension x 20 ?Standing bil red tband ER x 20 ?Manual: P/ROM supine flexion ?11/27/21: ?Supine moist heat Rt

## 2021-12-10 ENCOUNTER — Encounter: Payer: Self-pay | Admitting: *Deleted

## 2021-12-10 ENCOUNTER — Telehealth: Payer: Self-pay

## 2021-12-10 ENCOUNTER — Telehealth: Payer: Self-pay | Admitting: Hematology and Oncology

## 2021-12-10 NOTE — Telephone Encounter (Signed)
Patient called in voice mail. States Dr. Talbert Nan told her if her PCP would not or was unable to prescribe her estrogen cream to call the office and Dr. Talbert Nan would send it in for her. SHe would like Dr. Talbert Nan to prescribe. ? ?12/08/21 visit note "She is on vaginal estrogen, her breast surgeon told her she could stay on it. Her primary care fills her estrogen for her. " ?

## 2021-12-10 NOTE — Telephone Encounter (Signed)
.  Called patient to schedule appointment per 4/11 inbasket, patient is aware of date and time.   ?

## 2021-12-12 ENCOUNTER — Ambulatory Visit: Payer: Medicare Other

## 2021-12-12 ENCOUNTER — Other Ambulatory Visit: Payer: Self-pay | Admitting: Obstetrics and Gynecology

## 2021-12-12 ENCOUNTER — Ambulatory Visit
Admission: RE | Admit: 2021-12-12 | Discharge: 2021-12-12 | Disposition: A | Discharge status: Home / Self Care | Payer: Medicare Other | Source: Ambulatory Visit | Attending: Surgery | Admitting: Surgery

## 2021-12-12 ENCOUNTER — Encounter (HOSPITAL_BASED_OUTPATIENT_CLINIC_OR_DEPARTMENT_OTHER)
Admission: RE | Admit: 2021-12-12 | Discharge: 2021-12-12 | Disposition: A | Discharge status: Home / Self Care | Payer: Medicare Other | Source: Ambulatory Visit | Attending: Surgery | Admitting: Surgery

## 2021-12-12 DIAGNOSIS — Z01810 Encounter for preprocedural cardiovascular examination: Secondary | ICD-10-CM | POA: Insufficient documentation

## 2021-12-12 DIAGNOSIS — G8929 Other chronic pain: Secondary | ICD-10-CM

## 2021-12-12 DIAGNOSIS — Z853 Personal history of malignant neoplasm of breast: Secondary | ICD-10-CM

## 2021-12-12 DIAGNOSIS — Z79890 Hormone replacement therapy (postmenopausal): Secondary | ICD-10-CM

## 2021-12-12 DIAGNOSIS — Z170 Estrogen receptor positive status [ER+]: Secondary | ICD-10-CM

## 2021-12-12 DIAGNOSIS — M25611 Stiffness of right shoulder, not elsewhere classified: Secondary | ICD-10-CM

## 2021-12-12 DIAGNOSIS — R293 Abnormal posture: Secondary | ICD-10-CM

## 2021-12-12 MED ORDER — ESTRADIOL 0.1 MG/GM VA CREA: TOPICAL_CREAM | VAGINAL | 1 refills | Status: DC

## 2021-12-12 MED ORDER — ENSURE PRE-SURGERY PO LIQD
296.0000 mL | Freq: Once | ORAL | Status: DC
Start: 2021-12-13 — End: 2021-12-15

## 2021-12-12 NOTE — Telephone Encounter (Signed)
Pt notified and voiced understanding 

## 2021-12-12 NOTE — Progress Notes (Signed)

## 2021-12-12 NOTE — Therapy (Signed)
?OUTPATIENT PHYSICAL THERAPY TREATMENT NOTE ? ? ?Patient Name: Claire Bernard ?MRN: [id number] ?DOB:[birth year], 71 y.o., female ?Today's Date: 12/12/2021 ? ?PCP: Leonard Downing, MD ?REFERRING PROVIDER: Leonard Downing, * ? ? PT End of Session - 12/12/21 1103   ? ? Visit Number 6   ? Date for PT Re-Evaluation 01/21/22   ? Authorization Type UHC MC   ? PT Start Time 1104   ? PT Stop Time 4742   ? PT Time Calculation (min) 28 min   ? Activity Tolerance Patient tolerated treatment well   ? Behavior During Therapy [facility] for tasks assessed/performed   ? ?  ?  ? ?  ? ? ? ? ?Past Medical History:  ?Diagnosis Date  ? Cancer [facility]) 07/2004  ? breast cancer  ? Chest pressure   ? Clotting disorder ([place]) 1970's  ? from birthcontrol   ? Esophageal spasm   ? Fatigue   ? GERD (gastroesophageal reflux disease)   ? History of dizziness   ? Hyperlipidemia   ? Hypertension   ? Laryngitis   ? Low bone mass   ? Personal history of radiation therapy 07/2004  ? left breast  ? Pre-diabetes   ? ?Past Surgical History:  ?Procedure Laterality Date  ? ANKLE FRACTURE SURGERY    ? BREAST LUMPECTOMY  07/17/2004  ? left breast  ? cataract surg    ? KNEE SURGERY    ? Arthroscopic  ? SHOULDER SURGERY    ? VAGINAL HYSTERECTOMY    ? ?Patient Active Problem List  ? Diagnosis Date Noted  ? Vaginal atrophy 12/08/2021  ? History of osteopenia 12/08/2021  ? Genetic testing 12/04/2021  ? Malignant neoplasm of upper-outer quadrant of right breast in female, estrogen receptor positive ([place]) 11/24/2021  ? History of ductal carcinoma in situ (DCIS) of breast 10/04/2019  ? Low bone mass   ? Hyperlipidemia   ? Hypertension   ? Fatigue   ? Chest pressure   ? History of dizziness   ? Esophageal spasm   ? GERD (gastroesophageal reflux disease)   ? Laryngitis   ? ? ?REFERRING DIAG: Right breast cancer and right shoulder pain with dysfunction and loss of ROM ? ?THERAPY DIAG:  ?Abnormal posture ? ?Stiffness of right shoulder, not elsewhere  classified ? ?Chronic right shoulder pain ? ?Malignant neoplasm of upper-outer quadrant of right breast in female, estrogen receptor positive ([place]) ? ?PERTINENT HISTORY: Pt scheduled for lumpectomy 12/15/21.  Patient was diagnosed on 10/30/2021 with right grade II invasive ductal carcinoma breast cancer. It measures 1.1 cm and is located in the upper outer quadrant. It is ER/PR positive and HER2 negative with a Ki67 of 40%. She had left breast cancer in 2005 which was treated with surgery, radiation, and anti-estrogen therapy. No axillary nodes removed per her report.  ? ?PRECAUTIONS: Active CA; previous left breast cancer surgery but not at risk for lymphedema on left arm. ? ?ONSET DATE: 11/18/2021  ? ?HAND DOMINANCE: right ? ?PATIENT GOALS   reduce lymphedema risk and learn post op HEP. Improve right shoulder ROM and decrease pain to better tolerate radiation.  ? ?SUBJECTIVE: Patient states she had a lot of soreness after last visit.  She explains that bilateral upper traps were very sore and she had a lot of pain but this subsided and she is doing ok today.  "Maybe a little tightness across the back of my neck"     ? ?PAIN:  ?PAIN:  ?Are you having pain?  Yes ?NPRS scale: 4-5/10 ?Pain location: Rt shoulder ?Pain orientation: Right  ?PAIN TYPE: aching and sharp ?Pain description: intermittent  ?Aggravating factors: sleep, use of arm with weighted tasks ?Relieving factors: adjust activity or change positions ? ?EVALUATION OBJECTIVE FINDINGS 11/26/21: ? ? ?COGNITION: ?           Overall cognitive status: Within functional limits for tasks assessed              ?  ?POSTURE:  ?Forward head and rounded shoulders posture ?  ?UPPER EXTREMITY AROM/PROM: ?  ?A/PROM RIGHT  11/26/2021 ?  RIGHT 12/01/21  ?Shoulder extension 42   ?Shoulder flexion 132 PAINFUL 145- stiff at end range   ?Shoulder abduction 141 PAINFUL 140- painful   ?Shoulder internal rotation 65 PAINFUL   ?Shoulder external rotation 85 PAINFUL   ?                         (Blank rows = not tested) ?  ?A/PROM LEFT  11/26/2021  ?Shoulder extension 50  ?Shoulder flexion 150  ?Shoulder abduction 155  ?Shoulder internal rotation 83  ?Shoulder external rotation 87  ?                        (Blank rows = not tested) ?  ?  ?CERVICAL AROM: ?All within normal limits ?  ?UPPER EXTREMITY STRENGTH: Left UE 5/5; unable to test right shoulder strength due to pain ?  ?PALPATION: ?Tender to palpation right anterior shoulder, upper traps, and upper lateral arm at deltoid insertion. ?  ?SPECIAL TESTS: ?No shoulder special tests performed ?  ?  ?LYMPHEDEMA ASSESSMENTS:  ?  ?[place] RIGHT  11/26/2021  ?10 cm proximal to olecranon process 29.8  ?Olecranon process 26.6  ?10 cm proximal to ulnar styloid process 22.4  ?Just proximal to ulnar styloid process 15.9  ?Across hand at thumb web space 18.4  ?At base of 2nd digit 6.3  ?(Blank rows = not tested) ?  ?[place] LEFT  11/26/2021  ?10 cm proximal to olecranon process 30.1  ?Olecranon process 26.2  ?10 cm proximal to ulnar styloid process 21.8  ?Just proximal to ulnar styloid process 16.2  ?Across hand at thumb web space 18.6  ?At base of 2nd digit 6.4  ?(Blank rows = not tested) ?  ?  ?L-DEX LYMPHEDEMA SCREENING: ?  ?The patient was assessed using the L-Dex machine today to produce a lymphedema index baseline score. The patient will be reassessed on a regular basis (typically every 3 months) to obtain new L-Dex scores. If the score is > 6.5 points away from his/her baseline score indicating onset of subclinical lymphedema, it will be recommended to wear a compression garment for 4 weeks, 12 hours per day and then be reassessed. If the score continues to be > 6.5 points from baseline at reassessment, we will initiate lymphedema treatment. Assessing in this manner has a 95% rate of preventing clinically significant lymphedema. ?  ?  L-DEX FLOWSHEETS - 11/26/21 1100   ?  ?    ?     ?  L-DEX LYMPHEDEMA SCREENING  ?  Measurement Type Unilateral   ?  L-DEX  MEASUREMENT EXTREMITY Upper Extremity   ?  POSITION  Standing   ?  DOMINANT SIDE Right   ?  At Risk Side Right   ?  BASELINE SCORE (UNILATERAL) -5.4   ?  ?   ?  ?  ?   ?  ?  ?  ?  QUICK DASH SURVEY: ?  [name] - 11/26/21 0001   ?  ?  Open a tight or new jar Moderate difficulty   ?  Do heavy household chores (wash walls, wash floors) Mild difficulty   ?  Carry a shopping bag or briefcase Mild difficulty   ?  Wash your back No difficulty   ?  Use a knife to cut food No difficulty   ?  Recreational activities in which you take some force or impact through your arm, shoulder, or hand (golf, hammering, tennis) Mild difficulty   ?  During the past week, to what extent has your arm, shoulder or hand problem interfered with your normal social activities with family, friends, neighbors, or groups? Slightly   ?  During the past week, to what extent has your arm, shoulder or hand problem limited your work or other regular daily activities Slightly   ?  Arm, shoulder, or hand pain. Mild   ?  Tingling (pins and needles) in your arm, shoulder, or hand Mild   ?  Difficulty Sleeping Mild difficulty   ?  DASH Score 22.73 %   ?  ?   ?  ?  ?   ?  ?  ?TODAY'S TREATMENT: ?12/12/21: ?UBE L2 2x2 fwd/bwd PT present to monitor tolerance ?Overhead pulleys: flexion x 3', abduction x 3', standing IR x 3' ?Pec stretch in doorway 5x20" holds (modified to right only hand on doorway and rotate left) ?Gentle manual soft tissue mobilization to bilateral upper traps ?Patient declined ice ? ?12/09/21: ?UBE L2 2x2 fwd/bwd PT present to monitor tolerance ?Overhead pulleys: flexion x 3', abduction x 3' ?Pec stretch in doorway 5x20" holds ?20 reps each: standing red tband bil row, ext, horizontal abd and ER ?3 way scapular stabilization x 5 each  ?Manual therapy: P/ROM Rt shoulder flexion, abduction with ER to work on painfree positioning for upcoming radiation, supine ?Patient declined ice ? ?12/03/21: ?Overhead pulleys: flexion x 3', abduction x 2' ?UBE L2  2x2 fwd/bwd PT present to monitor tolerance ?Pec stretch in doorway 5x20" holds ?20 reps each: standing red tband bil row, ext and ER ?Rt UE wall slides x 10, active > active assisted VC  ?Manual therapy: P/ROM Rt should

## 2021-12-12 NOTE — Telephone Encounter (Signed)
Please let her know that the estrogen script has been sent ?

## 2021-12-13 NOTE — H&P (Signed)
?REFERRING PHYSICIAN: Breast center ? ?PROVIDER: Beverlee Nims, MD ? ?MRN: [id number] ?DOB: [birth year] ?DATE OF ENCOUNTER: 11/26/2021 ?Subjective  ? ?Chief Complaint: Breast Cancer ? ? ?History of Present Illness: ?Claire Bernard is a 71 y.o. female who is seen  as an office consultation for evaluation of Breast Cancer ?.  ? ?This is a 71 year old female who was found on recent screening mammography to have a 1 cm mass at the 9 o'clock position of the right breast. Ultrasound of this mass measured 1 cm x 1 cm x 9 mm. Ultrasound the axilla was negative. 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 It was 100% ER/PR positive, HER2 negative, and had a Ki-67 of 40%. She has a previous history of left breast cancer and undergone lumpectomy and radiation in 2005 along with antihormonal therapy. Currently she is otherwise healthy without complaints. She denies nipple discharge. She has had multiple surgeries and has had no previous problems with anesthesia. she has no cardiopulmonary issues. ? ?Review of Systems: ?A complete review of systems was obtained from the patient. I have reviewed this information and discussed as appropriate with the patient. See HPI as well for other ROS. ? ?ROS  ? ?Medical History: ?Past Medical History:  ?Diagnosis Date  ? GERD (gastroesophageal reflux disease)  ? History of cancer  ? History of lumpectomy of right breast  ? Hyperlipidemia  ? Hypertension  ? ?Patient Active Problem List  ?Diagnosis  ? Malignant neoplasm of upper-outer quadrant of right breast in female, estrogen receptor positive (CMS-HCC)  ? Low bone mass  ? Hypertension  ? Hyperlipidemia  ? History of ductal carcinoma in situ (DCIS) of breast  ? History of dizziness  ? Chest pressure  ? ?Past Surgical History:  ?Procedure Laterality Date  ? Right breast lumpectomy Right 07/17/2004  ? ankle surgery N/A  ? cataract surgery N/A  ? HYSTERECTOMY  ? knee surgery N/A  ? shoulder surgery N/A   ? ? ?Allergies  ?Allergen Reactions  ? Lidocaine Hcl Itching  ? Procaine Hcl Itching  ? ?Current Outpatient Medications on File Prior to Visit  ?Medication Sig Dispense Refill  ? busPIRone (BUSPAR) 7.5 MG tablet Take 1.5 tablets by mouth 2 (two) times daily  ? cetirizine (ZYRTEC) 10 MG tablet Take 10 mg by mouth once daily  ? estradioL (ESTRACE) 0.01 % (0.1 mg/gram) vaginal cream APPLY 1 TO 4 GRAMS VAGINALLY ONCE DAILY FOR 1 TO 2 WEEKS, GRADUALLY TAPER TO 1 GRAM VAGINALLY 1 TO 3 TIMES A WEEK  ? hydroCHLOROthiazide (HYDRODIURIL) 25 MG tablet Take 25 mg by mouth once daily  ? hydrOXYzine (ATARAX) 25 MG tablet Take 25 mg by mouth 3 (three) times daily as needed  ? lisinopriL (ZESTRIL) 20 MG tablet Take 20 mg by mouth once daily  ? naproxen (NAPROSYN) 500 MG tablet Take 500 mg by mouth 2 (two) times daily with meals  ? traZODone (DESYREL) 150 MG tablet as directed  ? ?No current facility-administered medications on file prior to visit.  ? ?Family History  ?Problem Relation Age of Onset  ? Coronary Artery Disease (Blocked arteries around heart) Mother  ? High blood pressure (Hypertension) Mother  ? Coronary Artery Disease (Blocked arteries around heart) Father  ? High blood pressure (Hypertension) Sister  ? Stroke Sister  ? Colon cancer Brother  ? Diabetes Brother  ? High blood pressure (Hypertension) Brother  ? Coronary Artery Disease (Blocked arteries around heart) Brother  ? ? ?Social History  ? ?  Tobacco Use  ?Smoking Status Former  ? Types: Cigarettes  ? Quit date: 72  ? Years since quitting: 31.2  ?Smokeless Tobacco Never  ? ? ?Social History  ? ?Socioeconomic History  ? Marital status: Married  ?Tobacco Use  ? Smoking status: Former  ?Types: Cigarettes  ?Quit date: 13  ?Years since quitting: 31.2  ? Smokeless tobacco: Never  ?Vaping Use  ? Vaping Use: Never used  ?Substance and Sexual Activity  ? Alcohol use: Never  ? Drug use: Never  ? ?Objective:  ?There were no vitals filed for this visit.  ?There is no  height or weight on file to calculate BMI. ? ?Physical Exam  ? ?She appears well on exam. ? ?There are no palpable breast masses on either side. There is no axillary adenopathy. The nipple areolar complexes are normal. ? ?Labs, Imaging and Diagnostic Testing: ?I reviewed her mammograms, ultrasound, and pathology results ? ?Assessment and Plan:  ? ?Diagnoses and all orders for this visit: ? ?Invasive ductal carcinoma of breast, female, right (CMS-HCC) ? ? ? ?We have discussed her at length at the multidisciplinary breast cancer conference. I have discussed with the patient and her husband regarding current right breast cancer. We discussed surgical management of breast cancer. We discussed breast conservation versus mastectomy. She is interested in breast conservation. I explained this would be with a right breast radioactive seed guided lumpectomy and sentinel lymph node biopsy. I explained the surgery procedure in detail. We discussed the reasons for excising (well. We discussed the risk of surgery which includes but is not limited to bleeding, infection, injury to surrounding structures, the need for further surgery if margins are positive or if lymph nodes are positive, cardiopulmonary issues, postoperative recovery, etc. They understand and wish to proceed with surgery which will be scheduled ?

## 2021-12-15 ENCOUNTER — Ambulatory Visit (HOSPITAL_BASED_OUTPATIENT_CLINIC_OR_DEPARTMENT_OTHER)
Admission: RE | Admit: 2021-12-15 | Discharge: 2021-12-15 | Disposition: A | Discharge status: Home / Self Care | Payer: Medicare Other | Attending: Surgery | Admitting: Surgery

## 2021-12-15 ENCOUNTER — Ambulatory Visit
Admission: RE | Admit: 2021-12-15 | Discharge: 2021-12-15 | Disposition: A | Discharge status: Home / Self Care | Payer: Medicare Other | Source: Ambulatory Visit | Attending: Surgery | Admitting: Surgery

## 2021-12-15 ENCOUNTER — Encounter: Payer: Self-pay | Admitting: Genetic Counselor

## 2021-12-15 ENCOUNTER — Ambulatory Visit (HOSPITAL_BASED_OUTPATIENT_CLINIC_OR_DEPARTMENT_OTHER): Payer: Medicare Other | Admitting: Anesthesiology

## 2021-12-15 ENCOUNTER — Encounter (HOSPITAL_BASED_OUTPATIENT_CLINIC_OR_DEPARTMENT_OTHER)
Admission: RE | Disposition: A | Discharge status: Home / Self Care | Payer: Self-pay | Source: Home / Self Care | Attending: Surgery

## 2021-12-15 ENCOUNTER — Encounter (HOSPITAL_BASED_OUTPATIENT_CLINIC_OR_DEPARTMENT_OTHER): Payer: Self-pay | Admitting: Surgery

## 2021-12-15 ENCOUNTER — Other Ambulatory Visit: Payer: Self-pay

## 2021-12-15 DIAGNOSIS — Z170 Estrogen receptor positive status [ER+]: Secondary | ICD-10-CM | POA: Diagnosis not present

## 2021-12-15 DIAGNOSIS — E669 Obesity, unspecified: Secondary | ICD-10-CM | POA: Insufficient documentation

## 2021-12-15 DIAGNOSIS — Z853 Personal history of malignant neoplasm of breast: Secondary | ICD-10-CM

## 2021-12-15 DIAGNOSIS — Z87891 Personal history of nicotine dependence: Secondary | ICD-10-CM | POA: Diagnosis not present

## 2021-12-15 DIAGNOSIS — Z923 Personal history of irradiation: Secondary | ICD-10-CM | POA: Insufficient documentation

## 2021-12-15 DIAGNOSIS — C50911 Malignant neoplasm of unspecified site of right female breast: Secondary | ICD-10-CM | POA: Insufficient documentation

## 2021-12-15 DIAGNOSIS — I10 Essential (primary) hypertension: Secondary | ICD-10-CM | POA: Diagnosis not present

## 2021-12-15 HISTORY — PX: BREAST LUMPECTOMY WITH RADIOACTIVE SEED AND SENTINEL LYMPH NODE BIOPSY: SHX6550

## 2021-12-15 HISTORY — DX: Prediabetes: R73.03

## 2021-12-15 SURGERY — BREAST LUMPECTOMY WITH RADIOACTIVE SEED AND SENTINEL LYMPH NODE BIOPSY
Anesthesia: General | Site: Breast | Laterality: Right

## 2021-12-15 MED ORDER — BUPIVACAINE-EPINEPHRINE 0.5% -1:200000 IJ SOLN
INTRAMUSCULAR | Status: DC | PRN
  Administered 2021-12-15: 18 mL

## 2021-12-15 MED ORDER — FENTANYL CITRATE (PF) 100 MCG/2ML IJ SOLN
INTRAMUSCULAR | Status: AC
  Filled 2021-12-15: qty 2

## 2021-12-15 MED ORDER — TRAMADOL HCL 50 MG PO TABS: 50.0000 mg | ORAL_TABLET | Freq: Four times a day (QID) | ORAL | 0 refills | Status: DC | PRN

## 2021-12-15 MED ORDER — MIDAZOLAM HCL 2 MG/2ML IJ SOLN
2.0000 mg | Freq: Once | INTRAMUSCULAR | Status: AC
  Administered 2021-12-15: 2 mg via INTRAVENOUS

## 2021-12-15 MED ORDER — FENTANYL CITRATE (PF) 100 MCG/2ML IJ SOLN
INTRAMUSCULAR | Status: AC
Start: 2021-12-15 — End: ?
  Filled 2021-12-15: qty 2

## 2021-12-15 MED ORDER — CEFAZOLIN SODIUM-DEXTROSE 2-4 GM/100ML-% IV SOLN
2.0000 g | INTRAVENOUS | Status: AC
Start: 2021-12-16 — End: 2021-12-15
  Administered 2021-12-15: 2 g via INTRAVENOUS

## 2021-12-15 MED ORDER — PROPOFOL 10 MG/ML IV BOLUS
INTRAVENOUS | Status: DC | PRN
  Administered 2021-12-15: 200 mg via INTRAVENOUS

## 2021-12-15 MED ORDER — CHLORHEXIDINE GLUCONATE CLOTH 2 % EX PADS
6.0000 | MEDICATED_PAD | Freq: Once | CUTANEOUS | Status: DC
Start: 2021-12-15 — End: 2021-12-15

## 2021-12-15 MED ORDER — FENTANYL CITRATE (PF) 100 MCG/2ML IJ SOLN
100.0000 ug | Freq: Once | INTRAMUSCULAR | Status: AC
  Administered 2021-12-15: 50 ug via INTRAVENOUS

## 2021-12-15 MED ORDER — 0.9 % SODIUM CHLORIDE (POUR BTL) OPTIME
TOPICAL | Status: DC | PRN
  Administered 2021-12-15: 250 mL

## 2021-12-15 MED ORDER — ACETAMINOPHEN 500 MG PO TABS
ORAL_TABLET | ORAL | Status: AC
  Filled 2021-12-15: qty 2

## 2021-12-15 MED ORDER — HYDROMORPHONE HCL 1 MG/ML IJ SOLN: 0.2500 mg | INTRAMUSCULAR | Status: DC | PRN

## 2021-12-15 MED ORDER — ACETAMINOPHEN 500 MG PO TABS
1000.0000 mg | ORAL_TABLET | ORAL | Status: AC
  Administered 2021-12-15: 1000 mg via ORAL

## 2021-12-15 MED ORDER — PROMETHAZINE HCL 25 MG/ML IJ SOLN: 6.2500 mg | INTRAMUSCULAR | Status: DC | PRN

## 2021-12-15 MED ORDER — EPHEDRINE SULFATE (PRESSORS) 50 MG/ML IJ SOLN
INTRAMUSCULAR | Status: DC | PRN
  Administered 2021-12-15: 10 mg via INTRAVENOUS
  Administered 2021-12-15: 5 mg via INTRAVENOUS

## 2021-12-15 MED ORDER — MAGTRACE LYMPHATIC TRACER
INTRAMUSCULAR | Status: DC | PRN
  Administered 2021-12-15: 2 mL via INTRAMUSCULAR

## 2021-12-15 MED ORDER — FENTANYL CITRATE (PF) 100 MCG/2ML IJ SOLN
INTRAMUSCULAR | Status: DC | PRN
  Administered 2021-12-15: 50 ug via INTRAVENOUS
  Administered 2021-12-15 (×2): 25 ug via INTRAVENOUS

## 2021-12-15 MED ORDER — ROPIVACAINE HCL 5 MG/ML IJ SOLN
INTRAMUSCULAR | Status: DC | PRN
  Administered 2021-12-15: 30 mL via PERINEURAL

## 2021-12-15 MED ORDER — MIDAZOLAM HCL 2 MG/2ML IJ SOLN: 2.0000 mg | Freq: Once | INTRAMUSCULAR | Status: DC

## 2021-12-15 MED ORDER — LACTATED RINGERS IV SOLN: INTRAVENOUS | Status: DC

## 2021-12-15 MED ORDER — MIDAZOLAM HCL 2 MG/2ML IJ SOLN
INTRAMUSCULAR | Status: AC
  Filled 2021-12-15: qty 2

## 2021-12-15 MED ORDER — OXYCODONE HCL 5 MG PO TABS: 5.0000 mg | ORAL_TABLET | Freq: Once | ORAL | Status: DC | PRN

## 2021-12-15 MED ORDER — DEXAMETHASONE SODIUM PHOSPHATE 4 MG/ML IJ SOLN
INTRAMUSCULAR | Status: DC | PRN
  Administered 2021-12-15: 5 mg via INTRAVENOUS

## 2021-12-15 MED ORDER — LIDOCAINE HCL (CARDIAC) PF 100 MG/5ML IV SOSY
PREFILLED_SYRINGE | INTRAVENOUS | Status: DC | PRN
  Administered 2021-12-15: 40 mg via INTRAVENOUS

## 2021-12-15 MED ORDER — OXYCODONE HCL 5 MG/5ML PO SOLN: 5.0000 mg | Freq: Once | ORAL | Status: DC | PRN

## 2021-12-15 MED ORDER — ONDANSETRON HCL 4 MG/2ML IJ SOLN
INTRAMUSCULAR | Status: DC | PRN
  Administered 2021-12-15: 4 mg via INTRAVENOUS

## 2021-12-15 MED ORDER — CEFAZOLIN SODIUM-DEXTROSE 2-4 GM/100ML-% IV SOLN
INTRAVENOUS | Status: AC
  Filled 2021-12-15: qty 100

## 2021-12-15 MED ORDER — AMISULPRIDE (ANTIEMETIC) 5 MG/2ML IV SOLN: 10.0000 mg | Freq: Once | INTRAVENOUS | Status: DC | PRN

## 2021-12-15 MED ORDER — CHLORHEXIDINE GLUCONATE CLOTH 2 % EX PADS: 6.0000 | MEDICATED_PAD | Freq: Once | CUTANEOUS | Status: DC

## 2021-12-15 MED ORDER — FENTANYL CITRATE (PF) 100 MCG/2ML IJ SOLN: 100.0000 ug | Freq: Once | INTRAMUSCULAR | Status: DC

## 2021-12-15 SURGICAL SUPPLY — 43 items
APPLIER CLIP 9.375 MED OPEN (MISCELLANEOUS) ×2
BLADE SURG 15 STRL LF DISP TIS (BLADE) ×1 IMPLANT
BLADE SURG 15 STRL SS (BLADE) ×2
CANISTER SUCT 1200ML W/VALVE (MISCELLANEOUS) IMPLANT
CHLORAPREP W/TINT 26 (MISCELLANEOUS) ×2 IMPLANT
CLIP APPLIE 9.375 MED OPEN (MISCELLANEOUS) ×1 IMPLANT
CLIP TI WIDE RED SMALL 6 (CLIP) IMPLANT
COVER BACK TABLE 60X90IN (DRAPES) ×2 IMPLANT
COVER MAYO STAND STRL (DRAPES) ×2 IMPLANT
COVER PROBE W GEL 5X96 (DRAPES) ×3 IMPLANT
DERMABOND ADVANCED (GAUZE/BANDAGES/DRESSINGS) ×1
DERMABOND ADVANCED .7 DNX12 (GAUZE/BANDAGES/DRESSINGS) ×1 IMPLANT
DRAPE LAPAROSCOPIC ABDOMINAL (DRAPES) ×2 IMPLANT
DRAPE UTILITY XL STRL (DRAPES) ×2 IMPLANT
ELECT REM PT RETURN 9FT ADLT (ELECTROSURGICAL) ×2
ELECTRODE REM PT RTRN 9FT ADLT (ELECTROSURGICAL) ×1 IMPLANT
GAUZE SPONGE 4X4 12PLY STRL LF (GAUZE/BANDAGES/DRESSINGS) IMPLANT
GLOVE SURG SIGNA 7.5 PF LTX (GLOVE) ×2 IMPLANT
GOWN STRL REUS W/ TWL LRG LVL3 (GOWN DISPOSABLE) ×1 IMPLANT
GOWN STRL REUS W/ TWL XL LVL3 (GOWN DISPOSABLE) ×1 IMPLANT
GOWN STRL REUS W/TWL LRG LVL3 (GOWN DISPOSABLE) ×6
GOWN STRL REUS W/TWL XL LVL3 (GOWN DISPOSABLE) ×2
KIT MARKER MARGIN INK (KITS) ×2 IMPLANT
NDL HYPO 25X1 1.5 SAFETY (NEEDLE) ×1 IMPLANT
NDL SAFETY ECLIPSE 18X1.5 (NEEDLE) ×1 IMPLANT
NEEDLE HYPO 18GX1.5 SHARP (NEEDLE)
NEEDLE HYPO 25X1 1.5 SAFETY (NEEDLE) ×2 IMPLANT
NS IRRIG 1000ML POUR BTL (IV SOLUTION) ×2 IMPLANT
PACK BASIN DAY SURGERY FS (CUSTOM PROCEDURE TRAY) ×2 IMPLANT
PENCIL SMOKE EVACUATOR (MISCELLANEOUS) ×2 IMPLANT
SLEEVE SCD COMPRESS KNEE MED (STOCKING) ×2 IMPLANT
SPIKE FLUID TRANSFER (MISCELLANEOUS) IMPLANT
SPONGE T-LAP 4X18 ~~LOC~~+RFID (SPONGE) ×3 IMPLANT
SUT MNCRL AB 4-0 PS2 18 (SUTURE) ×2 IMPLANT
SUT SILK 2 0 SH (SUTURE) IMPLANT
SUT VIC AB 3-0 SH 27 (SUTURE) ×2
SUT VIC AB 3-0 SH 27X BRD (SUTURE) ×1 IMPLANT
SYR CONTROL 10ML LL (SYRINGE) ×2 IMPLANT
TOWEL GREEN STERILE FF (TOWEL DISPOSABLE) ×2 IMPLANT
TRACER MAGTRACE VIAL (MISCELLANEOUS) ×1 IMPLANT
TRAY FAXITRON CT DISP (TRAY / TRAY PROCEDURE) ×2 IMPLANT
TUBE CONNECTING 20X1/4 (TUBING) ×1 IMPLANT
YANKAUER SUCT BULB TIP NO VENT (SUCTIONS) ×1 IMPLANT

## 2021-12-15 NOTE — Anesthesia Procedure Notes (Signed)
Anesthesia Regional Block: Pectoralis block  ? ?Pre-Anesthetic Checklist: , timeout performed,  Correct Patient, Correct Site, Correct Laterality,  Correct Procedure, Correct Position, site marked,  Risks and benefits discussed,  Surgical consent,  Pre-op evaluation,  At surgeon's request and post-op pain management ? ?Laterality: Right ? ?Prep: chloraprep     ?  ?Needles:  ?Injection technique: Single-shot ? ?Needle Type: Stimiplex   ? ? ?Needle Length: 9cm  ?Needle Gauge: 21  ? ? ? ?Additional Needles: ? ? ?Procedures:,,,, ultrasound used (permanent image in chart),,    ?Narrative:  ?Start time: 12/15/2021 2:57 PM ?End time: 12/15/2021 3:02 PM ?Injection made incrementally with aspirations every 5 mL. ? ?Performed by: Personally  ?Anesthesiologist: [name], MD ? ? ? ? ?

## 2021-12-15 NOTE — Discharge Instructions (Addendum)
?Post Anesthesia Home Care Instructions ? ?Activity: ?Get plenty of rest for the remainder of the day. A responsible individual must stay with you for 24 hours following the procedure.  ?For the next 24 hours, DO NOT: ?-Drive a car ?-Paediatric nurse ?-Drink alcoholic beverages ?-Take any medication unless instructed by your physician ?-Make any legal decisions or sign important papers. ? ?Meals: ?Start with liquid foods such as gelatin or soup. Progress to regular foods as tolerated. Avoid greasy, spicy, heavy foods. If nausea and/or vomiting occur, drink only clear liquids until the nausea and/or vomiting subsides. Call your physician if vomiting continues. ? ?Special Instructions/Symptoms: ?Your throat may feel dry or sore from the anesthesia or the breathing tube placed in your throat during surgery. If this causes discomfort, gargle with warm salt water. The discomfort should disappear within 24 hours. ? ?If you had a scopolamine patch placed behind your ear for the management of post- operative nausea and/or vomiting: ? ?1. The medication in the patch is effective for 72 hours, after which it should be removed.  Wrap patch in a tissue and discard in the trash. Wash hands thoroughly with soap and water. ?2. You may remove the patch earlier than 72 hours if you experience unpleasant side effects which may include dry mouth, dizziness or visual disturbances. ?3. Avoid touching the patch. Wash your hands with soap and water after contact with the patch. ?    ? ? ? ?[place] ? ? ? ?Next dose of Tylenol may be given at 8p tonight if needed ?Office Phone Number [phone] ? ?BREAST BIOPSY/ PARTIAL MASTECTOMY: POST OP INSTRUCTIONS ? ?Always review your discharge instruction sheet given to you by the facility where your surgery was performed. ? ?IF YOU HAVE DISABILITY OR FAMILY LEAVE FORMS, YOU MUST BRING THEM TO THE OFFICE FOR PROCESSING.  DO NOT GIVE THEM TO YOUR DOCTOR. ? ?A prescription for  pain medication may be given to you upon discharge.  Take your pain medication as prescribed, if needed.  If narcotic pain medicine is not needed, then you may take acetaminophen (Tylenol) or ibuprofen (Advil) as needed. ?Take your usually prescribed medications unless otherwise directed ?If you need a refill on your pain medication, please contact your pharmacy.  They will contact our office to request authorization.  Prescriptions will not be filled after 5pm or on week-ends. ?You should eat very light the first 24 hours after surgery, such as soup, crackers, pudding, etc.  Resume your normal diet the day after surgery. ?Most patients will experience some swelling and bruising in the breast.  Ice packs and a good support bra will help.  Swelling and bruising can take several days to resolve.  ?It is common to experience some constipation if taking pain medication after surgery.  Increasing fluid intake and taking a stool softener will usually help or prevent this problem from occurring.  A mild laxative (Milk of Magnesia or Miralax) should be taken according to package directions if there are no bowel movements after 48 hours. ?Unless discharge instructions indicate otherwise, you may remove your bandages 24-48 hours after surgery, and you may shower at that time.  You may have steri-strips (small skin tapes) in place directly over the incision.  These strips should be left on the skin for 7-10 days.  If your surgeon used skin glue on the incision, you may shower in 24 hours.  The glue will flake off over the next 2-3 weeks.  Any sutures or staples will  be removed at the office during your follow-up visit. ?ACTIVITIES:  You may resume regular daily activities (gradually increasing) beginning the next day.  Wearing a good support bra or sports bra minimizes pain and swelling.  You may have sexual intercourse when it is comfortable. ?You may drive when you no longer are taking prescription pain medication, you can  comfortably wear a seatbelt, and you can safely maneuver your car and apply brakes. ?RETURN TO WORK:  ______________________________________________________________________________________ ?You should see your doctor in the office for a follow-up appointment approximately two weeks after your surgery.  Your doctor?s nurse will typically make your follow-up appointment when she calls you with your pathology report.  Expect your pathology report 2-3 business days after your surgery.  You may call to check if you do not hear from [place] after three days. ?OTHER INSTRUCTIONS: OK TO SHOWER STARTING TOMORROW ?ICE PACK, TYLENOL, AND IBUPROFEN ALSO FOR PAIN ?NO VIGOROUS ACTIVITY FOR ONE WEEK ?_______________________________________________________________________________________________ _____________________________________________________________________________________________________________________________________ ?_____________________________________________________________________________________________________________________________________ ?_____________________________________________________________________________________________________________________________________ ? ?WHEN TO CALL YOUR DOCTOR: ?Fever over 101.0 ?Nausea and/or vomiting. ?Extreme swelling or bruising. ?Continued bleeding from incision. ?Increased pain, redness, or drainage from the incision. ? ?The clinic staff is available to answer your questions during regular business hours.  Please don?t hesitate to call and ask to speak to one of the nurses for clinical concerns.  If you have a medical emergency, go to the nearest emergency room or call 911.  A surgeon from [facility] is always on call at the hospital. ? ?For further questions, please visit [email or web address]   ?

## 2021-12-15 NOTE — Interval H&P Note (Signed)
History and Physical Interval Note:no change in H and P ? ?12/15/2021 ?3:14 PM ? ?[name]  has presented today for surgery, with the diagnosis of RIGHT BREAST CANCER.  The various methods of treatment have been discussed with the patient and family. After consideration of risks, benefits and other options for treatment, the patient has consented to  Procedure(s): ?RIGHT BREAST LUMPECTOMY WITH RADIOACTIVE SEED AND SENTINEL LYMPH NODE BIOPSY (Right) as a surgical intervention.  The patient's history has been reviewed, patient examined, no change in status, stable for surgery.  I have reviewed the patient's chart and labs.  Questions were answered to the patient's satisfaction.   ? ? ?Claire Bernard ? ? ?

## 2021-12-15 NOTE — Anesthesia Procedure Notes (Signed)
Procedure Name: LMA Insertion ?Date/Time: 12/15/2021 3:49 PM ?Performed by: Maryella Shivers, CRNA ?Pre-anesthesia Checklist: Patient identified, Emergency Drugs available, Suction available and Patient being monitored ?Patient Re-evaluated:Patient Re-evaluated prior to induction ?Oxygen Delivery Method: Circle system utilized ?Preoxygenation: Pre-oxygenation with 100% oxygen ?Induction Type: IV induction ?Ventilation: Mask ventilation without difficulty ?LMA: LMA inserted ?LMA Size: 4.0 ?Number of attempts: 1 ?Airway Equipment and Method: Bite block ?Placement Confirmation: positive ETCO2 ?Tube secured with: Tape ?Dental Injury: Teeth and Oropharynx as per pre-operative assessment  ? ? ? ? ?

## 2021-12-15 NOTE — Anesthesia Postprocedure Evaluation (Signed)
Anesthesia Post Note ? ?Patient: Claire Bernard ? ?Procedure(s) Performed: RIGHT BREAST LUMPECTOMY WITH RADIOACTIVE SEED AND SENTINEL LYMPH NODE BIOPSY (Right: Breast) ? ?  ? ?Patient location during evaluation: PACU ?Anesthesia Type: General ?Level of consciousness: awake and alert ?Pain management: pain level controlled ?Vital Signs Assessment: post-procedure vital signs reviewed and stable ?Respiratory status: spontaneous breathing, nonlabored ventilation and respiratory function stable ?Cardiovascular status: blood pressure returned to baseline and stable ?Postop Assessment: no apparent nausea or vomiting ?Anesthetic complications: no ? ? ?No notable events documented. ? ?Last Vitals:  ?Vitals:  ? 12/15/21 1730 12/15/21 1745  ?BP: 138/72 129/87  ?Pulse: 93 93  ?Resp: 19 20  ?Temp: 36.6 ?C   ?SpO2: 97% (!) 89%  ?  ?Last Pain:  ?Vitals:  ? 12/15/21 1730  ?TempSrc:   ?PainSc: 0-No pain  ? ? ?  ?  ?  ?  ?  ?  ? ?[name] ? ? ? ? ?

## 2021-12-15 NOTE — Anesthesia Preprocedure Evaluation (Signed)
Anesthesia Evaluation  ?Patient identified by MRN, date of birth, ID band ?Patient awake ? ? ? ?Reviewed: ?Allergy & Precautions, NPO status , Patient's Chart, lab work & pertinent test results ? ?Airway ?Mallampati: II ? ?TM Distance: >3 FB ?Neck ROM: Full ? ? ? Dental ?no notable dental hx. ? ?  ?Pulmonary ?neg pulmonary ROS, former smoker,  ?  ?Pulmonary exam normal ?breath sounds clear to auscultation ? ? ? ? ? ? Cardiovascular ?hypertension, Pt. on medications ?negative cardio ROS ?Normal cardiovascular exam ?Rhythm:Regular Rate:Normal ? ? ?  ?Neuro/Psych ?negative neurological ROS ? negative psych ROS  ? GI/Hepatic ?Neg liver ROS, GERD  ,  ?Endo/Other  ?negative endocrine ROS ? Renal/GU ?negative Renal ROS  ?negative genitourinary ?  ?Musculoskeletal ?negative musculoskeletal ROS ?(+)  ? Abdominal ?(+) + obese,   ?Peds ?negative pediatric ROS ?(+)  Hematology ?negative hematology ROS ?(+)   ?Anesthesia Other Findings ?Breast Cancer ? Reproductive/Obstetrics ?negative OB ROS ? ?  ? ? ? ? ? ? ? ? ? ? ? ? ? ?  ?  ? ? ? ? ? ? ? ? ?Anesthesia Physical ?Anesthesia Plan ? ?ASA: 3 ? ?Anesthesia Plan: General  ? ?Post-op Pain Management: Regional block* and Minimal or no pain anticipated  ? ?Induction: Intravenous ? ?PONV Risk Score and Plan: 3 and Ondansetron, Dexamethasone, Midazolam and Treatment may vary due to age or medical condition ? ?Airway Management Planned: LMA ? ?Additional Equipment:  ? ?Intra-op Plan:  ? ?Post-operative Plan: Extubation in OR ? ?Informed Consent: I have reviewed the patients History and Physical, chart, labs and discussed the procedure including the risks, benefits and alternatives for the proposed anesthesia with the patient or authorized representative who has indicated his/her understanding and acceptance.  ? ? ? ?Dental advisory given ? ?Plan Discussed with: CRNA ? ?Anesthesia Plan Comments:   ? ? ? ? ? ? ?Anesthesia Quick Evaluation ? ?

## 2021-12-15 NOTE — Progress Notes (Signed)
Assisted Dr. Miller with right, pectoralis, ultrasound guided block. Side rails up, monitors on throughout procedure. See vital signs in flow sheet. Tolerated Procedure well. 

## 2021-12-15 NOTE — Transfer of Care (Signed)
Immediate Anesthesia Transfer of Care Note ? ?Patient: Claire Bernard ? ?Procedure(s) Performed: RIGHT BREAST LUMPECTOMY WITH RADIOACTIVE SEED AND SENTINEL LYMPH NODE BIOPSY (Right: Breast) ? ?Patient Location: PACU ? ?Anesthesia Type:General ? ?Level of Consciousness: awake, alert  and oriented ? ?Airway & Oxygen Therapy: Patient Spontanous Breathing and Patient connected to face mask oxygen ? ?Post-op Assessment: Report given to RN and Post -op Vital signs reviewed and stable ? ?Post vital signs: Reviewed and stable ? ?Last Vitals:  ?Vitals Value Taken Time  ?BP    ?Temp    ?Pulse    ?Resp    ?SpO2    ? ? ?Last Pain:  ?Vitals:  ? 12/15/21 1353  ?TempSrc: Oral  ?PainSc: 3   ?   ? ?Patients Stated Pain Goal: 4 (12/15/21 1353) ? ?Complications: No notable events documented. ?

## 2021-12-15 NOTE — Op Note (Signed)
Pre Operative Diagnosis:  right breast cancer ? ?Post Operative Diagnosis: same ? ?Procedure: right breast lumpectomy with sentinel lymph node biopsy ? ?Surgeon: Dr. Coralie Keens ? ?Assistant: Cameron Sprang, MD ? ?Anesthesia: MAC ? ?EBL: 20 ? ?Complications: none ? ?Counts: reported as correct x 2 ? ?Findings:  lumpectomy specimen with seed and clip and one hot sentinel lymph node removed ? ?Indications for procedure:  This is a 71 year old female who was found on recent screening mammography to have a 1 cm mass at the 9 o'clock position of the right breast. Ultrasound of this mass measured 1 cm x 1 cm x 9 mm. Ultrasound the axilla was negative. 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 It was 100% ER/PR positive, HER2 negative, and had a Ki-67 of 40%. She has a previous history of left breast cancer and undergone lumpectomy and radiation in 2005 along with antihormonal therapy. Currently she is otherwise healthy without complaints. She denies nipple discharge. She has had multiple surgeries and has had no previous problems with anesthesia. she has no cardiopulmonary issues ? ?Details of the procedure:The patient was taken back to the operating room. The patient was placed in supine position with bilateral SCDs in place. After brief timeout mag trace was injected deep to the nipple and the patient was prepped and draped.  After appropriate anitbiotics were confirmed, a time-out was confirmed and all facts were verified.  Attention was first directed to the lumpectomy and the incision site was chosen with help of the radiotracer probe.  A linear incision in the right upper outer quadrant of the right breast was made sharply and carried with cautery dissection through the subcutaneous tissues.  Using the radiofrequency probe as a guide, cautery was used to dissect a 2 cm block of tissue surrounding the clip.  Orientation was maintained and the margins were marked and x-ray confirmed  seed and clip within the specimen.  Appropriate hemostasis was obtained and clips were placed to mark the edges of the cavity. ? ?Attention was turned to the axilla where the mag probe was used to localize the point of greatest signal.  An incision was made oblique along the inferior axillary hairline.  Dissection was carried down through the clavipectoral fascia with cautery.  A hot lymph node was localized using the mag probe and blunt dissection and the hot node was removed with cautery.  After removal of this node which was confirmed active with the mag probe there was minimal residual signal within the axilla. ? ?The breast incision was reexamined and several interrupted 3-0 Vicryl deep's were used to reapproximate the breast tissue.  Both wounds were closed with 3-0 Vicryl deep dermal sutures and running 4-0 Monocryl subcutaneous suture.  Dermabond dressing was applied.  All counts were correct.  The patient was awakened from anesthesia and taken to the PACU in good condition. ? ? ? ? ?

## 2021-12-16 ENCOUNTER — Encounter (HOSPITAL_BASED_OUTPATIENT_CLINIC_OR_DEPARTMENT_OTHER): Payer: Self-pay | Admitting: Surgery

## 2021-12-17 LAB — SURGICAL PATHOLOGY

## 2021-12-18 ENCOUNTER — Encounter (HOSPITAL_COMMUNITY): Payer: Self-pay

## 2021-12-19 ENCOUNTER — Telehealth: Payer: Self-pay | Admitting: Genetic Counselor

## 2021-12-19 NOTE — Telephone Encounter (Signed)
I contacted Claire Bernard to discuss her genetic testing results. No pathogenic variants were identified in the 77 genes analyzed. 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 Detailed clinic note to follow.  The test report has been scanned into EPIC and is located under the Molecular Pathology section of the Results Review tab.  A portion of the result report is included below for reference.   [name], MS, [facility] Genetic Counselor [email or web address] (P) [phone]

## 2021-12-22 ENCOUNTER — Telehealth: Payer: Self-pay | Admitting: *Deleted

## 2021-12-22 ENCOUNTER — Encounter: Payer: Self-pay | Admitting: *Deleted

## 2021-12-22 NOTE — Telephone Encounter (Signed)
Received order for Oncotype Testing. Requisition faxed to pathology and [place]. ? ?Pt left vm regarding possible infection at incision site. Return pt call, no answer. Informed pt to reach out to Dr. Trevor Mace office to discuss being seen. Upon further review of chart, pt did call Dr. Trevor Mace office. ?

## 2021-12-31 ENCOUNTER — Inpatient Hospital Stay: Payer: Medicare Other | Attending: Hematology and Oncology | Admitting: Hematology and Oncology

## 2021-12-31 ENCOUNTER — Other Ambulatory Visit: Payer: Self-pay

## 2021-12-31 ENCOUNTER — Encounter: Payer: Self-pay | Admitting: Hematology and Oncology

## 2021-12-31 ENCOUNTER — Telehealth: Payer: Self-pay | Admitting: Radiation Oncology

## 2021-12-31 DIAGNOSIS — Z86000 Personal history of in-situ neoplasm of breast: Secondary | ICD-10-CM | POA: Diagnosis not present

## 2021-12-31 DIAGNOSIS — Z923 Personal history of irradiation: Secondary | ICD-10-CM | POA: Insufficient documentation

## 2021-12-31 DIAGNOSIS — Z87891 Personal history of nicotine dependence: Secondary | ICD-10-CM | POA: Insufficient documentation

## 2021-12-31 DIAGNOSIS — Z170 Estrogen receptor positive status [ER+]: Secondary | ICD-10-CM | POA: Insufficient documentation

## 2021-12-31 DIAGNOSIS — Z79899 Other long term (current) drug therapy: Secondary | ICD-10-CM | POA: Insufficient documentation

## 2021-12-31 DIAGNOSIS — C50411 Malignant neoplasm of upper-outer quadrant of right female breast: Secondary | ICD-10-CM | POA: Insufficient documentation

## 2021-12-31 NOTE — Assessment & Plan Note (Signed)
This is a very pleasant 71 year old female patient with newly diagnosed right breast invasive ductal carcinoma, grade 2, 11 mm on ultrasound, prognostics ER 100% positive strong staining, PR 100% positive strong staining, Ki-67 of 40% and HER2 negative by IHC (0). ? ?Given small tumor, strong ER/PR positivity, we have discussed about upfront surgery followed by Oncotype testing.  Oncotype score resulted at 15, distant risk of recurrence at 9 years is about 4% with AI or tamoxifen and absolute chemotherapy benefit less than 1% in this group. ? ?Given lack of benefit with chemotherapy, she will proceed with adjuvant radiation which will be followed by antiestrogen therapy.  She has tolerated anastrozole very well in the past and she would want to go back on the same medication.  Sent an in basket message to Dr. Isidore Moos for scheduling adjuvant radiation.  She will return to clinic to follow-up with [place] in mid July to initiate antiestrogen therapy. ?

## 2021-12-31 NOTE — Telephone Encounter (Signed)
5/3 @ 12:58 pm Left voicemail for patient to call our office. ?

## 2021-12-31 NOTE — Progress Notes (Signed)
[place] ?CONSULT NOTE ? ?Patient Care Team: ?Leonard Downing, MD as PCP - General (Family Medicine) ?Coralie Keens, MD as Consulting Physician (General Surgery) ?Benay Pike, MD as Consulting Physician (Hematology and Oncology) ?Eppie Gibson, MD as Attending Physician (Radiation Oncology) ?Mauro Kaufmann, RN as Oncology Nurse Navigator ?Rockwell Germany, RN as Oncology Nurse Navigator ? ?CHIEF COMPLAINTS/PURPOSE OF CONSULTATION:  ?Newly diagnosed breast cancer ? ?HISTORY OF PRESENTING ILLNESS:  ?Claire Bernard 71 y.o. female is here because of recent diagnosis of right  ? ?I reviewed her records extensively and collaborated the history with the patient. ? ?SUMMARY OF ONCOLOGIC HISTORY: ?Oncology History  ?Malignant neoplasm of upper-outer quadrant of right breast in female, estrogen receptor positive ([place])  ?10/10/2021 Imaging  ? Screening mammogram showed a possible mass in the right breast.  Diagnostic mammogram confirmed an irregular isoechoic mass in the outer breast at the middle depth measuring just over 1 cm associated with architectural distortion.  No associated suspicious calcifications.  Targeted ultrasound was performed, demonstrating an irregular hypoechoic mass with vague margins at the 9 o'clock position 4 cm from nipple at middle depth measuring approximately 1.1 x 0.9 x 1 cm demonstrating posterior acoustic shadowing and no internal power Doppler flow corresponding to the screening mammographic finding.  Right axilla demonstrated no pathologic lymphadenopathy. ?Ultrasound of the area showed highly suspicious 1.1 cm mass involving the outer right breast at 9 o'clock position 4 cm from the nipple.  No pathologic right axillary lymphadenopathy. ? ?  ?11/18/2021 Pathology Results  ? Right breast needle core biopsy 9 o'clock position showed invasive carcinoma of no special type, grade 2, 11 mm in greatest length, ER 100% positive strong staining intensity, PR 100%  positive strong staining intensity, Ki-67 of 40% and HER2 negative by IHC. ?  ?11/24/2021 Initial Diagnosis  ? Malignant neoplasm of upper-outer quadrant of right breast in female, estrogen receptor positive ([place]) ?  ? Genetic Testing  ? [facility] CancerNext-Expanded Panel was Negative. 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 Report date is 12/15/2021. ? ?The CancerNext-Expanded gene panel offered by [facility] and includes sequencing, rearrangement, and RNA analysis for the following 77 genes: AIP, ALK, APC, ATM, AXIN2, BAP1, BARD1, BLM, BMPR1A, BRCA1, BRCA2, BRIP1, CDC73, CDH1, CDK4, CDKN1B, CDKN2A, CHEK2, CTNNA1, DICER1, FANCC, FH, FLCN, GALNT12, KIF1B, LZTR1, MAX, MEN1, MET, MLH1, MSH2, MSH3, MSH6, MUTYH, NBN, NF1, NF2, NTHL1, PALB2, PHOX2B, PMS2, POT1, PRKAR1A, PTCH1, PTEN, RAD51C, RAD51D, RB1, RECQL, RET, SDHA, SDHAF2, SDHB, SDHC, SDHD, SMAD4, SMARCA4, SMARCB1, SMARCE1, STK11, SUFU, TMEM127, TP53, TSC1, TSC2, VHL and XRCC2 (sequencing and deletion/duplication); EGFR, EGLN1, HOXB13, KIT, MITF, PDGFRA, POLD1, and POLE (sequencing only); EPCAM and GREM1 (deletion/duplication only).  ?  ?12/15/2021 Surgery  ? She had right breast lumpectomy with sentinel lymph node biopsy.  Pathology showed invasive ductal carcinoma, 1.7 cm, grade 3 along with high-grade DCIS, resection margins are negative for carcinoma sentinel lymph node negative.  Previous prognostic showed ER 100% positive strong staining, PR 100% positive strong staining, KI however at 40%, HER2 negative. ? ?  ?12/29/2021 Oncotype testing  ? Oncotype at 15, distant recurrence risk at 9 years of 4% and absolute chemotherapy benefit less than 1%. ?  ? 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   ? ?Claire Bernard is recovering well from her surgery.  She is  planning to go to the beach for a couple weeks.   She had a small area of infection near her surgical infection which is healing.  She denies any other complaints today.  She is thankful that she may not have to go through chemotherapy.  Rest of the pertinent 10 point ROS reviewed and negative. ? ?MEDICAL HISTORY:  ?Past Medical History:  ?Diagnosis Date  ? Cancer [facility]) 07/2004  ? breast cancer  ? Chest pressure   ? Clotting disorder ([place]) 1970's  ? from birthcontrol   ? Esophageal spasm   ? Fatigue   ? GERD (gastroesophageal reflux disease)   ? History of dizziness   ? Hyperlipidemia   ? Hypertension   ? Laryngitis   ? Low bone mass   ? Personal history of radiation therapy 07/2004  ? left breast  ? Pre-diabetes   ? ? ?SURGICAL HISTORY: ?Past Surgical History:  ?Procedure Laterality Date  ? ANKLE FRACTURE SURGERY    ? BREAST LUMPECTOMY  07/17/2004  ? left breast  ? BREAST LUMPECTOMY WITH RADIOACTIVE SEED AND SENTINEL LYMPH NODE BIOPSY Right 12/15/2021  ? Procedure: RIGHT BREAST LUMPECTOMY WITH RADIOACTIVE SEED AND SENTINEL LYMPH NODE BIOPSY;  Surgeon: Coralie Keens, MD;  Location: [place] [facility];  Service: General;  Laterality: Right;  ? cataract surg    ? KNEE SURGERY    ? Arthroscopic  ? SHOULDER SURGERY    ? VAGINAL HYSTERECTOMY    ? ? ?SOCIAL HISTORY: ?Social History  ? ?Socioeconomic History  ? Marital status: Married  ?  Spouse name: Not on file  ? Number of children: 2  ? Years of education: Not on file  ? Highest education level: Not on file  ?Occupational History  ?  Employer: [employer]  ?Tobacco Use  ? Smoking status: Former  ?  Packs/day: 0.50  ?  Years: 20.00  ?  Pack years: 10.00  ?  Types: Cigarettes  ?  Quit date: 08/31/1990  ?  Years since quitting: 31.3  ? Smokeless tobacco: Never  ?Vaping Use  ? Vaping Use: Never used  ?Substance and Sexual Activity  ? Alcohol use: Yes  ?  Alcohol/week: 0.0 standard drinks  ?  Comment: Rare  ? Drug use: No  ? Sexual activity: Yes  ?  Birth control/protection: Post-menopausal, Surgical  ?  Comment: 1st  intercourse 71 yo-Fewer than 5 partners  ?Other Topics Concern  ? Not on file  ?Social History Narrative  ? Not on file  ? ?Social Determinants of Health  ? ?Financial Resource Strain: Low Risk   ? Difficulty of Paying Living Expenses: Not hard at all  ?Food Insecurity: No Food Insecurity  ? Worried About Charity fundraiser in the Last Year: Never true  ? Ran Out of Food in the Last Year: Never true  ?Transportation Needs: No Transportation Needs  ? Lack of Transportation (Medical): No  ? Lack of Transportation (Non-Medical): No  ?Physical Activity: Not on file  ?Stress: Not on file  ?Social Connections: Not on file  ?Intimate Partner Violence: Not on file  ? ? ?FAMILY HISTORY: ?Family History  ?Problem Relation Age of Onset  ? Atrial fibrillation Father   ? Emphysema Father   ? Heart attack Father   ?     multiple  ? Heart disease Father   ? Hypertension Sister   ? Stroke Sister   ?     x6 strokes is 37 y/o  ? Asthma Sister   ?  Diabetes Sister   ? Depression Brother   ? Heart disease Brother   ? Coronary artery disease Brother   ?     x3 CABG  ? Atrial fibrillation Maternal Grandmother   ? Diabetes Maternal Grandmother   ? Heart disease Maternal Grandmother   ? Hypertension Maternal Grandmother   ? Atrial fibrillation Maternal Grandfather   ? Heart disease Maternal Grandfather   ? Hypertension Maternal Grandfather   ? Breast cancer Paternal Grandmother   ?     dx. 59s  ? Cancer Paternal Grandmother   ?     uterine or ovarian, unsure if breast cancer metastasis or second primary  ? Heart disease Paternal Grandfather   ? Hypertension Paternal Grandfather   ? Atrial fibrillation Paternal Grandfather   ? Breast cancer Paternal Aunt   ?     dx. 47s  ? Colon cancer Paternal Aunt   ? Colon cancer Paternal Uncle   ?     dx. 62s  ? Lung cancer Paternal Uncle   ? ? ?ALLERGIES:  is allergic to caine-1 [lidocaine hcl] and procaine hcl. ? ?MEDICATIONS:  ?Current Outpatient Medications  ?Medication Sig Dispense Refill  ?  busPIRone (BUSPAR) 5 MG tablet Take 5 mg by mouth 2 (two) times daily.    ? cetirizine (ZYRTEC) 10 MG tablet Take 10 mg by mouth daily.    ? estradiol (ESTRACE VAGINAL) 0.1 MG/GM vaginal cream INSERT ONE-HALF

## 2022-01-02 ENCOUNTER — Ambulatory Visit: Payer: Self-pay | Admitting: Genetic Counselor

## 2022-01-02 NOTE — Progress Notes (Signed)
HPI:   ?Claire Bernard was previously seen in the [place] clinic due to a personal and family history of cancer and concerns regarding a hereditary predisposition to cancer. Please refer to our prior cancer genetics clinic note for more information regarding our discussion, assessment and recommendations, at the time. Claire Bernard recent genetic test results were disclosed to her, as were recommendations warranted by these results. These results and recommendations are discussed in more detail below. ? ?CANCER HISTORY:  ?Oncology History  ?Malignant neoplasm of upper-outer quadrant of right breast in female, estrogen receptor positive ([place])  ?10/10/2021 Imaging  ? Screening mammogram showed a possible mass in the right breast.  Diagnostic mammogram confirmed an irregular isoechoic mass in the outer breast at the middle depth measuring just over 1 cm associated with architectural distortion.  No associated suspicious calcifications.  Targeted ultrasound was performed, demonstrating an irregular hypoechoic mass with vague margins at the 9 o'clock position 4 cm from nipple at middle depth measuring approximately 1.1 x 0.9 x 1 cm demonstrating posterior acoustic shadowing and no internal power Doppler flow corresponding to the screening mammographic finding.  Right axilla demonstrated no pathologic lymphadenopathy. ?Ultrasound of the area showed highly suspicious 1.1 cm mass involving the outer right breast at 9 o'clock position 4 cm from the nipple.  No pathologic right axillary lymphadenopathy. ? ?  ?11/18/2021 Pathology Results  ? Right breast needle core biopsy 9 o'clock position showed invasive carcinoma of no special type, grade 2, 11 mm in greatest length, ER 100% positive strong staining intensity, PR 100% positive strong staining intensity, Ki-67 of 40% and HER2 negative by IHC. ?  ?11/24/2021 Initial Diagnosis  ? Malignant neoplasm of upper-outer quadrant of right breast in female,  estrogen receptor positive ([place]) ?  ? Genetic Testing  ? [facility] CancerNext-Expanded Panel was Negative. 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 Report date is 12/15/2021. ? ?The CancerNext-Expanded gene panel offered by [facility] and includes sequencing, rearrangement, and RNA analysis for the following 77 genes: AIP, ALK, APC, ATM, AXIN2, BAP1, BARD1, BLM, BMPR1A, BRCA1, BRCA2, BRIP1, CDC73, CDH1, CDK4, CDKN1B, CDKN2A, CHEK2, CTNNA1, DICER1, FANCC, FH, FLCN, GALNT12, KIF1B, LZTR1, MAX, MEN1, MET, MLH1, MSH2, MSH3, MSH6, MUTYH, NBN, NF1, NF2, NTHL1, PALB2, PHOX2B, PMS2, POT1, PRKAR1A, PTCH1, PTEN, RAD51C, RAD51D, RB1, RECQL, RET, SDHA, SDHAF2, SDHB, SDHC, SDHD, SMAD4, SMARCA4, SMARCB1, SMARCE1, STK11, SUFU, TMEM127, TP53, TSC1, TSC2, VHL and XRCC2 (sequencing and deletion/duplication); EGFR, EGLN1, HOXB13, KIT, MITF, PDGFRA, POLD1, and POLE (sequencing only); EPCAM and GREM1 (deletion/duplication only).  ?  ?12/15/2021 Surgery  ? She had right breast lumpectomy with sentinel lymph node biopsy.  Pathology showed invasive ductal carcinoma, 1.7 cm, grade 3 along with high-grade DCIS, resection margins are negative for carcinoma sentinel lymph node negative.  Previous prognostic showed ER 100% positive strong staining, PR 100% positive strong staining, KI however at 40%, HER2 negative. ? ?  ?12/29/2021 Oncotype testing  ? Oncotype at 15, distant recurrence risk at 9 years of 4% and absolute chemotherapy benefit less than 1%. ?  ? ? ?FAMILY HISTORY:  ?We obtained a detailed, 4-generation family history.  Significant diagnoses are listed below: ?Family History  ?Problem Relation Age of Onset  ? Breast cancer Paternal Grandmother   ?     dx. 70s  ? Cancer Paternal Grandmother   ?     uterine or ovarian, unsure if breast cancer metastasis or second primary  ? Breast cancer Paternal  Aunt   ?     dx. 51s  ? Colon cancer Paternal Aunt   ? Colon cancer Paternal Uncle   ?     dx. 28s  ?  Lung cancer Paternal Uncle   ? ? ? ? ? ?GENETIC TEST RESULTS:  ?The [facility] CancerNext-Expanded Panel found no pathogenic mutations.  ? ?The CancerNext-Expanded gene panel offered by [facility] and includes sequencing, rearrangement, and RNA analysis for the following 77 genes: AIP, ALK, APC, ATM, AXIN2, BAP1, BARD1, BLM, BMPR1A, BRCA1, BRCA2, BRIP1, CDC73, CDH1, CDK4, CDKN1B, CDKN2A, CHEK2, CTNNA1, DICER1, FANCC, FH, FLCN, GALNT12, KIF1B, LZTR1, MAX, MEN1, MET, MLH1, MSH2, MSH3, MSH6, MUTYH, NBN, NF1, NF2, NTHL1, PALB2, PHOX2B, PMS2, POT1, PRKAR1A, PTCH1, PTEN, RAD51C, RAD51D, RB1, RECQL, RET, SDHA, SDHAF2, SDHB, SDHC, SDHD, SMAD4, SMARCA4, SMARCB1, SMARCE1, STK11, SUFU, TMEM127, TP53, TSC1, TSC2, VHL and XRCC2 (sequencing and deletion/duplication); EGFR, EGLN1, HOXB13, KIT, MITF, PDGFRA, POLD1, and POLE (sequencing only); EPCAM and GREM1 (deletion/duplication only).  ? ?The test report has been scanned into EPIC and is located under the Molecular Pathology section of the Results Review tab.  A portion of the result report is included below for reference. Genetic testing reported out on 12/15/2021. ?  ? ? ? ? 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  At this time, it is unknown if this variant is associated with an increased risk for cancer or if it is benign, but most uncertain variants are reclassified to benign. It should not be used to make medical management decisions. With time, we suspect the laboratory will determine the significance of this variant, if any. If the laboratory reclassifies this variant, we will attempt to contact Ms. Dillow to discuss it further.  ? ?Even though a pathogenic variant was not identified, possible explanations for the cancer in the family may include: ?There may be no hereditary risk for cancer in the family. The cancers in Ms. Thurow and/or her family may be due to other genetic or environmental  factors. ?There may be a gene mutation in one of these genes that current testing methods cannot detect, but that chance is small. ?There could be another gene that has not yet been discovered, or that we have not yet tested, that is responsible for the cancer diagnoses in the family.  ?It is also possible there is a hereditary cause for the cancer in the family that Ms. Hereford did not inherit. ?The variant of uncertain significance detected in the ATM gene may be reclassified as a pathogenic variant in the future. At this time, we do not know if this variant increases the risk for cancer. ? ?Therefore, it is important to remain in touch with cancer genetics in the future so that we can continue to offer Ms. Fritchman the most up to date genetic testing.  ? ?ADDITIONAL GENETIC TESTING:  ?We discussed with Ms. Grimmer that her genetic testing was fairly extensive.  If there are genes identified to increase cancer risk that can be analyzed in the future, we would be happy to discuss and coordinate this testing at that time.   ? ?CANCER SCREENING RECOMMENDATIONS:  ?Ms. Delap's test result is considered negative (normal).  This means that we have not identified a hereditary cause for her personal and family history of cancer at this time.  ? ?An individual's cancer risk and medical management are not determined by genetic test results alone. Overall cancer risk assessment incorporates additional factors, including personal  medical history, family history, and any available genetic information that may result in a personalized plan for cancer prevention and surveillance. Therefore, it is recommended she continue to follow the cancer management and screening guidelines provided by her oncology and primary healthcare provider. ? ?RECOMMENDATIONS FOR FAMILY MEMBERS:   ?Since she did not inherit a mutation in a cancer predisposition gene included on this panel, her children could not have inherited a  mutation from her in one of these genes. ?Individuals in this family might be at some increased risk of developing cancer, over the general population risk, due to the family history of cancer. We recommend women i

## 2022-01-05 ENCOUNTER — Encounter (HOSPITAL_COMMUNITY): Payer: Self-pay

## 2022-01-09 ENCOUNTER — Encounter: Payer: Self-pay | Admitting: *Deleted

## 2022-01-09 ENCOUNTER — Telehealth: Payer: Self-pay | Admitting: *Deleted

## 2022-01-09 DIAGNOSIS — C50411 Malignant neoplasm of upper-outer quadrant of right female breast: Secondary | ICD-10-CM

## 2022-01-09 NOTE — Telephone Encounter (Signed)
Received oncotype score of 15. Physician team notified. Called pt, left vm with results and chemo not recommended. Next step is xrt with Dr. Isidore Moos and provided appt date and time. Request return call with questions or needs. Contact information provided. ?

## 2022-01-16 ENCOUNTER — Encounter: Payer: Self-pay | Admitting: *Deleted

## 2022-01-20 NOTE — Progress Notes (Signed)
Radiation Oncology         [phone] ________________________________  Name: Claire Bernard MRN: [id number]  Date: 01/21/2022  DOB: Sep 19, 1950  Follow-Up Visit Note  Outpatient  CC: Leonard Downing, MD  Benay Pike, MD  Diagnosis:   No diagnosis found.   Right Breast UOQ, Invasive ductal carcinoma with high-grade DCIS, ER+ / PR+ / Her2-, Grade 3   Cancer Staging  Malignant neoplasm of upper-outer quadrant of right breast in female, estrogen receptor positive ([place]) Staging form: Breast, AJCC 8th Edition - Clinical stage from 11/26/2021: Stage IA (cT1c, cN0, cM0, G2, ER+, PR+, HER2-) - Unsigned  CHIEF COMPLAINT: Here to discuss management of right breast cancer  Narrative:  The patient returns today for follow-up.     Since breast clinic consultation date on 11/26/21, she underwent genetic testing on that same date which revealed no clinically significant variants detected. However, a variant of unknown significance was detected (p.K224E) in the ATM gene by BRCAplus and +RNAinsight testing.  The patient opted to proceed with right breast lumpectomy with nodal biopsies on 12/15/21 under the care of Dr. Ninfa Linden. Pathology revealed: tumor size of 1.7 cm; histology of grade 3 invasive ductal carcinoma with high-grade DCIS; all margins negative for both invasive and in-situ carcinoma; margin status to invasive disease of focally less than 1 mm from the inferior margin and 2 mm from the junction of the posterior and inferior margins; margin status to in situ disease of 2 mm from the inferior margin; nodal status of 1/1 right axillary sentinel lymph node negative for carcinoma;  ER status: 100% positive; PR status 100% positive (both with strong staining intensity); Proliferation marker Ki67 at 40%; Her2 status negative; Grade 3.  Oncotype DX was obtained on the final surgical sample and the recurrence score of 15 predicts a risk of recurrence outside the breast over the next  9 years of 4%, if the patient's only systemic therapy is an antiestrogen for 5 years.  It also predicts no significant benefit from chemotherapy.  During a post-op follow up on 12/22/21, the patient was found to have some cellulitis around the lumpectomy incision but no sign of obvious seroma or abscess. She was given doxycycline and instructed to return for follow-up in 1 week. Cellulitis showed improvement on examination during follow-up on 12/30/21.   The patient most recently followed up with Dr. Chryl Heck on 12/31/21. Given her Oncotype results, the patient will proceed with antiestrogens following XRT. The patient tolerated anastrozole very well in the past and is agreeable to take this again. She will return to Dr. Chryl Heck in mid-July to initiate antiestrogens.   Symptomatically, the patient reports: ***  PREVIOUS RADIATION THERAPY: She received several weeks of postlumpectomy radiation therapy to the contralateral, left breast in 2005.         ALLERGIES:  is allergic to caine-1 [lidocaine hcl] and procaine hcl.  Meds: Current Outpatient Medications  Medication Sig Dispense Refill   busPIRone (BUSPAR) 5 MG tablet Take 5 mg by mouth 2 (two) times daily.     cetirizine (ZYRTEC) 10 MG tablet Take 10 mg by mouth daily.     estradiol (ESTRACE VAGINAL) 0.1 MG/GM vaginal cream INSERT ONE-HALF GRAM INTRAVAGINALLY TWO TIMES A WEEK AT BEDTIME 43 g 1   hydrochlorothiazide (HYDRODIURIL) 25 MG tablet Take 25 mg by mouth daily.     lisinopril (ZESTRIL) 20 MG tablet Take 20 mg by mouth daily.     naproxen (NAPROSYN) 500 MG tablet Take 500 mg  by mouth 2 (two) times daily with a meal.     traMADol (ULTRAM) 50 MG tablet Take 1 tablet (50 mg total) by mouth every 6 (six) hours as needed for moderate pain or severe pain. 20 tablet 0   traZODone (DESYREL) 150 MG tablet as directed.      No current facility-administered medications for this encounter.    Physical Findings:  vitals were not taken for this  visit. .     General: Alert and oriented, in no acute distress HEENT: Head is normocephalic. Extraocular movements are intact. Oropharynx is clear. Neck: Neck is supple, no palpable cervical or supraclavicular lymphadenopathy. Heart: Regular in rate and rhythm with no murmurs, rubs, or gallops. Chest: Clear to auscultation bilaterally, with no rhonchi, wheezes, or rales. Abdomen: Soft, nontender, nondistended, with no rigidity or guarding. Extremities: No cyanosis or edema. Lymphatics: see Neck Exam Musculoskeletal: symmetric strength and muscle tone throughout. Neurologic: No obvious focalities. Speech is fluent.  Psychiatric: Judgment and insight are intact. Affect is appropriate. Breast exam reveals ***  Lab Findings: Lab Results  Component Value Date   WBC 8.6 11/26/2021   HGB 13.2 11/26/2021   HCT 37.2 11/26/2021   MCV 88.8 11/26/2021   PLT 252 11/26/2021    @LASTCHEMISTRY @  Radiographic Findings: No results found.  Impression/Plan: We discussed adjuvant radiotherapy today.  I recommend *** in order to ***.  I reviewed the logistics, benefits, risks, and potential side effects of this treatment in detail. Risks may include but not necessary be limited to acute and late injury tissue in the radiation fields such as skin irritation (change in color/pigmentation, itching, dryness, pain, peeling). She may experience fatigue. We also discussed possible risk of long term cosmetic changes or scar tissue. There is also a smaller risk for lung toxicity, ***cardiac toxicity, ***brachial plexopathy, ***lymphedema, ***musculoskeletal changes, ***rib fragility or ***induction of a second malignancy, ***late chronic non-healing soft tissue wound.    The patient asked good questions which I answered to her satisfaction. She is enthusiastic about proceeding with treatment. A consent form has been *** signed and placed in her chart.  A total of *** medically necessary complex treatment devices  will be fabricated and supervised by me: *** fields with MLCs for custom blocks to protect heart, and lungs;  and, a Vac-lok. MORE COMPLEX DEVICES MAY BE MADE IN DOSIMETRY FOR FIELD IN FIELD BEAMS FOR DOSE HOMOGENEITY.  I have requested : 3D Simulation which is medically necessary to give adequate dose to at risk tissues while sparing lungs and heart.  I have requested a DVH of the following structures: lungs, heart, *** lumpectomy cavity.    The patient will receive *** Gy in *** fractions to the *** with *** fields.  This will be *** followed by a boost.  On date of service, in total, I spent *** minutes on this encounter. Patient was seen in person.  _____________________________________   Eppie Gibson, MD  This document serves as a record of services personally performed by Eppie Gibson, MD. It was created on her behalf by Roney Mans, a trained medical scribe. The creation of this record is based on the scribe's personal observations and the provider's statements to them. This document has been checked and approved by the attending provider.

## 2022-01-20 NOTE — Progress Notes (Incomplete)
Location of Breast Cancer:  Malignant neoplasm of upper-outer quadrant of right breast in female, estrogen receptor positive  Histology per Pathology Report:  12/15/2021 FINAL MICROSCOPIC DIAGNOSIS:  A. BREAST, RIGHT, LUMPECTOMY:  - Invasive ductal carcinoma, 1.7 cm, grade 3  - Ductal carcinoma in situ, high-grade  - Resection margins are negative for carcinoma - carcinoma is focally less than 1 mm from the inferior margin and 2 mm from the junction of the posterior and inferior margins  - Biopsy site changes  - See oncology table  B. LYMPH NODE, RIGHT AXILLARY, SENTINEL, EXCISION:  - Lymph node, negative for carcinoma (0/1)   Receptor Status: ER(100%), PR (100%), Her2-neu (Negative), Ki-67(40%)  Did patient present with symptoms (if so, please note symptoms) or was this found on screening mammography?: Screening mammogram showed a possible mass in the right breast.  Diagnostic mammogram confirmed an irregular isoechoic mass in the outer breast at the middle depth measuring just over 1 cm associated with architectural distortion.  No associated suspicious calcifications  Past/Anticipated interventions by surgeon, if any:  12/30/2021 --Puja Maczis, PA-C (office visit) She is status post right breast lumpectomy and sentinel lymph node biopsy on 12/15/2021 by Dr. Ninfa Linden.  On exam, the cellulitis has improved. Dr. Ninfa Linden also evaluated her and discussed the pathology.  She will continue to monitor the area and will call with any questions or concerns before follow-up.  --Return for appt with surgeon as planned.  12/15/2021 --Dr. Vicente Males Louie/Dr. Coralie Keens Right breast lumpectomy with sentinel lymph node biopsy  Past/Anticipated interventions by medical oncology, if any:  Under care of Dr. Arletha Pili Iruku --12/31/2021 Oncotype score resulted at 44, distant risk of recurrence at 9 years is about 4% with AI or tamoxifen and absolute chemotherapy benefit less than 1% in this  group. Given lack of benefit with chemotherapy, she will proceed with adjuvant radiation which will be followed by antiestrogen therapy.   She has tolerated anastrozole very well in the past and she would want to go back on the same medication. Sent an in basket message to Dr. Isidore Moos for scheduling adjuvant radiation.   She will return to clinic to follow-up with [place] in mid July to initiate antiestrogen therapy  Lymphedema issues, if any:  ***    Pain issues, if any:  ***   SAFETY ISSUES: Prior radiation? ***Yes: 2005 to left breast Pacemaker/ICD? *** Possible current pregnancy? No--Hysterectomy Is the patient on methotrexate? ***  Current Complaints / other details:  ***

## 2022-01-21 ENCOUNTER — Ambulatory Visit
Admission: RE | Admit: 2022-01-21 | Discharge: 2022-01-21 | Disposition: A | Discharge status: Home / Self Care | Payer: Medicare Other | Source: Ambulatory Visit | Attending: Radiation Oncology | Admitting: Radiation Oncology

## 2022-01-21 ENCOUNTER — Encounter: Payer: Self-pay | Admitting: Radiation Oncology

## 2022-01-21 ENCOUNTER — Other Ambulatory Visit: Payer: Self-pay

## 2022-01-21 VITALS — BP 133/69 | HR 77 | Resp 17 | Wt 192.1 lb

## 2022-01-21 DIAGNOSIS — Z170 Estrogen receptor positive status [ER+]: Secondary | ICD-10-CM | POA: Diagnosis not present

## 2022-01-21 DIAGNOSIS — Z79899 Other long term (current) drug therapy: Secondary | ICD-10-CM | POA: Insufficient documentation

## 2022-01-21 DIAGNOSIS — C50411 Malignant neoplasm of upper-outer quadrant of right female breast: Secondary | ICD-10-CM | POA: Insufficient documentation

## 2022-01-21 DIAGNOSIS — Z510 Encounter for antineoplastic radiation therapy: Secondary | ICD-10-CM | POA: Insufficient documentation

## 2022-01-21 NOTE — Therapy (Signed)
OUTPATIENT PHYSICAL THERAPY BREAST CANCER POST OP FOLLOW UP   Patient Name: Claire Bernard MRN: [id number] DOB:[birth year], 71 y.o., female Today's Date: 01/22/2022   PT End of Session - 01/22/22 1007     Visit Number 7    Number of Visits 9    PT Start Time 1002    PT Stop Time 1038    PT Time Calculation (min) 36 min    Activity Tolerance Patient tolerated treatment well    Behavior During Therapy [facility] for tasks assessed/performed             Past Medical History:  Diagnosis Date   Cancer ([place]) 07/2004   breast cancer   Chest pressure    Clotting disorder ([place]) 1970's   from birthcontrol    Esophageal spasm    Fatigue    GERD (gastroesophageal reflux disease)    History of dizziness    Hyperlipidemia    Hypertension    Laryngitis    Low bone mass    Personal history of radiation therapy 07/2004   left breast   Pre-diabetes    Past Surgical History:  Procedure Laterality Date   ANKLE FRACTURE SURGERY     BREAST LUMPECTOMY  07/17/2004   left breast   BREAST LUMPECTOMY WITH RADIOACTIVE SEED AND SENTINEL LYMPH NODE BIOPSY Right 12/15/2021   Procedure: RIGHT BREAST LUMPECTOMY WITH RADIOACTIVE SEED AND SENTINEL LYMPH NODE BIOPSY;  Surgeon: Coralie Keens, MD;  Location: [place];  Service: General;  Laterality: Right;   cataract surg     KNEE SURGERY     Arthroscopic   SHOULDER SURGERY     VAGINAL HYSTERECTOMY     Patient Active Problem List   Diagnosis Date Noted   Vaginal atrophy 12/08/2021   History of osteopenia 12/08/2021   Genetic testing 12/04/2021   Malignant neoplasm of upper-outer quadrant of right breast in female, estrogen receptor positive ([place]) 11/24/2021   History of ductal carcinoma in situ (DCIS) of breast 10/04/2019   Low bone mass    Hyperlipidemia    Hypertension    Fatigue    Chest pressure    History of dizziness    Esophageal spasm    GERD (gastroesophageal reflux disease)    Laryngitis     REFERRING  PROVIDER: Dr. Coralie Keens  REFERRING DIAG: Right breast cancer  THERAPY DIAG:  Aftercare following surgery for neoplasm  Abnormal posture  Stiffness of right shoulder, not elsewhere classified  Chronic right shoulder pain  Malignant neoplasm of upper-outer quadrant of right breast in female, estrogen receptor positive ([place])  Rationale for Evaluation and Treatment Rehabilitation  ONSET DATE: 12/15/2021  SUBJECTIVE:  SUBJECTIVE STATEMENT: Patient reports she underwent a right lumpectomy and sentinel node biopsy (1 negative node) on 12/15/2021. Her Oncotype score was 15 so she will proceed to radiation. She reports laying on her right side is still causing a lot of shoulder pain and she had difficulty with getting positioned for radiation simulation.  PERTINENT HISTORY:  Patient was diagnosed on 10/30/2021 with right grade II invasive ductal carcinoma breast cancer. She underwent a right lumpectomy and sentinel node biopsy (1 negative node) on 12/15/2021 It is ER/PR positive and HER2 negative with a Ki67 of 40%. She had left breast cancer in 2005 which was treated with surgery, radiation, and anti-estrogen therapy. No axillary nodes removed per her report.  PATIENT GOALS:  Reassess how my recovery is going related to arm function, pain, and swelling. Marland Kitchen PAIN:  Are you having pain? No  PRECAUTIONS: Recent Surgery, right UE Lymphedema risk; right shoulder pain  ACTIVITY LEVEL / LEISURE: She walks 3x/week for 15 minutes   OBJECTIVE:   PATIENT SURVEYS:  QUICK DASH:  Quick Dash - 01/22/22 0001     Open a tight or new jar Moderate difficulty    Do heavy household chores (wash walls, wash floors) Moderate difficulty    Carry a shopping bag or briefcase Moderate difficulty    Wash your back Moderate  difficulty    Use a knife to cut food No difficulty    Recreational activities in which you take some force or impact through your arm, shoulder, or hand (golf, hammering, tennis) Moderate difficulty    During the past week, to what extent has your arm, shoulder or hand problem interfered with your normal social activities with family, friends, neighbors, or groups? Slightly    During the past week, to what extent has your arm, shoulder or hand problem limited your work or other regular daily activities Slightly    Arm, shoulder, or hand pain. Moderate    Tingling (pins and needles) in your arm, shoulder, or hand None    Difficulty Sleeping Mild difficulty    DASH Score 34.09 %              OBSERVATIONS:  Both right axillary and breast incision both appear to be healing well with redness present at incision sites. No edema present. Slightly tender to palpation on right lateral breast incision.  POSTURE:  Forward head and rounded shoulders   UPPER EXTREMITY AROM/PROM:   A/PROM RIGHT  11/26/2021   RIGHT 01/22/2022  Shoulder extension 42 46  Shoulder flexion 132 PAINFUL 132  Shoulder abduction 141 PAINFUL 144  Shoulder internal rotation 65 PAINFUL 62  Shoulder external rotation 85 PAINFUL 88                          (Blank rows = not tested)   A/PROM LEFT  11/26/2021  Shoulder extension 50  Shoulder flexion 150  Shoulder abduction 155  Shoulder internal rotation 83  Shoulder external rotation 87                          (Blank rows = not tested)      LYMPHEDEMA ASSESSMENTS:    LANDMARK RIGHT  11/26/2021 RIGHT 01/22/2022  10 cm proximal to olecranon process 29.8 29.8  Olecranon process 26.6 26.8  10 cm proximal to ulnar styloid process 22.4 22.4  Just proximal to ulnar styloid process 15.9 15.8  Across hand at thumb web space  18.4 18.8  At base of 2nd digit 6.3 6.2  (Blank rows = not tested)   LANDMARK LEFT  11/26/2021 RIGHT 01/22/2022  10 cm proximal to olecranon process  30.1 30.4  Olecranon process 26.2 26.2  10 cm proximal to ulnar styloid process 21.8 22  Just proximal to ulnar styloid process 16.2 16  Across hand at thumb web space 18.6 18.3  At base of 2nd digit 6.4 6.4  (Blank rows = not tested)         Surgery type/Date: 12/15/2021 Number of lymph nodes removed: 1 Current/past treatment (chemo, radiation, hormone therapy): none Other symptoms:  Heaviness/tightness Yes Pain Yes Pitting edema No Infections Yes - she had 1 infection after surgery at incision site treated with 7 days of antibiotics Decreased scar mobility Yes Stemmer sign No   PATIENT EDUCATION:  Education details: HEP and lymphedema education Person educated: Patient Education method: Customer service manager Education comprehension: verbalized understanding and returned demonstration   HOME EXERCISE PROGRAM:  Reviewed previously given post op HEP.   ASSESSMENT:  CLINICAL IMPRESSION: Patient is doing well s/p right lumpectomy and sentinel node biopsy on 12/15/2021. She does not need chemo but will proceed with radiation. Her incisions have healed well, her shoulder ROM is back to baseline (limited by pre-existing condition), and her function is back to baseline. She has no signs of lymphedema. She declines further treatment for shoulder pain. She plans to attend the After Breast Cancer class for lymphedema risk reduction and return for SOZO screens.  Pt will benefit from skilled therapeutic intervention to improve on the following deficits: Decreased knowledge of precautions, impaired UE functional use, pain, decreased ROM, postural dysfunction.   PT treatment/interventions: ADL/Self care home management, Therapeutic exercises, Therapeutic activity, Neuromuscular re-education, Balance training, Gait training, Patient/Family education, and Joint mobilization     GOALS: Goals reviewed with patient? Yes  LONG TERM GOALS:  (STG=LTG)  GOALS Name Target Date   Goal status  1 Pt will demonstrate she has regained full shoulder ROM and function post operatively compared to baselines.  Baseline: 01/21/2022 MET     PLAN: PT FREQUENCY/DURATION: N/A  PLAN FOR NEXT SESSION: D/C   [facility]  [address], [address]  [place] [address]  [phone]  After Breast Cancer Class It is recommended you attend the ABC class to be educated on lymphedema risk reduction. This class is free of charge and lasts for 1 hour. It is a 1-time class. You will need to download the Webex app either on your phone or computer. We will send you a link the night before or the morning of the class. You should be able to click on that link to join the class. This is not a confidential class. You don't have to turn your camera on, but other participants may be able to see your email address. You are scheduled for June 5th at 11:00.  Scar massage You can begin gentle scar massage to you incision sites. Gently place one hand on the incision and move the skin (without sliding on the skin) in various directions. Do this for a few minutes and then you can gently massage either coconut oil or vitamin E cream into the scars.  Compression garment You should continue wearing your compression bra until you feel like you no longer have swelling.  Home exercise Program Continue doing the exercises you were given until you feel like you can do them without feeling any tightness at the end.  Walking Program Studies show that 30 minutes of walking per day (fast enough to elevate your heart rate) can significantly reduce the risk of a cancer recurrence. If you can't walk due to other medical reasons, we encourage you to find another activity you could do (like a stationary bike or water exercise).  Posture After breast cancer surgery, people frequently sit with rounded shoulders posture because it puts their incisions on slack and feels better. If you sit like this  and scar tissue forms in that position, you can become very tight and have pain sitting or standing with good posture. Try to be aware of your posture and sit and stand up tall to heal properly.  Follow up PT: It is recommended you return every 3 months for the first 3 years following surgery to be assessed on the SOZO machine for an L-Dex score. This helps prevent clinically significant lymphedema in 95% of patients. These follow up screens are 10 minute appointments that you are not billed for. You are scheduled for July 17th at 12:30.  PHYSICAL THERAPY DISCHARGE SUMMARY  Visits from Start of Care: 7  Current functional level related to goals / functional outcomes: Goals met related to breast cancer. Still limited with shoulder function.   Remaining deficits: Shoulder ROM, pain and functional limitations. Pt does not want to resume PT and feels she can manager her shoulder issues independently.   Education / Equipment: HEP and lymphedema risk reduction.   Patient agrees to discharge. Patient goals were met. Patient is being discharged due to being pleased with the current functional level.  Annia Friendly, [place] 01/22/22 10:38 AM

## 2022-01-22 ENCOUNTER — Ambulatory Visit: Payer: Medicare Other | Attending: Surgery | Admitting: Physical Therapy

## 2022-01-22 ENCOUNTER — Encounter: Payer: Self-pay | Admitting: Physical Therapy

## 2022-01-22 DIAGNOSIS — R293 Abnormal posture: Secondary | ICD-10-CM | POA: Diagnosis present

## 2022-01-22 DIAGNOSIS — C50411 Malignant neoplasm of upper-outer quadrant of right female breast: Secondary | ICD-10-CM | POA: Diagnosis present

## 2022-01-22 DIAGNOSIS — G8929 Other chronic pain: Secondary | ICD-10-CM | POA: Insufficient documentation

## 2022-01-22 DIAGNOSIS — Z483 Aftercare following surgery for neoplasm: Secondary | ICD-10-CM | POA: Insufficient documentation

## 2022-01-22 DIAGNOSIS — M25611 Stiffness of right shoulder, not elsewhere classified: Secondary | ICD-10-CM | POA: Insufficient documentation

## 2022-01-22 DIAGNOSIS — Z170 Estrogen receptor positive status [ER+]: Secondary | ICD-10-CM | POA: Diagnosis present

## 2022-01-22 DIAGNOSIS — M25511 Pain in right shoulder: Secondary | ICD-10-CM | POA: Diagnosis present

## 2022-01-22 NOTE — Patient Instructions (Signed)
[facility]  [address], [address]  [place] [address]  [phone]  After Breast Cancer Class It is recommended you attend the ABC class to be educated on lymphedema risk reduction. This class is free of charge and lasts for 1 hour. It is a 1-time class. You will need to download the Webex app either on your phone or computer. We will send you a link the night before or the morning of the class. You should be able to click on that link to join the class. This is not a confidential class. You don't have to turn your camera on, but other participants may be able to see your email address. You are scheduled for June 5th at 11:00.  Scar massage You can begin gentle scar massage to you incision sites. Gently place one hand on the incision and move the skin (without sliding on the skin) in various directions. Do this for a few minutes and then you can gently massage either coconut oil or vitamin E cream into the scars.  Compression garment You should continue wearing your compression bra until you feel like you no longer have swelling.  Home exercise Program Continue doing the exercises you were given until you feel like you can do them without feeling any tightness at the end.   Walking Program Studies show that 30 minutes of walking per day (fast enough to elevate your heart rate) can significantly reduce the risk of a cancer recurrence. If you can't walk due to other medical reasons, we encourage you to find another activity you could do (like a stationary bike or water exercise).  Posture After breast cancer surgery, people frequently sit with rounded shoulders posture because it puts their incisions on slack and feels better. If you sit like this and scar tissue forms in that position, you can become very tight and have pain sitting or standing with good posture. Try to be aware of your posture and sit and stand up tall to heal properly.  Follow up PT: It is  recommended you return every 3 months for the first 3 years following surgery to be assessed on the SOZO machine for an L-Dex score. This helps prevent clinically significant lymphedema in 95% of patients. These follow up screens are 10 minute appointments that you are not billed for. You are scheduled for July 17th at 12:30.

## 2022-01-27 DIAGNOSIS — Z510 Encounter for antineoplastic radiation therapy: Secondary | ICD-10-CM | POA: Diagnosis not present

## 2022-01-28 ENCOUNTER — Other Ambulatory Visit: Payer: Self-pay

## 2022-01-28 ENCOUNTER — Ambulatory Visit
Admission: RE | Admit: 2022-01-28 | Discharge: 2022-01-28 | Disposition: A | Discharge status: Home / Self Care | Payer: Medicare Other | Source: Ambulatory Visit | Attending: Radiation Oncology | Admitting: Radiation Oncology

## 2022-01-28 ENCOUNTER — Encounter: Payer: Self-pay | Admitting: *Deleted

## 2022-01-28 DIAGNOSIS — Z510 Encounter for antineoplastic radiation therapy: Secondary | ICD-10-CM | POA: Diagnosis not present

## 2022-01-28 LAB — RAD ONC ARIA SESSION SUMMARY
Course Elapsed Days: 0
Plan Fractions Treated to Date: 1
Plan Prescribed Dose Per Fraction: 2.67 Gy
Plan Total Fractions Prescribed: 15
Plan Total Prescribed Dose: 40.05 Gy
Reference Point Dosage Given to Date: 2.67 Gy
Reference Point Session Dosage Given: 2.67 Gy
Session Number: 1

## 2022-01-29 ENCOUNTER — Ambulatory Visit
Admission: RE | Admit: 2022-01-29 | Discharge: 2022-01-29 | Disposition: A | Discharge status: Home / Self Care | Payer: Medicare Other | Source: Ambulatory Visit | Attending: Radiation Oncology | Admitting: Radiation Oncology

## 2022-01-29 ENCOUNTER — Other Ambulatory Visit: Payer: Self-pay

## 2022-01-29 DIAGNOSIS — Z170 Estrogen receptor positive status [ER+]: Secondary | ICD-10-CM | POA: Diagnosis present

## 2022-01-29 DIAGNOSIS — C50411 Malignant neoplasm of upper-outer quadrant of right female breast: Secondary | ICD-10-CM | POA: Diagnosis present

## 2022-01-29 LAB — RAD ONC ARIA SESSION SUMMARY
Course Elapsed Days: 1
Plan Fractions Treated to Date: 2
Plan Prescribed Dose Per Fraction: 2.67 Gy
Plan Total Fractions Prescribed: 15
Plan Total Prescribed Dose: 40.05 Gy
Reference Point Dosage Given to Date: 5.34 Gy
Reference Point Session Dosage Given: 2.67 Gy
Session Number: 2

## 2022-01-30 ENCOUNTER — Ambulatory Visit
Admission: RE | Admit: 2022-01-30 | Discharge: 2022-01-30 | Disposition: A | Discharge status: Home / Self Care | Payer: Medicare Other | Source: Ambulatory Visit | Attending: Radiation Oncology | Admitting: Radiation Oncology

## 2022-01-30 ENCOUNTER — Other Ambulatory Visit: Payer: Self-pay

## 2022-01-30 DIAGNOSIS — C50411 Malignant neoplasm of upper-outer quadrant of right female breast: Secondary | ICD-10-CM | POA: Diagnosis not present

## 2022-01-30 LAB — RAD ONC ARIA SESSION SUMMARY
Course Elapsed Days: 2
Plan Fractions Treated to Date: 3
Plan Prescribed Dose Per Fraction: 2.67 Gy
Plan Total Fractions Prescribed: 15
Plan Total Prescribed Dose: 40.05 Gy
Reference Point Dosage Given to Date: 8.01 Gy
Reference Point Session Dosage Given: 2.67 Gy
Session Number: 3

## 2022-02-02 ENCOUNTER — Ambulatory Visit: Payer: Medicare Other

## 2022-02-02 ENCOUNTER — Other Ambulatory Visit: Payer: Self-pay

## 2022-02-02 ENCOUNTER — Ambulatory Visit
Admission: RE | Admit: 2022-02-02 | Discharge: 2022-02-02 | Disposition: A | Discharge status: Home / Self Care | Payer: Medicare Other | Source: Ambulatory Visit | Attending: Radiation Oncology | Admitting: Radiation Oncology

## 2022-02-02 DIAGNOSIS — Z170 Estrogen receptor positive status [ER+]: Secondary | ICD-10-CM

## 2022-02-02 DIAGNOSIS — C50411 Malignant neoplasm of upper-outer quadrant of right female breast: Secondary | ICD-10-CM | POA: Diagnosis not present

## 2022-02-02 LAB — RAD ONC ARIA SESSION SUMMARY
Course Elapsed Days: 5
Plan Fractions Treated to Date: 4
Plan Prescribed Dose Per Fraction: 2.67 Gy
Plan Total Fractions Prescribed: 15
Plan Total Prescribed Dose: 40.05 Gy
Reference Point Dosage Given to Date: 10.68 Gy
Reference Point Session Dosage Given: 2.67 Gy
Session Number: 4

## 2022-02-02 MED ORDER — ALRA NON-METALLIC DEODORANT (RAD-ONC)
1.0000 "application " | Freq: Once | TOPICAL | Status: AC
  Administered 2022-02-02: 1 via TOPICAL

## 2022-02-02 MED ORDER — RADIAPLEXRX EX GEL: Freq: Once | CUTANEOUS | Status: AC

## 2022-02-02 NOTE — Progress Notes (Signed)

## 2022-02-03 ENCOUNTER — Ambulatory Visit
Admission: RE | Admit: 2022-02-03 | Discharge: 2022-02-03 | Disposition: A | Discharge status: Home / Self Care | Payer: Medicare Other | Source: Ambulatory Visit | Attending: Radiation Oncology | Admitting: Radiation Oncology

## 2022-02-03 ENCOUNTER — Other Ambulatory Visit: Payer: Self-pay

## 2022-02-03 DIAGNOSIS — C50411 Malignant neoplasm of upper-outer quadrant of right female breast: Secondary | ICD-10-CM | POA: Diagnosis not present

## 2022-02-03 LAB — RAD ONC ARIA SESSION SUMMARY
Course Elapsed Days: 6
Plan Fractions Treated to Date: 5
Plan Prescribed Dose Per Fraction: 2.67 Gy
Plan Total Fractions Prescribed: 15
Plan Total Prescribed Dose: 40.05 Gy
Reference Point Dosage Given to Date: 13.35 Gy
Reference Point Session Dosage Given: 2.67 Gy
Session Number: 5

## 2022-02-04 ENCOUNTER — Other Ambulatory Visit: Payer: Self-pay

## 2022-02-04 ENCOUNTER — Ambulatory Visit
Admission: RE | Admit: 2022-02-04 | Discharge: 2022-02-04 | Disposition: A | Discharge status: Home / Self Care | Payer: Medicare Other | Source: Ambulatory Visit | Attending: Radiation Oncology | Admitting: Radiation Oncology

## 2022-02-04 DIAGNOSIS — C50411 Malignant neoplasm of upper-outer quadrant of right female breast: Secondary | ICD-10-CM | POA: Diagnosis not present

## 2022-02-04 LAB — RAD ONC ARIA SESSION SUMMARY
Course Elapsed Days: 7
Plan Fractions Treated to Date: 6
Plan Prescribed Dose Per Fraction: 2.67 Gy
Plan Total Fractions Prescribed: 15
Plan Total Prescribed Dose: 40.05 Gy
Reference Point Dosage Given to Date: 16.02 Gy
Reference Point Session Dosage Given: 2.67 Gy
Session Number: 6

## 2022-02-05 ENCOUNTER — Other Ambulatory Visit: Payer: Self-pay

## 2022-02-05 ENCOUNTER — Ambulatory Visit
Admission: RE | Admit: 2022-02-05 | Discharge: 2022-02-05 | Disposition: A | Discharge status: Home / Self Care | Payer: Medicare Other | Source: Ambulatory Visit | Attending: Radiation Oncology | Admitting: Radiation Oncology

## 2022-02-05 DIAGNOSIS — C50411 Malignant neoplasm of upper-outer quadrant of right female breast: Secondary | ICD-10-CM | POA: Diagnosis not present

## 2022-02-05 LAB — RAD ONC ARIA SESSION SUMMARY
Course Elapsed Days: 8
Plan Fractions Treated to Date: 7
Plan Prescribed Dose Per Fraction: 2.67 Gy
Plan Total Fractions Prescribed: 15
Plan Total Prescribed Dose: 40.05 Gy
Reference Point Dosage Given to Date: 18.69 Gy
Reference Point Session Dosage Given: 2.67 Gy
Session Number: 7

## 2022-02-06 ENCOUNTER — Ambulatory Visit
Admission: RE | Admit: 2022-02-06 | Discharge: 2022-02-06 | Disposition: A | Discharge status: Home / Self Care | Payer: Medicare Other | Source: Ambulatory Visit | Attending: Radiation Oncology | Admitting: Radiation Oncology

## 2022-02-06 ENCOUNTER — Other Ambulatory Visit: Payer: Self-pay

## 2022-02-06 DIAGNOSIS — C50411 Malignant neoplasm of upper-outer quadrant of right female breast: Secondary | ICD-10-CM | POA: Diagnosis not present

## 2022-02-06 LAB — RAD ONC ARIA SESSION SUMMARY
Course Elapsed Days: 9
Plan Fractions Treated to Date: 8
Plan Prescribed Dose Per Fraction: 2.67 Gy
Plan Total Fractions Prescribed: 15
Plan Total Prescribed Dose: 40.05 Gy
Reference Point Dosage Given to Date: 21.36 Gy
Reference Point Session Dosage Given: 2.67 Gy
Session Number: 8

## 2022-02-09 ENCOUNTER — Other Ambulatory Visit: Payer: Self-pay

## 2022-02-09 ENCOUNTER — Ambulatory Visit
Admission: RE | Admit: 2022-02-09 | Discharge: 2022-02-09 | Disposition: A | Discharge status: Home / Self Care | Payer: Medicare Other | Source: Ambulatory Visit | Attending: Radiation Oncology | Admitting: Radiation Oncology

## 2022-02-09 DIAGNOSIS — C50411 Malignant neoplasm of upper-outer quadrant of right female breast: Secondary | ICD-10-CM | POA: Diagnosis not present

## 2022-02-09 LAB — RAD ONC ARIA SESSION SUMMARY
Course Elapsed Days: 12
Plan Fractions Treated to Date: 9
Plan Prescribed Dose Per Fraction: 2.67 Gy
Plan Total Fractions Prescribed: 15
Plan Total Prescribed Dose: 40.05 Gy
Reference Point Dosage Given to Date: 24.03 Gy
Reference Point Session Dosage Given: 2.67 Gy
Session Number: 9

## 2022-02-10 ENCOUNTER — Other Ambulatory Visit: Payer: Self-pay

## 2022-02-10 ENCOUNTER — Ambulatory Visit
Admission: RE | Admit: 2022-02-10 | Discharge: 2022-02-10 | Disposition: A | Discharge status: Home / Self Care | Payer: Medicare Other | Source: Ambulatory Visit | Attending: Radiation Oncology | Admitting: Radiation Oncology

## 2022-02-10 DIAGNOSIS — C50411 Malignant neoplasm of upper-outer quadrant of right female breast: Secondary | ICD-10-CM | POA: Diagnosis not present

## 2022-02-10 LAB — RAD ONC ARIA SESSION SUMMARY
Course Elapsed Days: 13
Plan Fractions Treated to Date: 10
Plan Prescribed Dose Per Fraction: 2.67 Gy
Plan Total Fractions Prescribed: 15
Plan Total Prescribed Dose: 40.05 Gy
Reference Point Dosage Given to Date: 26.7 Gy
Reference Point Session Dosage Given: 2.67 Gy
Session Number: 10

## 2022-02-11 ENCOUNTER — Ambulatory Visit
Admission: RE | Admit: 2022-02-11 | Discharge: 2022-02-11 | Disposition: A | Discharge status: Home / Self Care | Payer: Medicare Other | Source: Ambulatory Visit | Attending: Radiation Oncology | Admitting: Radiation Oncology

## 2022-02-11 ENCOUNTER — Other Ambulatory Visit: Payer: Self-pay

## 2022-02-11 DIAGNOSIS — C50411 Malignant neoplasm of upper-outer quadrant of right female breast: Secondary | ICD-10-CM | POA: Diagnosis not present

## 2022-02-11 LAB — RAD ONC ARIA SESSION SUMMARY
Course Elapsed Days: 14
Plan Fractions Treated to Date: 11
Plan Prescribed Dose Per Fraction: 2.67 Gy
Plan Total Fractions Prescribed: 15
Plan Total Prescribed Dose: 40.05 Gy
Reference Point Dosage Given to Date: 29.37 Gy
Reference Point Session Dosage Given: 2.67 Gy
Session Number: 11

## 2022-02-12 ENCOUNTER — Ambulatory Visit
Admission: RE | Admit: 2022-02-12 | Discharge: 2022-02-12 | Disposition: A | Discharge status: Home / Self Care | Payer: Medicare Other | Source: Ambulatory Visit | Attending: Radiation Oncology | Admitting: Radiation Oncology

## 2022-02-12 ENCOUNTER — Other Ambulatory Visit: Payer: Self-pay

## 2022-02-12 DIAGNOSIS — C50411 Malignant neoplasm of upper-outer quadrant of right female breast: Secondary | ICD-10-CM | POA: Diagnosis not present

## 2022-02-12 LAB — RAD ONC ARIA SESSION SUMMARY
Course Elapsed Days: 15
Plan Fractions Treated to Date: 12
Plan Prescribed Dose Per Fraction: 2.67 Gy
Plan Total Fractions Prescribed: 15
Plan Total Prescribed Dose: 40.05 Gy
Reference Point Dosage Given to Date: 32.04 Gy
Reference Point Session Dosage Given: 2.67 Gy
Session Number: 12

## 2022-02-13 ENCOUNTER — Ambulatory Visit
Admission: RE | Admit: 2022-02-13 | Discharge: 2022-02-13 | Disposition: A | Discharge status: Home / Self Care | Payer: Medicare Other | Source: Ambulatory Visit | Attending: Radiation Oncology | Admitting: Radiation Oncology

## 2022-02-13 ENCOUNTER — Other Ambulatory Visit: Payer: Self-pay

## 2022-02-13 DIAGNOSIS — C50411 Malignant neoplasm of upper-outer quadrant of right female breast: Secondary | ICD-10-CM | POA: Diagnosis not present

## 2022-02-13 LAB — RAD ONC ARIA SESSION SUMMARY
Course Elapsed Days: 16
Plan Fractions Treated to Date: 13
Plan Prescribed Dose Per Fraction: 2.67 Gy
Plan Total Fractions Prescribed: 15
Plan Total Prescribed Dose: 40.05 Gy
Reference Point Dosage Given to Date: 34.71 Gy
Reference Point Session Dosage Given: 2.67 Gy
Session Number: 13

## 2022-02-16 ENCOUNTER — Ambulatory Visit: Payer: Medicare Other | Admitting: Radiation Oncology

## 2022-02-16 ENCOUNTER — Other Ambulatory Visit: Payer: Self-pay

## 2022-02-16 ENCOUNTER — Ambulatory Visit
Admission: RE | Admit: 2022-02-16 | Discharge: 2022-02-16 | Disposition: A | Discharge status: Home / Self Care | Payer: Medicare Other | Source: Ambulatory Visit | Attending: Radiation Oncology | Admitting: Radiation Oncology

## 2022-02-16 ENCOUNTER — Ambulatory Visit: Payer: Medicare Other

## 2022-02-16 DIAGNOSIS — C50411 Malignant neoplasm of upper-outer quadrant of right female breast: Secondary | ICD-10-CM | POA: Diagnosis not present

## 2022-02-16 LAB — RAD ONC ARIA SESSION SUMMARY
Course Elapsed Days: 19
Plan Fractions Treated to Date: 14
Plan Prescribed Dose Per Fraction: 2.67 Gy
Plan Total Fractions Prescribed: 15
Plan Total Prescribed Dose: 40.05 Gy
Reference Point Dosage Given to Date: 37.38 Gy
Reference Point Session Dosage Given: 2.67 Gy
Session Number: 14

## 2022-02-17 ENCOUNTER — Other Ambulatory Visit: Payer: Self-pay

## 2022-02-17 ENCOUNTER — Encounter: Payer: Self-pay | Admitting: *Deleted

## 2022-02-17 ENCOUNTER — Ambulatory Visit
Admission: RE | Admit: 2022-02-17 | Discharge: 2022-02-17 | Disposition: A | Discharge status: Home / Self Care | Payer: Medicare Other | Source: Ambulatory Visit | Attending: Radiation Oncology | Admitting: Radiation Oncology

## 2022-02-17 DIAGNOSIS — C50411 Malignant neoplasm of upper-outer quadrant of right female breast: Secondary | ICD-10-CM

## 2022-02-17 LAB — RAD ONC ARIA SESSION SUMMARY
Course Elapsed Days: 20
Plan Fractions Treated to Date: 15
Plan Prescribed Dose Per Fraction: 2.67 Gy
Plan Total Fractions Prescribed: 15
Plan Total Prescribed Dose: 40.05 Gy
Reference Point Dosage Given to Date: 40.05 Gy
Reference Point Session Dosage Given: 2.67 Gy
Session Number: 15

## 2022-02-18 ENCOUNTER — Ambulatory Visit
Admission: RE | Admit: 2022-02-18 | Discharge: 2022-02-18 | Disposition: A | Discharge status: Home / Self Care | Payer: Medicare Other | Source: Ambulatory Visit | Attending: Radiation Oncology | Admitting: Radiation Oncology

## 2022-02-18 ENCOUNTER — Other Ambulatory Visit: Payer: Self-pay

## 2022-02-18 DIAGNOSIS — C50411 Malignant neoplasm of upper-outer quadrant of right female breast: Secondary | ICD-10-CM | POA: Diagnosis not present

## 2022-02-18 LAB — RAD ONC ARIA SESSION SUMMARY
Course Elapsed Days: 21
Plan Fractions Treated to Date: 1
Plan Prescribed Dose Per Fraction: 2 Gy
Plan Total Fractions Prescribed: 5
Plan Total Prescribed Dose: 10 Gy
Reference Point Dosage Given to Date: 42.05 Gy
Reference Point Session Dosage Given: 2 Gy
Session Number: 16

## 2022-02-19 ENCOUNTER — Other Ambulatory Visit: Payer: Self-pay

## 2022-02-19 ENCOUNTER — Ambulatory Visit
Admission: RE | Admit: 2022-02-19 | Discharge: 2022-02-19 | Disposition: A | Discharge status: Home / Self Care | Payer: Medicare Other | Source: Ambulatory Visit | Attending: Radiation Oncology | Admitting: Radiation Oncology

## 2022-02-19 DIAGNOSIS — C50411 Malignant neoplasm of upper-outer quadrant of right female breast: Secondary | ICD-10-CM | POA: Diagnosis not present

## 2022-02-19 LAB — RAD ONC ARIA SESSION SUMMARY
Course Elapsed Days: 22
Plan Fractions Treated to Date: 2
Plan Prescribed Dose Per Fraction: 2 Gy
Plan Total Fractions Prescribed: 5
Plan Total Prescribed Dose: 10 Gy
Reference Point Dosage Given to Date: 44.05 Gy
Reference Point Session Dosage Given: 2 Gy
Session Number: 17

## 2022-02-20 ENCOUNTER — Ambulatory Visit
Admission: RE | Admit: 2022-02-20 | Discharge: 2022-02-20 | Disposition: A | Discharge status: Home / Self Care | Payer: Medicare Other | Source: Ambulatory Visit | Attending: Radiation Oncology | Admitting: Radiation Oncology

## 2022-02-20 ENCOUNTER — Other Ambulatory Visit: Payer: Self-pay

## 2022-02-20 DIAGNOSIS — C50411 Malignant neoplasm of upper-outer quadrant of right female breast: Secondary | ICD-10-CM | POA: Diagnosis not present

## 2022-02-20 LAB — RAD ONC ARIA SESSION SUMMARY
Course Elapsed Days: 23
Plan Fractions Treated to Date: 3
Plan Prescribed Dose Per Fraction: 2 Gy
Plan Total Fractions Prescribed: 5
Plan Total Prescribed Dose: 10 Gy
Reference Point Dosage Given to Date: 46.05 Gy
Reference Point Session Dosage Given: 2 Gy
Session Number: 18

## 2022-02-23 ENCOUNTER — Other Ambulatory Visit: Payer: Self-pay

## 2022-02-23 ENCOUNTER — Ambulatory Visit
Admission: RE | Admit: 2022-02-23 | Discharge: 2022-02-23 | Disposition: A | Discharge status: Home / Self Care | Payer: Medicare Other | Source: Ambulatory Visit | Attending: Radiation Oncology | Admitting: Radiation Oncology

## 2022-02-23 DIAGNOSIS — C50411 Malignant neoplasm of upper-outer quadrant of right female breast: Secondary | ICD-10-CM | POA: Diagnosis not present

## 2022-02-23 LAB — RAD ONC ARIA SESSION SUMMARY
Course Elapsed Days: 26
Plan Fractions Treated to Date: 4
Plan Prescribed Dose Per Fraction: 2 Gy
Plan Total Fractions Prescribed: 5
Plan Total Prescribed Dose: 10 Gy
Reference Point Dosage Given to Date: 48.05 Gy
Reference Point Session Dosage Given: 2 Gy
Session Number: 19

## 2022-02-23 MED ORDER — RADIAPLEXRX EX GEL: Freq: Once | CUTANEOUS | Status: AC

## 2022-02-24 ENCOUNTER — Encounter: Payer: Self-pay | Admitting: Radiation Oncology

## 2022-02-24 ENCOUNTER — Ambulatory Visit
Admission: RE | Admit: 2022-02-24 | Discharge: 2022-02-24 | Disposition: A | Discharge status: Home / Self Care | Payer: Medicare Other | Source: Ambulatory Visit | Attending: Radiation Oncology | Admitting: Radiation Oncology

## 2022-02-24 ENCOUNTER — Other Ambulatory Visit: Payer: Self-pay

## 2022-02-24 DIAGNOSIS — C50411 Malignant neoplasm of upper-outer quadrant of right female breast: Secondary | ICD-10-CM | POA: Diagnosis not present

## 2022-02-24 LAB — RAD ONC ARIA SESSION SUMMARY
Course Elapsed Days: 27
Plan Fractions Treated to Date: 5
Plan Prescribed Dose Per Fraction: 2 Gy
Plan Total Fractions Prescribed: 5
Plan Total Prescribed Dose: 10 Gy
Reference Point Dosage Given to Date: 50.05 Gy
Reference Point Session Dosage Given: 2 Gy
Session Number: 20

## 2022-03-10 ENCOUNTER — Encounter: Payer: Self-pay | Admitting: Hematology and Oncology

## 2022-03-10 ENCOUNTER — Inpatient Hospital Stay: Payer: Medicare Other | Attending: Hematology and Oncology | Admitting: Hematology and Oncology

## 2022-03-10 ENCOUNTER — Other Ambulatory Visit: Payer: Self-pay

## 2022-03-10 DIAGNOSIS — Z170 Estrogen receptor positive status [ER+]: Secondary | ICD-10-CM | POA: Diagnosis not present

## 2022-03-10 DIAGNOSIS — C50411 Malignant neoplasm of upper-outer quadrant of right female breast: Secondary | ICD-10-CM | POA: Insufficient documentation

## 2022-03-10 DIAGNOSIS — Z86000 Personal history of in-situ neoplasm of breast: Secondary | ICD-10-CM | POA: Insufficient documentation

## 2022-03-10 DIAGNOSIS — Z923 Personal history of irradiation: Secondary | ICD-10-CM | POA: Diagnosis not present

## 2022-03-10 DIAGNOSIS — Z79899 Other long term (current) drug therapy: Secondary | ICD-10-CM | POA: Insufficient documentation

## 2022-03-10 MED ORDER — ANASTROZOLE 1 MG PO TABS: 1.0000 mg | ORAL_TABLET | Freq: Every day | ORAL | 3 refills | Status: DC

## 2022-03-10 NOTE — Progress Notes (Signed)
[facility] NOTE  Patient Care Team: Leonard Downing, MD as PCP - General (Family Medicine) Coralie Keens, MD as Consulting Physician (General Surgery) Benay Pike, MD as Consulting Physician (Hematology and Oncology) Eppie Gibson, MD as Attending Physician (Radiation Oncology) Mauro Kaufmann, RN as Oncology Nurse Navigator Rockwell Germany, RN as Oncology Nurse Navigator  CHIEF COMPLAINTS/PURPOSE OF CONSULTATION:  Newly diagnosed breast cancer  HISTORY OF PRESENTING ILLNESS:  Claire Bernard 71 y.o. female is here because of recent diagnosis of right breast cancer  I reviewed her records extensively and collaborated the history with the patient.  SUMMARY OF ONCOLOGIC HISTORY: Oncology History  Malignant neoplasm of upper-outer quadrant of right breast in female, estrogen receptor positive ([place])  10/10/2021 Imaging   Screening mammogram showed a possible mass in the right breast.  Diagnostic mammogram confirmed an irregular isoechoic mass in the outer breast at the middle depth measuring just over 1 cm associated with architectural distortion.  No associated suspicious calcifications.  Targeted ultrasound was performed, demonstrating an irregular hypoechoic mass with vague margins at the 9 o'clock position 4 cm from nipple at middle depth measuring approximately 1.1 x 0.9 x 1 cm demonstrating posterior acoustic shadowing and no internal power Doppler flow corresponding to the screening mammographic finding.  Right axilla demonstrated no pathologic lymphadenopathy. Ultrasound of the area showed highly suspicious 1.1 cm mass involving the outer right breast at 9 o'clock position 4 cm from the nipple.  No pathologic right axillary lymphadenopathy.    11/18/2021 Pathology Results   Right breast needle core biopsy 9 o'clock position showed invasive carcinoma of no special type, grade 2, 11 mm in greatest length, ER 100% positive strong staining  intensity, PR 100% positive strong staining intensity, Ki-67 of 40% and HER2 negative by IHC.   11/24/2021 Initial Diagnosis   Malignant neoplasm of upper-outer quadrant of right breast in female, estrogen receptor positive ([place])    Genetic Testing   [facility] CancerNext-Expanded Panel was Negative. 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 Report date is 12/15/2021.  The CancerNext-Expanded gene panel offered by [facility] and includes sequencing, rearrangement, and RNA analysis for the following 77 genes: AIP, ALK, APC, ATM, AXIN2, BAP1, BARD1, BLM, BMPR1A, BRCA1, BRCA2, BRIP1, CDC73, CDH1, CDK4, CDKN1B, CDKN2A, CHEK2, CTNNA1, DICER1, FANCC, FH, FLCN, GALNT12, KIF1B, LZTR1, MAX, MEN1, MET, MLH1, MSH2, MSH3, MSH6, MUTYH, NBN, NF1, NF2, NTHL1, PALB2, PHOX2B, PMS2, POT1, PRKAR1A, PTCH1, PTEN, RAD51C, RAD51D, RB1, RECQL, RET, SDHA, SDHAF2, SDHB, SDHC, SDHD, SMAD4, SMARCA4, SMARCB1, SMARCE1, STK11, SUFU, TMEM127, TP53, TSC1, TSC2, VHL and XRCC2 (sequencing and deletion/duplication); EGFR, EGLN1, HOXB13, KIT, MITF, PDGFRA, POLD1, and POLE (sequencing only); EPCAM and GREM1 (deletion/duplication only).    12/15/2021 Surgery   She had right breast lumpectomy with sentinel lymph node biopsy.  Pathology showed invasive ductal carcinoma, 1.7 cm, grade 3 along with high-grade DCIS, resection margins are negative for carcinoma sentinel lymph node negative.  Previous prognostic showed ER 100% positive strong staining, PR 100% positive strong staining, KI however at 40%, HER2 negative.    12/29/2021 Oncotype testing   Oncotype at 15, distant recurrence risk at 9 years of 4% and absolute chemotherapy benefit less than 1%.    She has past medical history of DCIS of the left breast treated in 2005 with lumpectomy, radiation and then went on 5 years of antiestrogen therapy likely anastrozole.    Interval history  Since last visit, she completed adjuvant radiation  on 6/27 and  is here to initiate antiestrogen therapy.   Since last visit, she hurt her ankle, may have sprained it, has a follow up with PCP to follow up on this. In the past when she had DCIS, she tolerated 5 years of anastrozole very well. She is willing to try this again. No other complaints. She said she was scheduled for a bone density today but she canceled it because her doctor did not think she needed it.  She is also worried about the co-pay. Rest of the pertinent 10 point ROS reviewed and negative.  MEDICAL HISTORY:  Past Medical History:  Diagnosis Date   Cancer ([place]) 07/2004   breast cancer   Chest pressure    Clotting disorder ([place]) 1970's   from birthcontrol    Esophageal spasm    Fatigue    GERD (gastroesophageal reflux disease)    History of dizziness    Hyperlipidemia    Hypertension    Laryngitis    Low bone mass    Personal history of radiation therapy 07/2004   left breast   Pre-diabetes     SURGICAL HISTORY: Past Surgical History:  Procedure Laterality Date   ANKLE FRACTURE SURGERY     BREAST LUMPECTOMY  07/17/2004   left breast   BREAST LUMPECTOMY WITH RADIOACTIVE SEED AND SENTINEL LYMPH NODE BIOPSY Right 12/15/2021   Procedure: RIGHT BREAST LUMPECTOMY WITH RADIOACTIVE SEED AND SENTINEL LYMPH NODE BIOPSY;  Surgeon: Coralie Keens, MD;  Location: [facility];  Service: General;  Laterality: Right;   cataract surg     KNEE SURGERY     Arthroscopic   SHOULDER SURGERY     VAGINAL HYSTERECTOMY      SOCIAL HISTORY: Social History   Socioeconomic History   Marital status: Married    Spouse name: Not on file   Number of children: 2   Years of education: Not on file   Highest education level: Not on file  Occupational History    Employer: [id number]  Tobacco Use   Smoking status: Former    Packs/day: 0.50    Years: 20.00    Total pack years: 10.00    Types: Cigarettes    Quit date: 08/31/1990    Years since quitting: 31.5   Smokeless tobacco:  Never  Vaping Use   Vaping Use: Never used  Substance and Sexual Activity   Alcohol use: Yes    Alcohol/week: 0.0 standard drinks of alcohol    Comment: Rare   Drug use: No   Sexual activity: Yes    Birth control/protection: Surgical, Post-menopausal    Comment: 1st intercourse 71 yo-Fewer than 5 partners  Other Topics Concern   Not on file  Social History Narrative   Not on file   Social Determinants of Health   Financial Resource Strain: Low Risk  (11/26/2021)   Overall Financial Resource Strain (CARDIA)    Difficulty of Paying Living Expenses: Not hard at all  Food Insecurity: No Food Insecurity (11/26/2021)   Hunger Vital Sign    Worried About Running Out of Food in the Last Year: Never true    [place] in the Last Year: Never true  Transportation Needs: No Transportation Needs (11/26/2021)   PRAPARE - Hydrologist (Medical): No    Lack of Transportation (Non-Medical): No  Physical Activity: Not on file  Stress: Not on file  Social Connections: Not on file  Intimate Partner Violence: Not on file  FAMILY HISTORY: Family History  Problem Relation Age of Onset   Atrial fibrillation Father    Emphysema Father    Heart attack Father        multiple   Heart disease Father    Hypertension Sister    Stroke Sister        x6 strokes is 71 y/o   Asthma Sister    Diabetes Sister    Depression Brother    Heart disease Brother    Coronary artery disease Brother        x3 CABG   Atrial fibrillation Maternal Grandmother    Diabetes Maternal Grandmother    Heart disease Maternal Grandmother    Hypertension Maternal Grandmother    Atrial fibrillation Maternal Grandfather    Heart disease Maternal Grandfather    Hypertension Maternal Grandfather    Breast cancer Paternal Grandmother        dx. 52s   Cancer Paternal Grandmother        uterine or ovarian, unsure if breast cancer metastasis or second primary   Heart disease Paternal  Grandfather    Hypertension Paternal Grandfather    Atrial fibrillation Paternal Grandfather    Breast cancer Paternal Aunt        dx. 65s   Colon cancer Paternal Aunt    Colon cancer Paternal Uncle        dx. 58s   Lung cancer Paternal Uncle     ALLERGIES:  is allergic to caine-1 [lidocaine hcl] and procaine hcl.  MEDICATIONS:  Current Outpatient Medications  Medication Sig Dispense Refill   anastrozole (ARIMIDEX) 1 MG tablet Take 1 tablet (1 mg total) by mouth daily. 90 tablet 3   busPIRone (BUSPAR) 5 MG tablet Take 5 mg by mouth 2 (two) times daily.     cetirizine (ZYRTEC) 10 MG tablet Take 10 mg by mouth daily.     estradiol (ESTRACE VAGINAL) 0.1 MG/GM vaginal cream INSERT ONE-HALF GRAM INTRAVAGINALLY TWO TIMES A WEEK AT BEDTIME 43 g 1   hydrochlorothiazide (HYDRODIURIL) 25 MG tablet Take 25 mg by mouth daily.     lisinopril (ZESTRIL) 20 MG tablet Take 20 mg by mouth daily.     naproxen (NAPROSYN) 500 MG tablet Take 500 mg by mouth 2 (two) times daily with a meal.     traZODone (DESYREL) 150 MG tablet as directed.      No current facility-administered medications for this visit.    REVIEW OF SYSTEMS:    Constitutional: Denies fevers, chills or abnormal night sweats Eyes: Denies blurriness of vision, double vision or watery eyes Ears, nose, mouth, throat, and face: Denies mucositis or sore throat Respiratory: Denies cough, dyspnea or wheezes Cardiovascular: Denies palpitation, chest discomfort or lower extremity swelling Gastrointestinal:  Denies nausea, heartburn or change in bowel habits Skin: Denies abnormal skin rashes Lymphatics: Denies new lymphadenopathy or easy bruising Neurological:Denies numbness, tingling or new weaknesses Behavioral/Psych: Mood is stable, no new changes  Breast: Denies any palpable lumps or discharge All other systems were reviewed with the patient and are negative.  PHYSICAL EXAMINATION: ECOG PERFORMANCE STATUS: 0 - Asymptomatic  Vitals:    03/10/22 1141  BP: 123/74  Pulse: 83  Resp: 16  Temp: 98.1 F (36.7 C)  SpO2: 97%    Filed Weights   03/10/22 1141  Weight: 194 lb (88 kg)    Physical Exam Constitutional:      Appearance: Normal appearance.  Chest:     Comments: Bilateral breasts inspected.  Postsurgical changes  noted.  No other new concerns.  No regional adenopathy Musculoskeletal:        General: No swelling.     Cervical back: Normal range of motion and neck supple. No rigidity.     Comments: Left leg in compression bandage, may have recently sprained the left ankle  Lymphadenopathy:     Cervical: No cervical adenopathy.  Neurological:     Mental Status: She is alert.       LABORATORY DATA:  I have reviewed the data as listed Lab Results  Component Value Date   WBC 8.6 11/26/2021   HGB 13.2 11/26/2021   HCT 37.2 11/26/2021   MCV 88.8 11/26/2021   PLT 252 11/26/2021   Lab Results  Component Value Date   NA 138 11/26/2021   K 3.5 11/26/2021   CL 105 11/26/2021   CO2 26 11/26/2021    RADIOGRAPHIC STUDIES: I have personally reviewed the radiological reports and agreed with the findings in the report.  ASSESSMENT AND PLAN:  Malignant neoplasm of upper-outer quadrant of right breast in female, estrogen receptor positive ([place]) This is a very pleasant 71 year old female patient with newly diagnosed right breast invasive ductal carcinoma, grade 2, 11 mm on ultrasound, prognostics ER 100% positive strong staining, PR 100% positive strong staining, Ki-67 of 40% and HER2 negative by IHC (0).  Given small tumor, strong ER/PR positivity, we have discussed about upfront surgery followed by Oncotype testing.  Oncotype score resulted at 15, distant risk of recurrence at 9 years is about 4% with AI or tamoxifen and absolute chemotherapy benefit less than 1% in this group.  No role for adjuvant chemotherapy.  She is now status post adjuvant radiation and will start on anastrozole.  Last bone density  2021, T score of -1.6, she was scheduled to have a bone density today, but cancelled it since she says her PCP was not board on it for repeat bone density yet.  We have explained that aromatase inhibitors do accelerate bone loss, hence we recommend repeating bone density every 2 yrs. she will try to reschedule it after talking to her gynecologist and her PCP. In the interim, we recommend calcium, vit D supplements and weight bearing exercises.  Return to clinic in 6 months with me. Mammogram due in February 2024   I spent 30 minutes in the care of this patient including but not limited to history, physical, review of records, counseling and coordination of care. All questions were answered. The patient knows to call the clinic with any problems, questions or concerns.    Benay Pike, MD 03/10/22

## 2022-03-10 NOTE — Assessment & Plan Note (Addendum)
This is a very pleasant 71 year old female patient with newly diagnosed right breast invasive ductal carcinoma, grade 2, 11 mm on ultrasound, prognostics ER 100% positive strong staining, PR 100% positive strong staining, Ki-67 of 40% and HER2 negative by IHC (0).  Given small tumor, strong ER/PR positivity, we have discussed about upfront surgery followed by Oncotype testing.  Oncotype score resulted at 15, distant risk of recurrence at 9 years is about 4% with AI or tamoxifen and absolute chemotherapy benefit less than 1% in this group.  No role for adjuvant chemotherapy.  She is now status post adjuvant radiation and will start on anastrozole.  Last bone density 2021, T score of -1.6, she was scheduled to have a bone density today, but cancelled it since she says her PCP was not board on it for repeat bone density yet.  We have explained that aromatase inhibitors do accelerate bone loss, hence we recommend repeating bone density every 2 yrs. she will try to reschedule it after talking to her gynecologist and her PCP. In the interim, we recommend calcium, vit D supplements and weight bearing exercises.  Return to clinic in 6 months with me. Mammogram due in February 2024

## 2022-03-16 ENCOUNTER — Ambulatory Visit: Payer: Medicare Other | Attending: Surgery

## 2022-03-16 VITALS — Wt 194.2 lb

## 2022-03-16 DIAGNOSIS — Z483 Aftercare following surgery for neoplasm: Secondary | ICD-10-CM | POA: Insufficient documentation

## 2022-03-16 NOTE — Therapy (Addendum)
OUTPATIENT PHYSICAL THERAPY SOZO SCREENING NOTE   Patient Name: Claire Bernard MRN: [id number] DOB:Dec 16, 1950, 71 y.o., female Today's Date: 03/16/2022  PCP: Leonard Downing, MD REFERRING PROVIDER: Leonard Downing, *   PT End of Session - 03/16/22 1222     Visit Number 7   # unchanged due to screen only   PT Start Time 1219    PT Stop Time 1226    PT Time Calculation (min) 7 min    Activity Tolerance Patient tolerated treatment well    Behavior During Therapy [facility] for tasks assessed/performed             Past Medical History:  Diagnosis Date   Cancer ([place]) 07/2004   breast cancer   Chest pressure    Clotting disorder ([place]) 1970's   from birthcontrol    Esophageal spasm    Fatigue    GERD (gastroesophageal reflux disease)    History of dizziness    Hyperlipidemia    Hypertension    Laryngitis    Low bone mass    Personal history of radiation therapy 07/2004   left breast   Pre-diabetes    Past Surgical History:  Procedure Laterality Date   ANKLE FRACTURE SURGERY     BREAST LUMPECTOMY  07/17/2004   left breast   BREAST LUMPECTOMY WITH RADIOACTIVE SEED AND SENTINEL LYMPH NODE BIOPSY Right 12/15/2021   Procedure: RIGHT BREAST LUMPECTOMY WITH RADIOACTIVE SEED AND SENTINEL LYMPH NODE BIOPSY;  Surgeon: Coralie Keens, MD;  Location: [place];  Service: General;  Laterality: Right;   cataract surg     KNEE SURGERY     Arthroscopic   SHOULDER SURGERY     VAGINAL HYSTERECTOMY     Patient Active Problem List   Diagnosis Date Noted   Vaginal atrophy 12/08/2021   History of osteopenia 12/08/2021   Genetic testing 12/04/2021   Malignant neoplasm of upper-outer quadrant of right breast in female, estrogen receptor positive ([place]) 11/24/2021   History of ductal carcinoma in situ (DCIS) of breast 10/04/2019   Low bone mass    Hyperlipidemia    Hypertension    Fatigue    Chest pressure    History of dizziness    Esophageal spasm     GERD (gastroesophageal reflux disease)    Laryngitis     REFERRING DIAG: right breast cancer at risk for lymphedema  THERAPY DIAG:  Aftercare following surgery for neoplasm  PERTINENT HISTORY: Patient was diagnosed on 10/30/2021 with right grade II invasive ductal carcinoma breast cancer. She underwent a right lumpectomy and sentinel node biopsy (1 negative node) on 12/15/2021 It is ER/PR positive and HER2 negative with a Ki67 of 40%. She had left breast cancer in 2005 which was treated with surgery, radiation, and anti-estrogen therapy. No axillary nodes removed per her report.  PRECAUTIONS: right UE Lymphedema risk, None  SUBJECTIVE: Pt returns for her 3 month L-Dex screen.   PAIN:  Are you having pain? No  SOZO SCREENING: Patient was assessed today using the SOZO machine to determine the lymphedema index score. This was compared to her baseline score. It was determined that she is within the recommended range when compared to her baseline and no further action is needed at this time. She will continue SOZO screenings. These are done every 3 months for 2 years post operatively followed by every 6 months for 2 years, and then annually.   L-DEX FLOWSHEETS - 03/16/22 1200       L-DEX  LYMPHEDEMA SCREENING   Measurement Type Unilateral    L-DEX MEASUREMENT EXTREMITY Upper Extremity    POSITION  Standing    DOMINANT SIDE Right    At Risk Side Right    BASELINE SCORE (UNILATERAL) -5.4    L-DEX SCORE (UNILATERAL) 0.1    VALUE CHANGE (UNILAT) 5.5              Otelia Limes, PTA 03/16/2022, 12:27 PM

## 2022-03-27 ENCOUNTER — Telehealth: Payer: Self-pay

## 2022-03-27 ENCOUNTER — Ambulatory Visit: Payer: Medicare Other | Admitting: Radiation Oncology

## 2022-03-27 NOTE — Telephone Encounter (Signed)
I called the patient today about her upcoming follow-up appointment in radiation oncology.   Given the state of the COVID-19 pandemic, concerning case numbers in our community, and guidance from [facility], I offered a phone assessment with the patient to determine if coming to the clinic was necessary. She accepted.  She is still fatigued but reports it's improving incrementally. She unfortunately sustained a fall a couple of days after she finished radiation, and injured her left ankle. She reports her breast pain persists, but is decreasing in frequency. Specifically, she reports good healing of her skin in the radiation fields.  There is persistent hyperpigmentation, but she denies any peeling and reports skin is intact. She continues to apply radiaplex to the treatment field, and I recommended that she transition to oil or lotion with vitamin E once she finishes the radiaplex.    Continue follow-up with medical oncology. She saw Dr. Chryl Heck on 03/10/2022 and will F/U with her again in 6 months. She is tolerating her anastrozole, but does report her hot flashes have increased in intensity. I explained that yearly mammograms are important for patients with intact breast tissue, and physical exams are important after mastectomy for patients that cannot undergo mammography. She reports she is already scheduled for a mammogram in 10/2022.  I encouraged her to call if she had further questions or concerns about her healing. Otherwise, she will follow-up PRN in radiation oncology. Patient is pleased with this plan, and we will cancel her upcoming follow-up to reduce the risk of COVID-19 transmission.

## 2022-04-17 NOTE — Progress Notes (Signed)
                                                                                                                                                             Patient Name: Claire Bernard MRN: [id number] DOB: 19-Nov-1950 Referring Physician: Benay Pike Date of Service: 02/24/2022 [facility], [place]                                                        End Of Treatment Note  Diagnoses: C50.411-Malignant neoplasm of upper-outer quadrant of right female breast  Cancer Staging:  Cancer Staging  Malignant neoplasm of upper-outer quadrant of right breast in female, estrogen receptor positive ([place]) Staging form: Breast, AJCC 8th Edition - Clinical stage from 11/26/2021: Stage IA (cT1c, cN0, cM0, G2, ER+, PR+, HER2-) - Unsigned Stage prefix: Initial diagnosis Histologic grading system: 3 grade system Laterality: Right Staged by: Pathologist and managing physician Stage used in treatment planning: Yes National guidelines used in treatment planning: Yes Type of national guideline used in treatment planning: NCCN   Intent: Curative  Radiation Treatment Dates: 01/28/2022 through 02/24/2022 Site Technique Total Dose (Gy) Dose per Fx (Gy) Completed Fx Beam Energies  Breast, Right: Breast_R 3D 40.05/40.05 2.67 15/15 10X  Breast, Right: Breast_R_Bst 3D 10/10 2 5/5 6X   Narrative: The patient tolerated radiation therapy relatively well.   Plan: The patient will follow-up with radiation oncology in 3mo . -----------------------------------  Eppie Gibson, MD

## 2022-06-22 ENCOUNTER — Ambulatory Visit: Payer: Medicare Other

## 2022-07-13 ENCOUNTER — Other Ambulatory Visit: Payer: Self-pay

## 2022-07-13 ENCOUNTER — Encounter (HOSPITAL_BASED_OUTPATIENT_CLINIC_OR_DEPARTMENT_OTHER): Payer: Self-pay

## 2022-07-13 ENCOUNTER — Emergency Department (HOSPITAL_BASED_OUTPATIENT_CLINIC_OR_DEPARTMENT_OTHER)
Admission: EM | Admit: 2022-07-13 | Discharge: 2022-07-13 | Disposition: A | Discharge status: Home / Self Care | Payer: Medicare Other | Attending: Emergency Medicine | Admitting: Emergency Medicine

## 2022-07-13 ENCOUNTER — Emergency Department (HOSPITAL_BASED_OUTPATIENT_CLINIC_OR_DEPARTMENT_OTHER): Payer: Medicare Other

## 2022-07-13 ENCOUNTER — Emergency Department (HOSPITAL_BASED_OUTPATIENT_CLINIC_OR_DEPARTMENT_OTHER): Payer: Medicare Other | Admitting: Radiology

## 2022-07-13 ENCOUNTER — Ambulatory Visit: Payer: Medicare Other | Attending: Surgery

## 2022-07-13 VITALS — Wt 194.2 lb

## 2022-07-13 DIAGNOSIS — Z853 Personal history of malignant neoplasm of breast: Secondary | ICD-10-CM | POA: Insufficient documentation

## 2022-07-13 DIAGNOSIS — R0789 Other chest pain: Secondary | ICD-10-CM | POA: Diagnosis not present

## 2022-07-13 DIAGNOSIS — I10 Essential (primary) hypertension: Secondary | ICD-10-CM | POA: Diagnosis not present

## 2022-07-13 DIAGNOSIS — Z483 Aftercare following surgery for neoplasm: Secondary | ICD-10-CM | POA: Insufficient documentation

## 2022-07-13 DIAGNOSIS — E876 Hypokalemia: Secondary | ICD-10-CM | POA: Diagnosis not present

## 2022-07-13 DIAGNOSIS — R079 Chest pain, unspecified: Secondary | ICD-10-CM

## 2022-07-13 DIAGNOSIS — R791 Abnormal coagulation profile: Secondary | ICD-10-CM | POA: Diagnosis not present

## 2022-07-13 DIAGNOSIS — Z79899 Other long term (current) drug therapy: Secondary | ICD-10-CM | POA: Insufficient documentation

## 2022-07-13 LAB — BASIC METABOLIC PANEL
Anion gap: 11 (ref 5–15)
BUN: 20 mg/dL (ref 8–23)
CO2: 28 mmol/L (ref 22–32)
Calcium: 9.7 mg/dL (ref 8.9–10.3)
Chloride: 101 mmol/L (ref 98–111)
Creatinine, Ser: 0.76 mg/dL (ref 0.44–1.00)
GFR, Estimated: >60 mL/min (ref >60)
Glucose, Bld: 93 mg/dL (ref 70–99)
Potassium: 3.3 mmol/L — ABNORMAL LOW (ref 3.5–5.1)
Sodium: 140 mmol/L (ref 135–145)

## 2022-07-13 LAB — CBC
HCT: 37.4 % (ref 36.0–46.0)
Hemoglobin: 13.2 g/dL (ref 12.0–15.0)
MCH: 31.7 pg (ref 26.0–34.0)
MCHC: 35.3 g/dL (ref 30.0–36.0)
MCV: 89.7 fL (ref 80.0–100.0)
Platelets: 268 10*3/uL (ref 150–400)
RBC: 4.17 MIL/uL (ref 3.87–5.11)
RDW: 13.1 % (ref 11.5–15.5)
WBC: 7.9 10*3/uL (ref 4.0–10.5)
nRBC: 0 % (ref 0.0–0.2)

## 2022-07-13 LAB — D-DIMER, QUANTITATIVE: D-Dimer, Quant: 0.72 ug/mL-FEU — ABNORMAL HIGH (ref 0.00–0.50)

## 2022-07-13 LAB — TROPONIN I (HIGH SENSITIVITY)
Troponin I (High Sensitivity): 3 ng/L (ref <18)
Troponin I (High Sensitivity): 5 ng/L (ref <18)

## 2022-07-13 MED ORDER — POTASSIUM CHLORIDE CRYS ER 20 MEQ PO TBCR
40.0000 meq | EXTENDED_RELEASE_TABLET | Freq: Once | ORAL | Status: AC
  Administered 2022-07-13: 40 meq via ORAL
  Filled 2022-07-13: qty 2

## 2022-07-13 MED ORDER — IOHEXOL 350 MG/ML SOLN
100.0000 mL | Freq: Once | INTRAVENOUS | Status: AC | PRN
  Administered 2022-07-13: 60 mL via INTRAVENOUS

## 2022-07-13 NOTE — Therapy (Addendum)
OUTPATIENT PHYSICAL THERAPY SOZO SCREENING NOTE   Patient Name: Claire Bernard MRN: [id number] DOB:[birth year], 71 y.o., female Today's Date: 07/13/2022  PCP: Leonard Downing, MD REFERRING PROVIDER: Coralie Keens, MD   PT End of Session - 07/13/22 1505     Visit Number 7   # unchanged due to screen only   PT Start Time 1502    PT Stop Time 1510    PT Time Calculation (min) 8 min    Activity Tolerance Patient tolerated treatment well    Behavior During Therapy [facility] for tasks assessed/performed             Past Medical History:  Diagnosis Date   Cancer ([place]) 07/2004   breast cancer   Chest pressure    Clotting disorder ([place]) 1970's   from birthcontrol    Esophageal spasm    Fatigue    GERD (gastroesophageal reflux disease)    History of dizziness    Hyperlipidemia    Hypertension    Laryngitis    Low bone mass    Personal history of radiation therapy 07/2004   left breast   Pre-diabetes    Past Surgical History:  Procedure Laterality Date   ANKLE FRACTURE SURGERY     BREAST LUMPECTOMY  07/17/2004   left breast   BREAST LUMPECTOMY WITH RADIOACTIVE SEED AND SENTINEL LYMPH NODE BIOPSY Right 12/15/2021   Procedure: RIGHT BREAST LUMPECTOMY WITH RADIOACTIVE SEED AND SENTINEL LYMPH NODE BIOPSY;  Surgeon: Coralie Keens, MD;  Location: [place];  Service: General;  Laterality: Right;   cataract surg     KNEE SURGERY     Arthroscopic   SHOULDER SURGERY     VAGINAL HYSTERECTOMY     Patient Active Problem List   Diagnosis Date Noted   Vaginal atrophy 12/08/2021   History of osteopenia 12/08/2021   Genetic testing 12/04/2021   Malignant neoplasm of upper-outer quadrant of right breast in female, estrogen receptor positive ([place]) 11/24/2021   History of ductal carcinoma in situ (DCIS) of breast 10/04/2019   Low bone mass    Hyperlipidemia    Hypertension    Fatigue    Chest pressure    History of dizziness    Esophageal spasm     GERD (gastroesophageal reflux disease)    Laryngitis     REFERRING DIAG: right breast cancer at risk for lymphedema  THERAPY DIAG: Aftercare following surgery for neoplasm  PERTINENT HISTORY: Patient was diagnosed on 10/30/2021 with right grade II invasive ductal carcinoma breast cancer. She underwent a right lumpectomy and sentinel node biopsy (1 negative node) on 12/15/2021 It is ER/PR positive and HER2 negative with a Ki67 of 40%. She had left breast cancer in 2005 which was treated with surgery, radiation, and anti-estrogen therapy. No axillary nodes removed per her report.  PRECAUTIONS: right UE Lymphedema risk, None  SUBJECTIVE: Pt returns for her 3 month L-Dex screen.  "I just started having some chest tightness today that's about a 5/10 pain between my sternum and Lt breast. It's different than my normal esophageal pain I get sometimes."   PAIN:  Are you having pain? No  SOZO SCREENING: Patient was assessed today using the SOZO machine to determine the lymphedema index score. This was compared to her baseline score. It was determined that she is within the recommended range when compared to her baseline and no further action is needed at this time. She will continue SOZO screenings. These are done every 3 months for 2  years post operatively followed by every 6 months for 2 years, and then annually.  Advised pt to call her PCP or oncologist to update them about new chest pain she started experiencing today. She verbalized understanding.    L-DEX FLOWSHEETS - 07/13/22 1500       L-DEX LYMPHEDEMA SCREENING   Measurement Type Unilateral    L-DEX MEASUREMENT EXTREMITY Upper Extremity    POSITION  Standing    DOMINANT SIDE Right    At Risk Side Right    BASELINE SCORE (UNILATERAL) -5.4    L-DEX SCORE (UNILATERAL) -4.3    VALUE CHANGE (UNILAT) 1.1              Otelia Limes, PTA 07/13/2022, 3:10 PM

## 2022-07-13 NOTE — ED Triage Notes (Signed)
Pt reports substernal CP radiating to L chest since 1045 this morning.

## 2022-07-13 NOTE — Discharge Instructions (Signed)
Please return to the ED with any new or worsening signs or symptoms Please follow-up with your PCP for further management Please read attached guide concerning nonspecific chest pain

## 2022-07-13 NOTE — ED Provider Notes (Signed)
[facility] EMERGENCY DEPT Provider Note   CSN: [id number] Arrival date & time: 07/13/22  1530     History  Chief Complaint  Patient presents with   Chest Pain    Claire Bernard is a 71 y.o. female with medical history of breast cancer, GERD, hypertension.  Patient presents to ED for evaluation of left-sided chest pain.  Patient reports that she chronically has right-sided chest pain which she attributes to her lumpectomy on the right side.  The patient reports that today, around 10:45 AM, she was getting ready to go have lunch with her friends when she had sudden onset of sharp chest pain on the left side of her chest.  Patient denies radiation of this chest pain.  Patient denies any shortness of breath at this pain.  The patient reports that the pain lasted for about 15 minutes and then dissipated.  Patient reports that the pain then returned again around 2 PM when she was at physical therapy.  Patient denies any fevers, nausea, vomiting, abdominal pain or back pain.   Chest Pain Associated symptoms: no abdominal pain, no back pain, no fever, no nausea, no shortness of breath and no vomiting        Home Medications Prior to Admission medications   Medication Sig Start Date End Date Taking? Authorizing Provider  anastrozole (ARIMIDEX) 1 MG tablet Take 1 tablet (1 mg total) by mouth daily. 03/10/22   Benay Pike, MD  busPIRone (BUSPAR) 5 MG tablet Take 5 mg by mouth 2 (two) times daily.    [provider]  cetirizine (ZYRTEC) 10 MG tablet Take 10 mg by mouth daily.    [provider]  estradiol (ESTRACE VAGINAL) 0.1 MG/GM vaginal cream INSERT ONE-HALF GRAM INTRAVAGINALLY TWO TIMES A WEEK AT BEDTIME 12/12/21   Salvadore Dom, MD  hydrochlorothiazide (HYDRODIURIL) 25 MG tablet Take 25 mg by mouth daily.    [provider]  lisinopril (ZESTRIL) 20 MG tablet Take 20 mg by mouth daily.    [provider]  naproxen (NAPROSYN)  500 MG tablet Take 500 mg by mouth 2 (two) times daily with a meal.    [provider]  traZODone (DESYREL) 150 MG tablet as directed.     [provider]      Allergies    Caine-1 [lidocaine hcl] and Procaine hcl    Review of Systems   Review of Systems  Constitutional:  Negative for fever.  Respiratory:  Negative for shortness of breath.   Cardiovascular:  Positive for chest pain.  Gastrointestinal:  Negative for abdominal pain, nausea and vomiting.  Musculoskeletal:  Negative for back pain.  All other systems reviewed and are negative.   Physical Exam Updated Vital Signs BP 134/68 (BP Location: Right Arm)   Pulse 80   Temp 98.2 F (36.8 C)   Resp 16   Ht '5\' 5"'$  (1.651 m)   Wt 88 kg   SpO2 93%   BMI 32.28 kg/m  Physical Exam Vitals and nursing note reviewed.  Constitutional:      General: She is not in acute distress.    Appearance: Normal appearance. She is not ill-appearing, toxic-appearing or diaphoretic.  HENT:     Head: Normocephalic and atraumatic.     Nose: Nose normal. No congestion.     Mouth/Throat:     Mouth: Mucous membranes are moist.     Pharynx: Oropharynx is clear.  Eyes:     Extraocular Movements: Extraocular movements intact.  Conjunctiva/sclera: Conjunctivae normal.     Pupils: Pupils are equal, round, and reactive to light.  Cardiovascular:     Rate and Rhythm: Normal rate and regular rhythm.  Pulmonary:     Effort: Pulmonary effort is normal.     Breath sounds: Normal breath sounds. No wheezing.  Abdominal:     General: Abdomen is flat. Bowel sounds are normal.     Palpations: Abdomen is soft.     Tenderness: There is no abdominal tenderness.  Musculoskeletal:     Cervical back: Normal range of motion and neck supple. No tenderness.  Skin:    General: Skin is warm and dry.     Capillary Refill: Capillary refill takes less than 2 seconds.  Neurological:     Mental Status: She is alert and oriented to person, place,  and time.     ED Results / Procedures / Treatments   Labs (all labs ordered are listed, but only abnormal results are displayed) Labs Reviewed  BASIC METABOLIC PANEL - Abnormal; Notable for the following components:      Result Value   Potassium 3.3 (*)    All other components within normal limits  D-DIMER, QUANTITATIVE - Abnormal; Notable for the following components:   D-Dimer, Quant 0.72 (*)    All other components within normal limits  CBC  TROPONIN I (HIGH SENSITIVITY)  TROPONIN I (HIGH SENSITIVITY)    EKG EKG Interpretation  Date/Time:  Monday July 13 2022 15:35:53 EST Ventricular Rate:  81 PR Interval:  182 QRS Duration: 88 QT Interval:  356 QTC Calculation: 413 R Axis:   -11 Text Interpretation: Normal sinus rhythm Possible Anterior infarct , age undetermined  nonspecific changes unchanged sine April 2023 Confirmed by Sherwood Gambler [phone]) on 07/13/2022 7:25:53 PM  Radiology CT Angio Chest PE W and/or Wo Contrast  Result Date: 07/13/2022 CLINICAL DATA:  Pulmonary embolism (PE) suspected, low to intermediate prob, positive D-dimer. Chest pain EXAM: CT ANGIOGRAPHY CHEST WITH CONTRAST TECHNIQUE: Multidetector CT imaging of the chest was performed using the standard protocol during bolus administration of intravenous contrast. Multiplanar CT image reconstructions and MIPs were obtained to evaluate the vascular anatomy. RADIATION DOSE REDUCTION: This exam was performed according to the departmental dose-optimization program which includes automated exposure control, adjustment of the mA and/or kV according to patient size and/or use of iterative reconstruction technique. CONTRAST:  43m OMNIPAQUE IOHEXOL 350 MG/ML SOLN COMPARISON:  None Available. FINDINGS: Cardiovascular: Satisfactory opacification of the pulmonary arteries to the segmental level. No evidence of pulmonary embolism. Normal heart size. No pericardial effusion. Mediastinum/Nodes: 2.3 cm right thyroid nodule  noted, not well characterized on this examination. The esophagus is unremarkable. No pathologic thoracic adenopathy. Lungs/Pleura: Minimal left basilar scarring. Lungs are otherwise clear. No pleural effusion or pneumothorax. Upper Abdomen: No acute abnormality. Musculoskeletal: No chest wall abnormality. No acute or significant osseous findings. Review of the MIP images confirms the above findings. IMPRESSION: 1. No pulmonary embolism. 2. 2.3 cm incidental right thyroid nodule. Recommend non-emergent thyroid ultrasound. Reference: J Am Coll Radiol. 2015 Feb;12(2): 143-50 Electronically Signed   By: AFidela SalisburyM.D.   On: 07/13/2022 23:02   DG Chest 2 View  Result Date: 07/13/2022 CLINICAL DATA:  Substernal chest pain radiating to the left chest EXAM: CHEST - 2 VIEW COMPARISON:  Chest radiograph dated 07/16/2004 FINDINGS: Slightly low lung volumes. No focal consolidations. Left basilar patchy opacity. No pleural effusion or pneumothorax. The heart size and mediastinal contours are within normal limits. Tortuous outlines  of the thoracic aorta. The visualized skeletal structures are unremarkable. Surgical clip projecting over the right axilla and breast. IMPRESSION: Left basilar patchy opacity, likely atelectasis. Otherwise no acute cardiopulmonary process. Electronically Signed   By: Darrin Nipper M.D.   On: 07/13/2022 16:29    Procedures Procedures   Medications Ordered in ED Medications  potassium chloride SA (KLOR-CON M) CR tablet 40 mEq (40 mEq Oral Given 07/13/22 2104)  iohexol (OMNIPAQUE) 350 MG/ML injection 100 mL (60 mLs Intravenous Contrast Given 07/13/22 2252)    ED Course/ Medical Decision Making/ A&P Clinical Course as of 07/13/22 2330  Mon Jul 13, 2022  2145 D-Dimer, Quant(!): 0.72 [CG]    Clinical Course User Index [CG] Azucena Cecil, PA-C                           Medical Decision Making Amount and/or Complexity of Data Reviewed Labs: ordered. Radiology:  ordered.   71 year old female presents to ED for evaluation.  Please see HPI for further details.  On my examination the patient is afebrile and nontachycardic.  The patient lung sounds are clear bilaterally, she is not hypoxic.  Patient abdomen soft and compressible throughout.  Patient neurological examination shows no focal neurodeficits.  Due to patient complaint we will proceed with following labs and imaging studies to include CBC, BMP, troponin x2, EKG, chest x-ray.  We will also proceed with D-dimer.  Patient CBC unremarkable.  Patient BMP was noted to have a decreased potassium to 3.3 which was repleted with 40 mEq of oral potassium.  The patient troponin was 3 and 5 respectively.  The patient chest x-ray shows no consolidations or effusions, cardiomegaly.  The patient EKG is nonischemic.  D-dimer was elevated so decision was made to proceed with CT angio imaging.  CT angio shows no evidence of PE.  At this time, patient will be sent for discharge.  The patient will be advised to follow back up with her PCP.  Patient advised to return to the ED with any new or worsening signs or symptoms.  Patient voiced understanding.  Patient stable for discharge.  Final Clinical Impression(s) / ED Diagnoses Final diagnoses:  Chest pain, unspecified type    Rx / DC Orders ED Discharge Orders     None         Lawana Chambers 07/13/22 2330    Sherwood Gambler, MD 07/14/22 1701

## 2022-08-10 ENCOUNTER — Other Ambulatory Visit (HOSPITAL_COMMUNITY): Payer: Self-pay | Admitting: Nurse Practitioner

## 2022-08-10 DIAGNOSIS — E041 Nontoxic single thyroid nodule: Secondary | ICD-10-CM

## 2022-08-11 ENCOUNTER — Encounter: Payer: Self-pay | Admitting: General Practice

## 2022-08-11 NOTE — Progress Notes (Signed)
Claire Daft, MD  Allen Kell, NT Ok for US guided FNA of right inferior nodule based on [facility] report.  [place]       Previous Messages    ----- Message ----- From: Allen Kell, NT Sent: 08/10/2022  11:53 AM EST To: Ir Procedure Requests Subject: [place] FNA Biopsy                                  Procedure:US FNA Biopsy thyroid  Reason: Thyroid nodule  History: US Thyroid in chart with [facility]  Provider: Ferd Hibbs, NP  Contact: [phone]

## 2022-08-17 ENCOUNTER — Other Ambulatory Visit: Payer: Self-pay | Admitting: *Deleted

## 2022-08-17 DIAGNOSIS — C50411 Malignant neoplasm of upper-outer quadrant of right female breast: Secondary | ICD-10-CM

## 2022-08-17 DIAGNOSIS — Z86000 Personal history of in-situ neoplasm of breast: Secondary | ICD-10-CM

## 2022-08-17 DIAGNOSIS — Z170 Estrogen receptor positive status [ER+]: Secondary | ICD-10-CM

## 2022-08-17 MED ORDER — ANASTROZOLE 1 MG PO TABS: 1.0000 mg | ORAL_TABLET | Freq: Every day | ORAL | 1 refills | Status: DC

## 2022-08-17 NOTE — Telephone Encounter (Signed)
Patient called and requested prescription for Anastrozole be sent to [facility] as they are changing their medications to mail order. Previous prescriptions sent to [facility]. [facility] requested a new RX.

## 2022-08-18 ENCOUNTER — Telehealth: Payer: Self-pay

## 2022-08-18 ENCOUNTER — Telehealth: Payer: Self-pay | Admitting: Adult Health

## 2022-08-18 NOTE — Telephone Encounter (Signed)
Called pt to advise Anastrozole has been sent to [facility] per her request. She verbalized understanding.

## 2022-08-18 NOTE — Telephone Encounter (Signed)
Rescheduled appointment per provider template. Patient is aware. 

## 2022-08-28 ENCOUNTER — Ambulatory Visit (HOSPITAL_COMMUNITY)
Admission: RE | Admit: 2022-08-28 | Discharge: 2022-08-28 | Disposition: A | Discharge status: Home / Self Care | Payer: Medicare Other | Source: Ambulatory Visit | Attending: Nurse Practitioner | Admitting: Nurse Practitioner

## 2022-08-28 ENCOUNTER — Encounter (HOSPITAL_COMMUNITY): Payer: Self-pay

## 2022-08-28 ENCOUNTER — Other Ambulatory Visit: Payer: Self-pay

## 2022-08-28 DIAGNOSIS — E041 Nontoxic single thyroid nodule: Secondary | ICD-10-CM | POA: Diagnosis present

## 2022-08-28 DIAGNOSIS — K219 Gastro-esophageal reflux disease without esophagitis: Secondary | ICD-10-CM | POA: Insufficient documentation

## 2022-08-28 DIAGNOSIS — E119 Type 2 diabetes mellitus without complications: Secondary | ICD-10-CM | POA: Diagnosis not present

## 2022-08-28 DIAGNOSIS — I10 Essential (primary) hypertension: Secondary | ICD-10-CM | POA: Diagnosis not present

## 2022-08-28 DIAGNOSIS — E785 Hyperlipidemia, unspecified: Secondary | ICD-10-CM | POA: Insufficient documentation

## 2022-08-28 MED ORDER — MIDAZOLAM HCL 2 MG/2ML IJ SOLN
INTRAMUSCULAR | Status: AC | PRN
  Administered 2022-08-28: 1 mg via INTRAVENOUS

## 2022-08-28 MED ORDER — LIDOCAINE HCL (PF) 1 % IJ SOLN
INTRAMUSCULAR | Status: AC
  Filled 2022-08-28: qty 30

## 2022-08-28 MED ORDER — FENTANYL CITRATE (PF) 100 MCG/2ML IJ SOLN
INTRAMUSCULAR | Status: AC | PRN
  Administered 2022-08-28: 50 ug via INTRAVENOUS

## 2022-08-28 MED ORDER — SODIUM CHLORIDE 0.9 % IV SOLN
INTRAVENOUS | Status: AC | PRN
  Administered 2022-08-28: 10 mL/h via INTRAVENOUS

## 2022-08-28 MED ORDER — FENTANYL CITRATE (PF) 100 MCG/2ML IJ SOLN
INTRAMUSCULAR | Status: AC
  Filled 2022-08-28: qty 2

## 2022-08-28 MED ORDER — MIDAZOLAM HCL 2 MG/2ML IJ SOLN
INTRAMUSCULAR | Status: AC
  Filled 2022-08-28: qty 2

## 2022-08-28 MED ORDER — LIDOCAINE HCL (PF) 1 % IJ SOLN
2.0000 mL | Freq: Once | INTRAMUSCULAR | Status: AC
  Administered 2022-08-28: 2 mL via INTRADERMAL

## 2022-08-28 NOTE — H&P (Signed)
Chief Complaint: Patient was seen in consultation today for thyroid nodule  Referring Physician(s): Ferd Hibbs  Supervising Physician: Daryll Brod  Patient Status: [facility] - Out-pt  History of Present Illness: Claire Bernard is a 71 y.o. female with history of GERD, HLD, HTN, DM who recently underwent work up for chest pain, possible cardiac event at [employer].  CT imaging showed the presence of thyroid nodules and she has pursued additional work-up for same.  US Thyroid report from [place] shows at least 3 thyroid nodules. Request made for biopsy.  Case approved by Dr. Anselm Pancoast who approves right inferior nodule biopsy.   Patient presents for procedure today.  She has been scheduled with sedation.  She is NPO.  Does not take blood thinners.  Her husband is available for post-procedure care and transportation.  She is agreeable to proceed.   Past Medical History:  Diagnosis Date   Cancer ([place]) 07/2004   breast cancer   Chest pressure    Clotting disorder ([place]) 1970's   from birthcontrol    Esophageal spasm    Fatigue    GERD (gastroesophageal reflux disease)    History of dizziness    Hyperlipidemia    Hypertension    Laryngitis    Low bone mass    Personal history of radiation therapy 07/2004   left breast   Pre-diabetes     Past Surgical History:  Procedure Laterality Date   ANKLE FRACTURE SURGERY     BREAST LUMPECTOMY  07/17/2004   left breast   BREAST LUMPECTOMY WITH RADIOACTIVE SEED AND SENTINEL LYMPH NODE BIOPSY Right 12/15/2021   Procedure: RIGHT BREAST LUMPECTOMY WITH RADIOACTIVE SEED AND SENTINEL LYMPH NODE BIOPSY;  Surgeon: Coralie Keens, MD;  Location: [facility];  Service: General;  Laterality: Right;   cataract surg     KNEE SURGERY     Arthroscopic   SHOULDER SURGERY     VAGINAL HYSTERECTOMY      Allergies: Patient has no known allergies.  Medications: Prior to Admission medications   Medication Sig Start Date End Date  Taking? Authorizing Provider  anastrozole (ARIMIDEX) 1 MG tablet Take 1 tablet (1 mg total) by mouth daily. 08/17/22  Yes Iruku, Arletha Pili, MD  busPIRone (BUSPAR) 5 MG tablet Take 5 mg by mouth 2 (two) times daily.   Yes [provider]  carboxymethylcellulose (REFRESH TEARS) 0.5 % SOLN Place 1 drop into both eyes daily as needed (dry eyes).   Yes [provider]  cetirizine (ZYRTEC) 10 MG tablet Take 10 mg by mouth daily.   Yes [provider]  Cholecalciferol (VITAMIN D-3) 125 MCG (5000 UT) TABS Take 5,000 mg by mouth every morning.   Yes [provider]  cyanocobalamin (VITAMIN B12) 1000 MCG tablet Take 1,000 mcg by mouth every morning.   Yes [provider]  estradiol (ESTRACE VAGINAL) 0.1 MG/GM vaginal cream INSERT ONE-HALF GRAM INTRAVAGINALLY TWO TIMES A WEEK AT BEDTIME Patient taking differently: Place 1 Applicatorful vaginally once a week.  AT BEDTIME 12/12/21  Yes Salvadore Dom, MD  hydrochlorothiazide (HYDRODIURIL) 25 MG tablet Take 25 mg by mouth every morning.   Yes [provider]  lisinopril (ZESTRIL) 20 MG tablet Take 20 mg by mouth at bedtime.   Yes [provider]  Magnesium Citrate 100 MG CAPS Take 100 mg by mouth daily.   Yes [provider]  naproxen (NAPROSYN) 500 MG tablet Take 500 mg by mouth every 12 (twelve) hours.   Yes [provider]  traZODone (DESYREL) 150 MG tablet Take 75 mg by mouth at bedtime. 2100   Yes [provider]     Family History  Problem Relation Age of Onset   Atrial fibrillation Father    Emphysema Father    Heart attack Father        multiple   Heart disease Father    Hypertension Sister    Stroke Sister        x6 strokes is 49 y/o   Asthma Sister    Diabetes Sister    Depression Brother    Heart disease Brother    Coronary artery disease Brother        x3 CABG   Atrial fibrillation Maternal Grandmother    Diabetes Maternal Grandmother     Heart disease Maternal Grandmother    Hypertension Maternal Grandmother    Atrial fibrillation Maternal Grandfather    Heart disease Maternal Grandfather    Hypertension Maternal Grandfather    Breast cancer Paternal Grandmother        dx. 8s   Cancer Paternal Grandmother        uterine or ovarian, unsure if breast cancer metastasis or second primary   Heart disease Paternal Grandfather    Hypertension Paternal Grandfather    Atrial fibrillation Paternal Grandfather    Breast cancer Paternal Aunt        dx. 12s   Colon cancer Paternal Aunt    Colon cancer Paternal Uncle        dx. 20s   Lung cancer Paternal Uncle     Social History   Socioeconomic History   Marital status: Married    Spouse name: Not on file   Number of children: 2   Years of education: Not on file   Highest education level: Not on file  Occupational History    Employer: [id number]  Tobacco Use   Smoking status: Former    Packs/day: 0.50    Years: 20.00    Total pack years: 10.00    Types: Cigarettes    Quit date: 08/31/1990    Years since quitting: 32.0   Smokeless tobacco: Never  Vaping Use   Vaping Use: Never used  Substance and Sexual Activity   Alcohol use: Yes    Alcohol/week: 0.0 standard drinks of alcohol    Comment: Rare   Drug use: No   Sexual activity: Yes    Birth control/protection: Surgical, Post-menopausal    Comment: 1st intercourse 71 yo-Fewer than 5 partners  Other Topics Concern   Not on file  Social History Narrative   Not on file   Social Determinants of Health   Financial Resource Strain: Low Risk  (11/26/2021)   Overall Financial Resource Strain (CARDIA)    Difficulty of Paying Living Expenses: Not hard at all  Food Insecurity: No Food Insecurity (11/26/2021)   Hunger Vital Sign    Worried About Running Out of Food in the Last Year: Never true    [place] in the Last Year: Never true  Transportation Needs: No Transportation Needs (11/26/2021)   PRAPARE -  Hydrologist (Medical): No    Lack of Transportation (Non-Medical): No  Physical Activity: Not on file  Stress: Not on file  Social Connections: Not on file     Review of Systems: A 12 point ROS discussed and pertinent positives are indicated in the HPI above.  All other systems are negative.  Review of Systems  Constitutional:  Negative for fatigue and fever.  Respiratory:  Negative for cough and shortness of breath.   Cardiovascular:  Negative for chest pain.  Gastrointestinal:  Negative for abdominal pain, nausea and vomiting.  Musculoskeletal:  Negative for back pain.  Psychiatric/Behavioral:  Negative for behavioral problems and confusion.     Vital Signs: BP 129/69   Pulse 76   Temp 97.8 F (36.6 C) (Temporal)   Resp 16   Ht '5\' 5"'$  (1.651 m)   Wt 195 lb (88.5 kg)   SpO2 92%   BMI 32.45 kg/m   Physical Exam Vitals and nursing note reviewed.  Constitutional:      General: She is not in acute distress.    Appearance: Normal appearance. She is not ill-appearing.  HENT:     Mouth/Throat:     Mouth: Mucous membranes are moist.     Pharynx: Oropharynx is clear.  Cardiovascular:     Rate and Rhythm: Normal rate and regular rhythm.  Pulmonary:     Effort: Pulmonary effort is normal. No respiratory distress.     Breath sounds: Normal breath sounds.  Abdominal:     General: Abdomen is flat.     Palpations: Abdomen is soft.  Musculoskeletal:     Cervical back: Normal range of motion. No tenderness.  Skin:    General: Skin is warm and dry.  Neurological:     General: No focal deficit present.     Mental Status: She is alert and oriented to person, place, and time. Mental status is at baseline.  Psychiatric:        Mood and Affect: Mood normal.        Behavior: Behavior normal.        Thought Content: Thought content normal.        Judgment: Judgment normal.      MD Evaluation Airway: WNL Heart: WNL Abdomen: WNL Chest/ Lungs:  WNL ASA  Classification: 3 Mallampati/Airway Score: Two   Imaging: No results found.  Labs:  CBC: Recent Labs    11/26/21 0811 07/13/22 1543  WBC 8.6 7.9  HGB 13.2 13.2  HCT 37.2 37.4  PLT 252 268    COAGS: No results for input(s): "INR", "APTT" in the last 8760 hours.  BMP: Recent Labs    11/26/21 0811 07/13/22 1543  NA 138 140  K 3.5 3.3*  CL 105 101  CO2 26 28  GLUCOSE 125* 93  BUN 24* 20  CALCIUM 8.9 9.7  CREATININE 0.68 0.76  GFRNONAA >60 >60    LIVER FUNCTION TESTS: Recent Labs    11/26/21 0811  BILITOT 0.4  AST 11*  ALT 14  ALKPHOS 105  PROT 7.0  ALBUMIN 4.0    TUMOR MARKERS: No results for input(s): "AFPTM", "CEA", "CA199", "CHROMGRNA" in the last 8760 hours.  Assessment and Plan: Patient with past medical history of DM, HTN presents with complaint of thyroid nodule.  IR consulted for biopsy at the request of Ferd Hibbs, NP. Case reviewed by Dr. Anselm Pancoast who approves patient for procedure.  Patient presents today in their usual state of health.  She has been NPO and is not currently on blood thinners.   Risks and benefits of biopsy was discussed with the patient and/or patient's family including, but not limited to bleeding, infection, damage to adjacent structures or low yield requiring additional tests.  All of the questions were answered and there is agreement to proceed.  Consent signed and in chart.  Thank you for  this interesting consult.  I greatly enjoyed meeting Claire Bernard and look forward to participating in their care.  A copy of this report was sent to the requesting provider on this date.  Electronically Signed: Docia Barrier, PA 08/28/2022, 8:52 AM   I spent a total of  15 Minutes   in face to face in clinical consultation, greater than 50% of which was counseling/coordinating care for thyroid nodule.

## 2022-08-28 NOTE — Procedures (Signed)
Interventional Radiology Procedure Note  Procedure: [place] RT THYROID NODULE FNA    Complications: None  Estimated Blood Loss:  MIN  Findings: FNA X 5     M. Daryll Brod, MD

## 2022-09-02 LAB — CYTOLOGY - NON PAP

## 2022-09-10 ENCOUNTER — Encounter: Payer: Self-pay | Admitting: Adult Health

## 2022-09-10 ENCOUNTER — Ambulatory Visit: Payer: Medicare Other | Admitting: Hematology and Oncology

## 2022-09-10 ENCOUNTER — Inpatient Hospital Stay: Payer: Medicare Other | Attending: Hematology and Oncology | Admitting: Adult Health

## 2022-09-10 VITALS — BP 139/84 | HR 88 | Temp 97.2°F | Resp 19 | Wt 194.5 lb

## 2022-09-10 DIAGNOSIS — Z170 Estrogen receptor positive status [ER+]: Secondary | ICD-10-CM | POA: Insufficient documentation

## 2022-09-10 DIAGNOSIS — Z79899 Other long term (current) drug therapy: Secondary | ICD-10-CM | POA: Diagnosis not present

## 2022-09-10 DIAGNOSIS — Z79811 Long term (current) use of aromatase inhibitors: Secondary | ICD-10-CM | POA: Insufficient documentation

## 2022-09-10 DIAGNOSIS — C50411 Malignant neoplasm of upper-outer quadrant of right female breast: Secondary | ICD-10-CM | POA: Insufficient documentation

## 2022-09-10 DIAGNOSIS — M85851 Other specified disorders of bone density and structure, right thigh: Secondary | ICD-10-CM | POA: Diagnosis not present

## 2022-09-10 DIAGNOSIS — Z923 Personal history of irradiation: Secondary | ICD-10-CM | POA: Insufficient documentation

## 2022-09-10 NOTE — Progress Notes (Signed)
SURVIVORSHIP VISIT:     BRIEF ONCOLOGIC HISTORY:  Oncology History  Malignant neoplasm of upper-outer quadrant of right breast in female, estrogen receptor positive ([place])  10/10/2021 Imaging   Screening mammogram showed a possible mass in the right breast.  Diagnostic mammogram confirmed an irregular isoechoic mass in the outer breast at the middle depth measuring just over 1 cm associated with architectural distortion.  No associated suspicious calcifications.  Targeted ultrasound was performed, demonstrating an irregular hypoechoic mass with vague margins at the 9 o'clock position 4 cm from nipple at middle depth measuring approximately 1.1 x 0.9 x 1 cm demonstrating posterior acoustic shadowing and no internal power Doppler flow corresponding to the screening mammographic finding.  Right axilla demonstrated no pathologic lymphadenopathy. Ultrasound of the area showed highly suspicious 1.1 cm mass involving the outer right breast at 9 o'clock position 4 cm from the nipple.  No pathologic right axillary lymphadenopathy.    11/18/2021 Pathology Results   Right breast needle core biopsy 9 o'clock position showed invasive carcinoma of no special type, grade 2, 11 mm in greatest length, ER 100% positive strong staining intensity, PR 100% positive strong staining intensity, Ki-67 of 40% and HER2 negative by IHC.   11/24/2021 Initial Diagnosis   Malignant neoplasm of upper-outer quadrant of right breast in female, estrogen receptor positive ([place])    Genetic Testing   [facility] CancerNext-Expanded Panel was Negative. 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 Report date is 12/15/2021.  The CancerNext-Expanded gene panel offered by [facility] and includes sequencing, rearrangement, and RNA analysis for the following 77 genes: AIP, ALK, APC, ATM, AXIN2, BAP1, BARD1, BLM, BMPR1A, BRCA1, BRCA2, BRIP1, CDC73, CDH1, CDK4, CDKN1B, CDKN2A, CHEK2, CTNNA1, DICER1, FANCC, FH,  FLCN, GALNT12, KIF1B, LZTR1, MAX, MEN1, MET, MLH1, MSH2, MSH3, MSH6, MUTYH, NBN, NF1, NF2, NTHL1, PALB2, PHOX2B, PMS2, POT1, PRKAR1A, PTCH1, PTEN, RAD51C, RAD51D, RB1, RECQL, RET, SDHA, SDHAF2, SDHB, SDHC, SDHD, SMAD4, SMARCA4, SMARCB1, SMARCE1, STK11, SUFU, TMEM127, TP53, TSC1, TSC2, VHL and XRCC2 (sequencing and deletion/duplication); EGFR, EGLN1, HOXB13, KIT, MITF, PDGFRA, POLD1, and POLE (sequencing only); EPCAM and GREM1 (deletion/duplication only).    12/15/2021 Surgery   She had right breast lumpectomy with sentinel lymph node biopsy.  Pathology showed invasive ductal carcinoma, 1.7 cm, grade 3 along with high-grade DCIS, resection margins are negative for carcinoma sentinel lymph node negative.  Previous prognostic showed ER 100% positive strong staining, PR 100% positive strong staining, KI however at 40%, HER2 negative.    12/29/2021 Oncotype testing   Oncotype at 15, distant recurrence risk at 9 years of 4% and absolute chemotherapy benefit less than 1%.   01/28/2022 - 02/24/2022 Radiation Therapy   Site Technique Total Dose (Gy) Dose per Fx (Gy) Completed Fx Beam Energies  Breast, Right: Breast_R 3D 40.05/40.05 2.67 15/15 10X  Breast, Right: Breast_R_Bst 3D 10/10 2 5/5 6X     02/2022 -  Anti-estrogen oral therapy   Anastrozole     INTERVAL HISTORY:  Claire Bernard to review her survivorship care plan detailing her treatment course for breast cancer, as well as monitoring long-term side effects of that treatment, education regarding health maintenance, screening, and overall wellness and health promotion.     Overall, Claire Bernard reports feeling quite well.  She is taking anastrozole daily with good tolerance.  She does have a slight increase in joint aches and pains but is managing this.  REVIEW OF SYSTEMS:  Review of Systems  Constitutional:  Negative for  appetite change, chills, fatigue, fever and unexpected weight change.  HENT:   Negative for hearing loss, lump/mass and  trouble swallowing.   Eyes:  Negative for eye problems and icterus.  Respiratory:  Negative for chest tightness, cough and shortness of breath.   Cardiovascular:  Negative for chest pain, leg swelling and palpitations.  Gastrointestinal:  Negative for abdominal distention, abdominal pain, constipation, diarrhea, nausea and vomiting.  Endocrine: Negative for hot flashes.  Genitourinary:  Negative for difficulty urinating.   Musculoskeletal:  Negative for arthralgias.  Skin:  Negative for itching and rash.  Neurological:  Negative for dizziness, extremity weakness, headaches and numbness.  Hematological:  Negative for adenopathy. Does not bruise/bleed easily.  Psychiatric/Behavioral:  Negative for depression. The patient is not nervous/anxious.   Breast: Denies any new nodularity, masses, tenderness, nipple changes, or nipple discharge.    PAST MEDICAL/SURGICAL HISTORY:  Past Medical History:  Diagnosis Date   [place]cer ([place] 07/2004   breast cancer   Chest pressure    Clotting disorder ([place]) 1970's   from birthcontrol    Esophageal spasm    Fatigue    GERD (gastroesophageal reflux disease)    History of dizziness    Hyperlipidemia    Hypertension    Laryngitis    Low bone mass    Personal history of radiation therapy 07/2004   left breast   Pre-diabetes    Past Surgical History:  Procedure Laterality Date   ANKLE FRACTURE SURGERY     BREAST LUMPECTOMY  07/17/2004   left breast   BREAST LUMPECTOMY WITH RADIOACTIVE SEED AND SENTINEL LYMPH NODE BIOPSY Right 12/15/2021   Procedure: RIGHT BREAST LUMPECTOMY WITH RADIOACTIVE SEED AND SENTINEL LYMPH NODE BIOPSY;  Surgeon: Coralie Keens, MD;  Location: [place];  Service: General;  Laterality: Right;   cataract surg     KNEE SURGERY     Arthroscopic   SHOULDER SURGERY     VAGINAL HYSTERECTOMY       ALLERGIES:  No Known Allergies   CURRENT MEDICATIONS:  Outpatient Encounter Medications as of 09/10/2022   Medication Sig   anastrozole (ARIMIDEX) 1 MG tablet Take 1 tablet (1 mg total) by mouth daily.   busPIRone (BUSPAR) 5 MG tablet Take 5 mg by mouth 2 (two) times daily.   carboxymethylcellulose (REFRESH TEARS) 0.5 % SOLN Place 1 drop into both eyes daily as needed (dry eyes).   cetirizine (ZYRTEC) 10 MG tablet Take 10 mg by mouth daily.   Cholecalciferol (VITAMIN D-3) 125 MCG (5000 UT) TABS Take 5,000 mg by mouth every morning.   cyanocobalamin (VITAMIN B12) 1000 MCG tablet Take 1,000 mcg by mouth every morning.   estradiol (ESTRACE VAGINAL) 0.1 MG/GM vaginal cream INSERT ONE-HALF GRAM INTRAVAGINALLY TWO TIMES A WEEK AT BEDTIME (Patient taking differently: Place 1 Applicatorful vaginally once a week.  AT BEDTIME)   hydrochlorothiazide (HYDRODIURIL) 25 MG tablet Take 25 mg by mouth every morning.   lisinopril (ZESTRIL) 20 MG tablet Take 20 mg by mouth at bedtime.   Magnesium Citrate 100 MG CAPS Take 100 mg by mouth daily.   naproxen (NAPROSYN) 500 MG tablet Take 500 mg by mouth every 12 (twelve) hours.   traZODone (DESYREL) 150 MG tablet Take 75 mg by mouth at bedtime. 2100   No facility-administered encounter medications on file as of 09/10/2022.     ONCOLOGIC FAMILY HISTORY:  Family History  Problem Relation Age of Onset   Atrial fibrillation Father    Emphysema Father  Heart attack Father        multiple   Heart disease Father    Hypertension Sister    Stroke Sister        x6 strokes is 24 y/o   Asthma Sister    Diabetes Sister    Depression Brother    Heart disease Brother    Coronary artery disease Brother        x3 CABG   Atrial fibrillation Maternal Grandmother    Diabetes Maternal Grandmother    Heart disease Maternal Grandmother    Hypertension Maternal Grandmother    Atrial fibrillation Maternal Grandfather    Heart disease Maternal Grandfather    Hypertension Maternal Grandfather    Breast cancer Paternal Grandmother        dx. 86s   Cancer Paternal Grandmother         uterine or ovarian, unsure if breast cancer metastasis or second primary   Heart disease Paternal Grandfather    Hypertension Paternal Grandfather    Atrial fibrillation Paternal Grandfather    Breast cancer Paternal Aunt        dx. 1s   Colon cancer Paternal Aunt    Colon cancer Paternal Uncle        dx. 42s   Lung cancer Paternal Uncle      SOCIAL HISTORY:  Social History   Socioeconomic History   Marital status: Married    Spouse name: Not on file   Number of children: 2   Years of education: Not on file   Highest education level: Not on file  Occupational History    Employer: [id number]  Tobacco Use   Smoking status: Former    Packs/day: 0.50    Years: 20.00    Total pack years: 10.00    Types: Cigarettes    Quit date: 08/31/1990    Years since quitting: 32.0   Smokeless tobacco: Never  Vaping Use   Vaping Use: Never used  Substance and Sexual Activity   Alcohol use: Yes    Alcohol/week: 0.0 standard drinks of alcohol    Comment: Rare   Drug use: No   Sexual activity: Yes    Birth control/protection: Surgical, Post-menopausal    Comment: 1st intercourse 72 yo-Fewer than 5 partners  Other Topics Concern   Not on file  Social History Narrative   Not on file   Social Determinants of Health   Financial Resource Strain: Low Risk  (11/26/2021)   Overall Financial Resource Strain (CARDIA)    Difficulty of Paying Living Expenses: Not hard at all  Food Insecurity: No Food Insecurity (11/26/2021)   Hunger Vital Sign    Worried About Running Out of Food in the Last Year: Never true    [place] in the Last Year: Never true  Transportation Needs: No Transportation Needs (11/26/2021)   PRAPARE - Hydrologist (Medical): No    Lack of Transportation (Non-Medical): No  Physical Activity: Not on file  Stress: Not on file  Social Connections: Not on file  Intimate Partner Violence: Not on file     OBSERVATIONS/OBJECTIVE:  BP  139/84 (BP Location: Left Arm, Patient Position: Sitting)   Pulse 88   Temp (!) 97.2 F (36.2 C) (Temporal)   Resp 19   Wt 194 lb 8 oz (88.2 kg)   SpO2 100%   BMI 32.37 kg/m  GENERAL: Patient is a well appearing female in no acute distress HEENT:  Sclerae anicteric.  Oropharynx clear and moist. No ulcerations or evidence of oropharyngeal candidiasis. Neck is supple.  NODES:  No cervical, supraclavicular, or axillary lymphadenopathy palpated.  BREAST EXAM: Right breast status postlumpectomy and radiation no sign of local recurrence left breast status postlumpectomy radiation no sign of local recurrence bilateral breast exam is benign. LUNGS:  Clear to auscultation bilaterally.  No wheezes or rhonchi. HEART:  Regular rate and rhythm. No murmur appreciated. ABDOMEN:  Soft, nontender.  Positive, normoactive bowel sounds. No organomegaly palpated. MSK:  No focal spinal tenderness to palpation. Full range of motion bilaterally in the upper extremities. EXTREMITIES:  No peripheral edema.   SKIN:  Clear with no obvious rashes or skin changes. No nail dyscrasia. NEURO:  Nonfocal. Well oriented.  Appropriate affect.  LABORATORY DATA:  None for this visit.  DIAGNOSTIC IMAGING:  None for this visit.      ASSESSMENT AND PLAN:  Ms.. Bernard is a pleasant 72 y.o. female with Stage IA right breast invasive ductal carcinoma, ER+/PR+/HER2-, diagnosed in 10/2021, treated with lumpectomy, adjuvant radiation therapy, and anti-estrogen therapy with Anastrozole beginning in 02/2022.  She presents to the Survivorship Clinic for our initial meeting and routine follow-up post-completion of treatment for breast cancer.    1. Stage IA right breast cancer:  Claire Bernard is continuing to recover from definitive treatment for breast cancer. She will follow-up with her medical oncologist, Dr. Chryl Heck in 6 months with history and physical exam per surveillance protocol.  She will continue her anti-estrogen  therapy with Anastrozole. Thus far, she is tolerating the Anastrozole well, with minimal side effects. She was instructed to make Dr. Lindi Adie or myself aware if she begins to experience any worsening side effects of the medication and I could see her back in clinic to help manage those side effects, as needed. Her mammogram is due 10/2022; orders placed today.   Today, a comprehensive survivorship care plan and treatment summary was reviewed with the patient today detailing her breast cancer diagnosis, treatment course, potential late/long-term effects of treatment, appropriate follow-up care with recommendations for the future, and patient education resources.  A copy of this summary, along with a letter will be sent to the patient's primary care provider via mail/fax/In Basket message after today's visit.    2.  Vaginal dryness: Well with Estrace cream that she uses once a week.  We did discuss that we do not know whether it is systemically absorbed and she is using such a small amount it is felt by her PCP and gyn to be okay for her to use.  She understands this and will continue.  3. Bone health:  Given Claire Bernard's age/history of breast cancer and her current treatment regimen including anti-estrogen therapy with Anastrozole, she is at risk for bone demineralization.  Her last DEXA scan was 10/26/2019, which showed osteopenia with a t score of -1.6 in the right femoral neck.  Recommended to continue calcium, vitamin D and weightbearing exercises and to repeat with her gynecologist.  she was given education on specific activities to promote bone health.  4. Cancer screening:  Due to Claire Bernard's history and her age, she should receive screening for skin cancers, colon cancer, and gynecologic cancers.  The information and recommendations are listed on the patient's comprehensive care plan/treatment summary and were reviewed in detail with the patient.    5. Health maintenance and wellness  promotion: Claire Bernard was encouraged to consume 5-7 servings of fruits and vegetables per day. We reviewed the "Nutrition  Rainbow" handout.  She was also encouraged to engage in moderate to vigorous exercise for 30 minutes per day most days of the week. We discussed the [employer] fitness program, which is designed for cancer survivors to help them become more physically fit after cancer treatments.  She was instructed to limit her alcohol consumption and continue to abstain from tobacco use.     6. Support services/counseling: It is not uncommon for this period of the patient's cancer care trajectory to be one of many emotions and stressors.  She was given information regarding our available services and encouraged to contact me with any questions or for help enrolling in any of our support group/programs.    Follow up instructions:    -Return to cancer center in 6 months for f/u with Dr. Chryl Heck  -Mammogram due in 10/2022 -Bone density testing in next 1-2 months with GYN -She is welcome to return back to the Survivorship Clinic at any time; no additional follow-up needed at this time.  -Consider referral back to survivorship as a long-term survivor for continued surveillance  The patient was provided an opportunity to ask questions and all were answered. The patient agreed with the plan and demonstrated an understanding of the instructions.   Total encounter time:35 minutes*in face-to-face visit time, chart review, lab review, care coordination, order entry, and documentation of the encounter time.    Wilber Bihari, NP 09/10/22 11:59 AM Medical Oncology and Hematology [facility] [place], [place] [address] Tel. [phone]    Fax. [phone]  *Total Encounter Time as defined by the Centers for Medicare and Medicaid Services includes, in addition to the face-to-face time of a patient visit (documented in the note above) non-face-to-face time:  obtaining and reviewing outside history, ordering and reviewing medications, tests or procedures, care coordination (communications with other health care professionals or caregivers) and documentation in the medical record.

## 2022-09-15 ENCOUNTER — Telehealth: Payer: Self-pay | Admitting: Pharmacist

## 2022-09-15 NOTE — Telephone Encounter (Signed)
Scheduled appointment per los. Left voicemail. 

## 2022-10-04 IMAGING — MG MM DIGITAL DIAGNOSTIC UNILAT*R* W/ TOMO W/ CAD
4 series · 4 of 12 positions shown · non-contrast
Comparison: Previous exam(s).

CLINICAL DATA: Recall from screening mammography, possible mass
involving the outer RIGHT breast at middle depth.

Personal history of malignant LEFT breast lumpectomy in July 2004, pathology DCIS.
EXAM:
DIGITAL DIAGNOSTIC UNILATERAL RIGHT MAMMOGRAM WITH TOMOSYNTHESIS AND
CAD; ULTRASOUND RIGHT BREAST LIMITED
TECHNIQUE: Right digital diagnostic mammography and breast tomosynthesis was
performed. The images were evaluated with computer-aided detection.;
Targeted ultrasound examination of the right breast was performed.

[R MLO synth-2D]
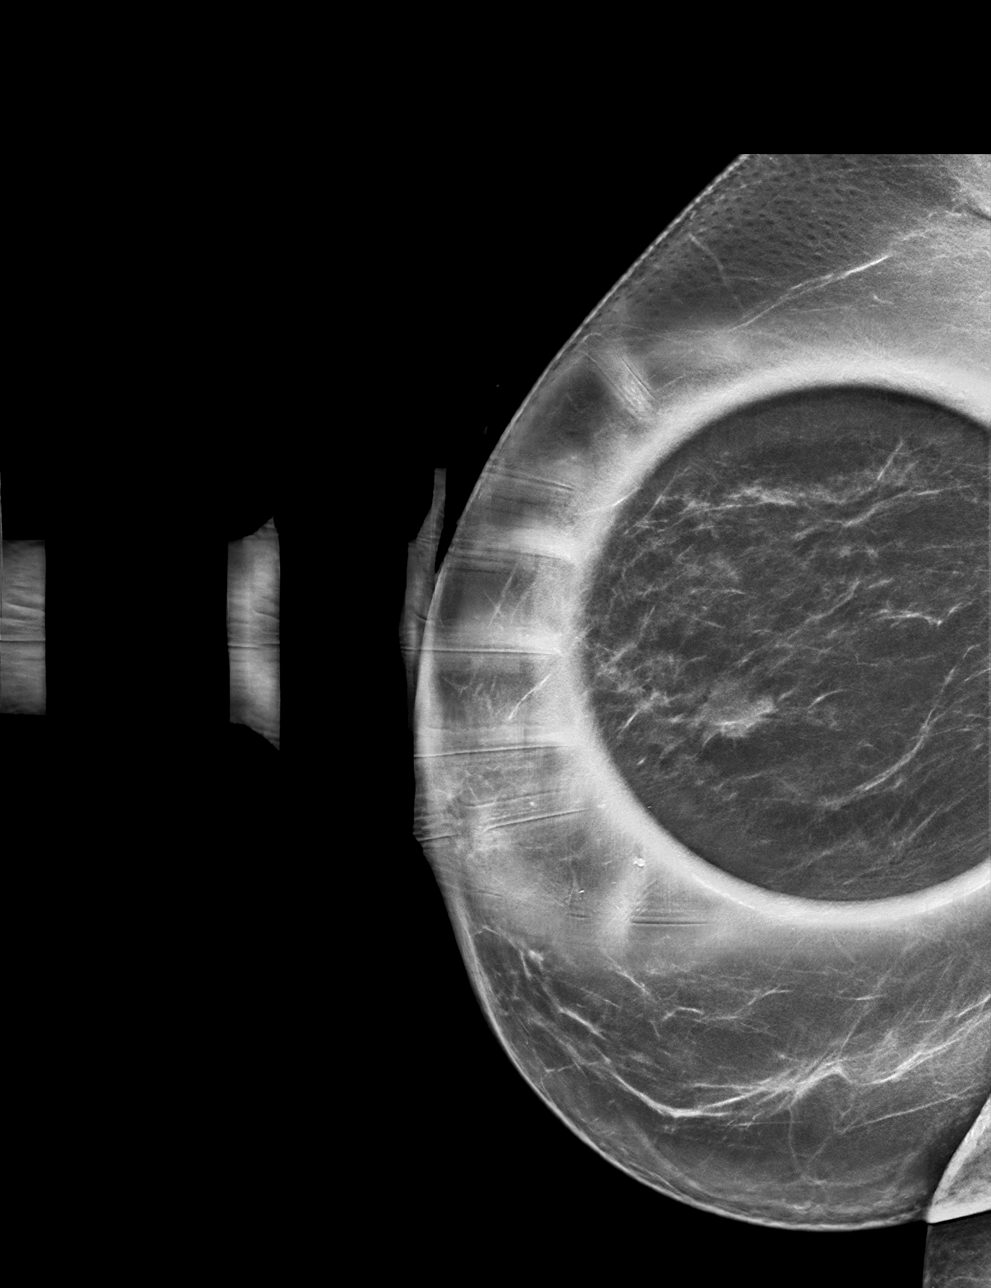

[R CC synth-2D]
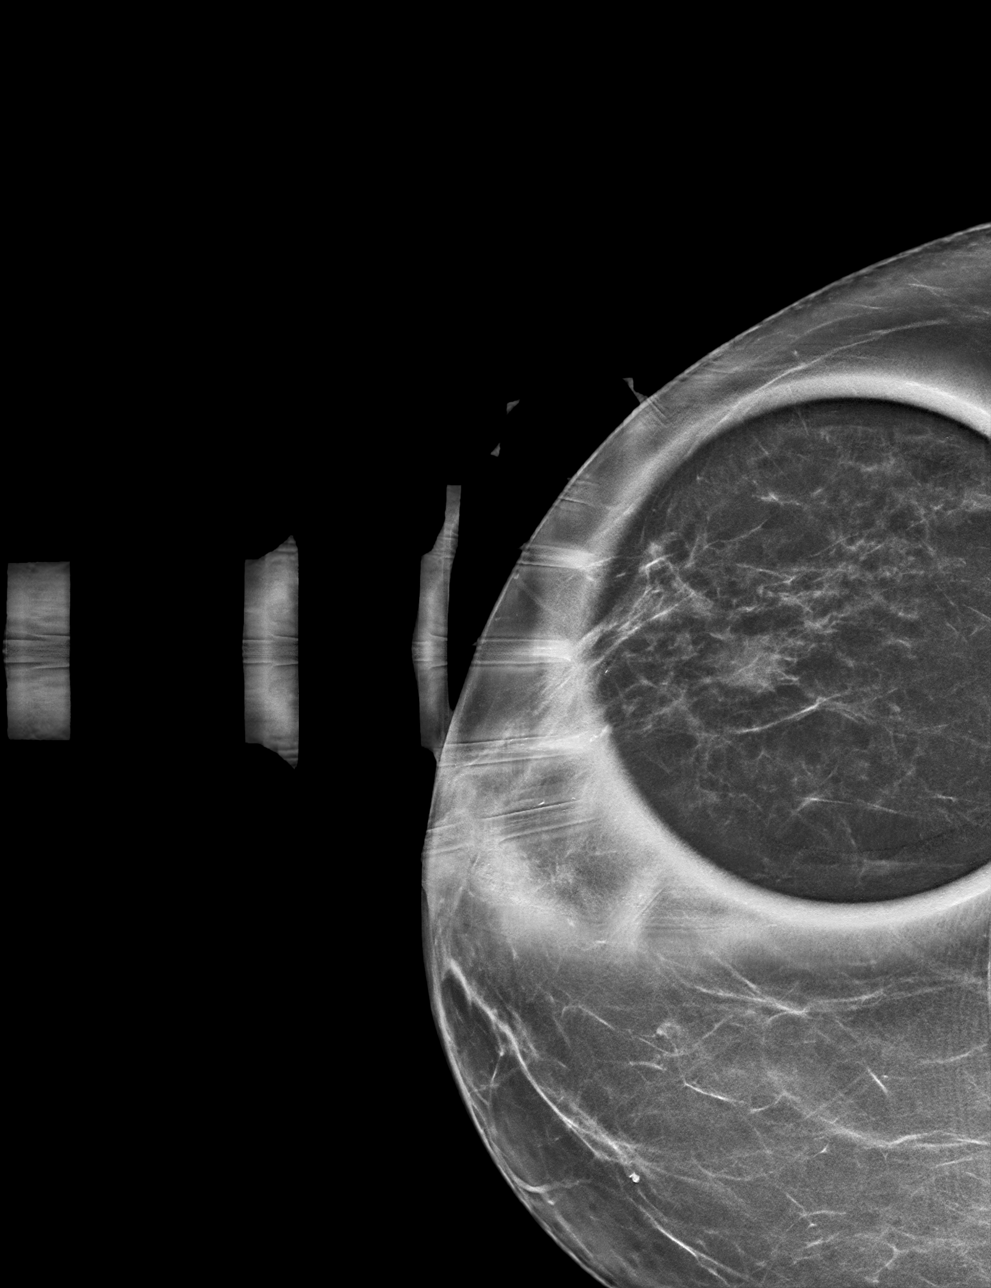

[R CC tomo · tomo slice 31/60.0]
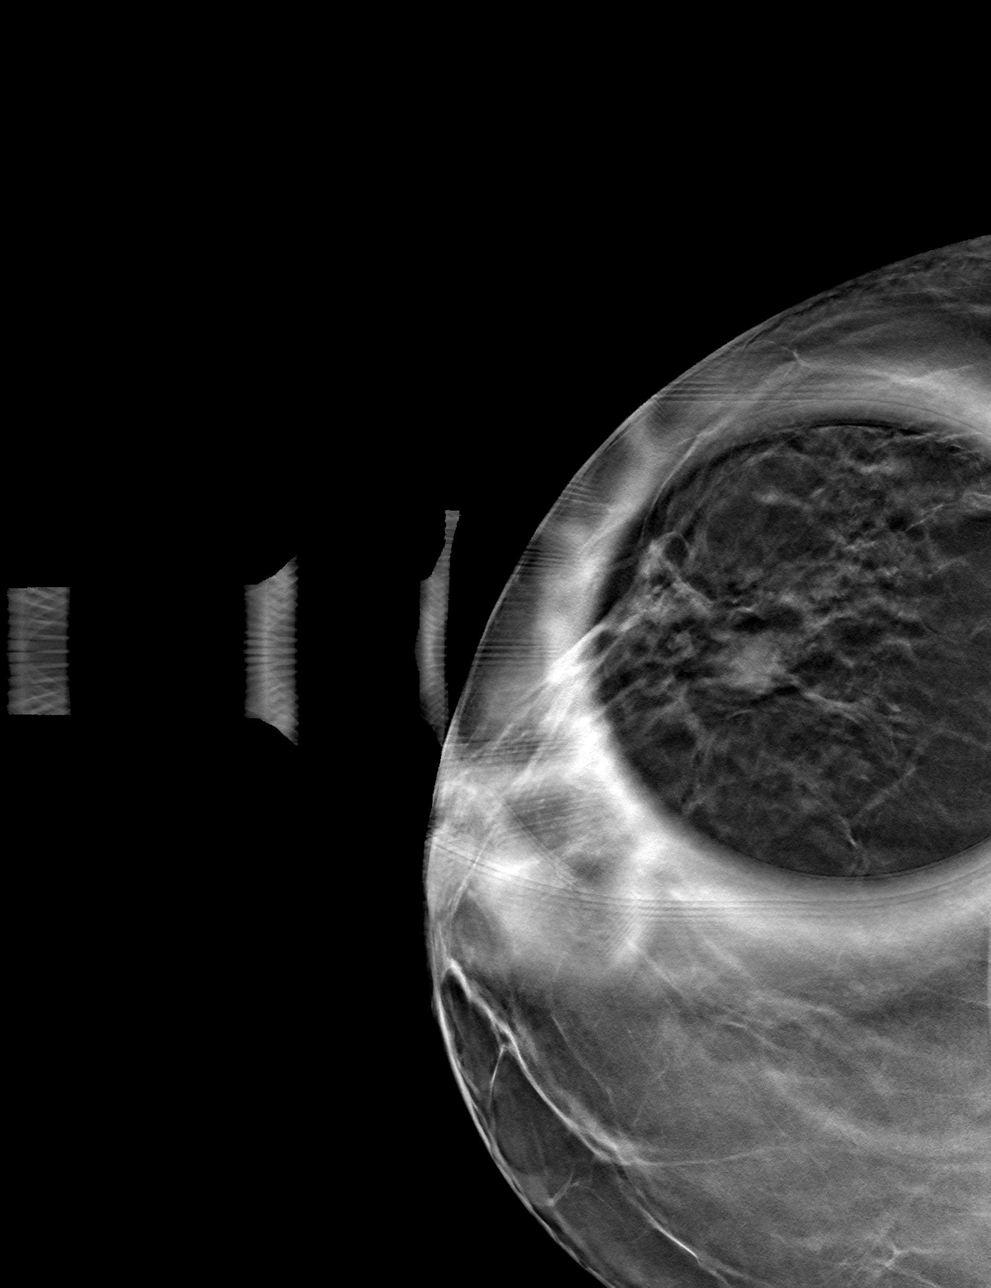

[R MLO tomo · tomo slice 36/71.0]
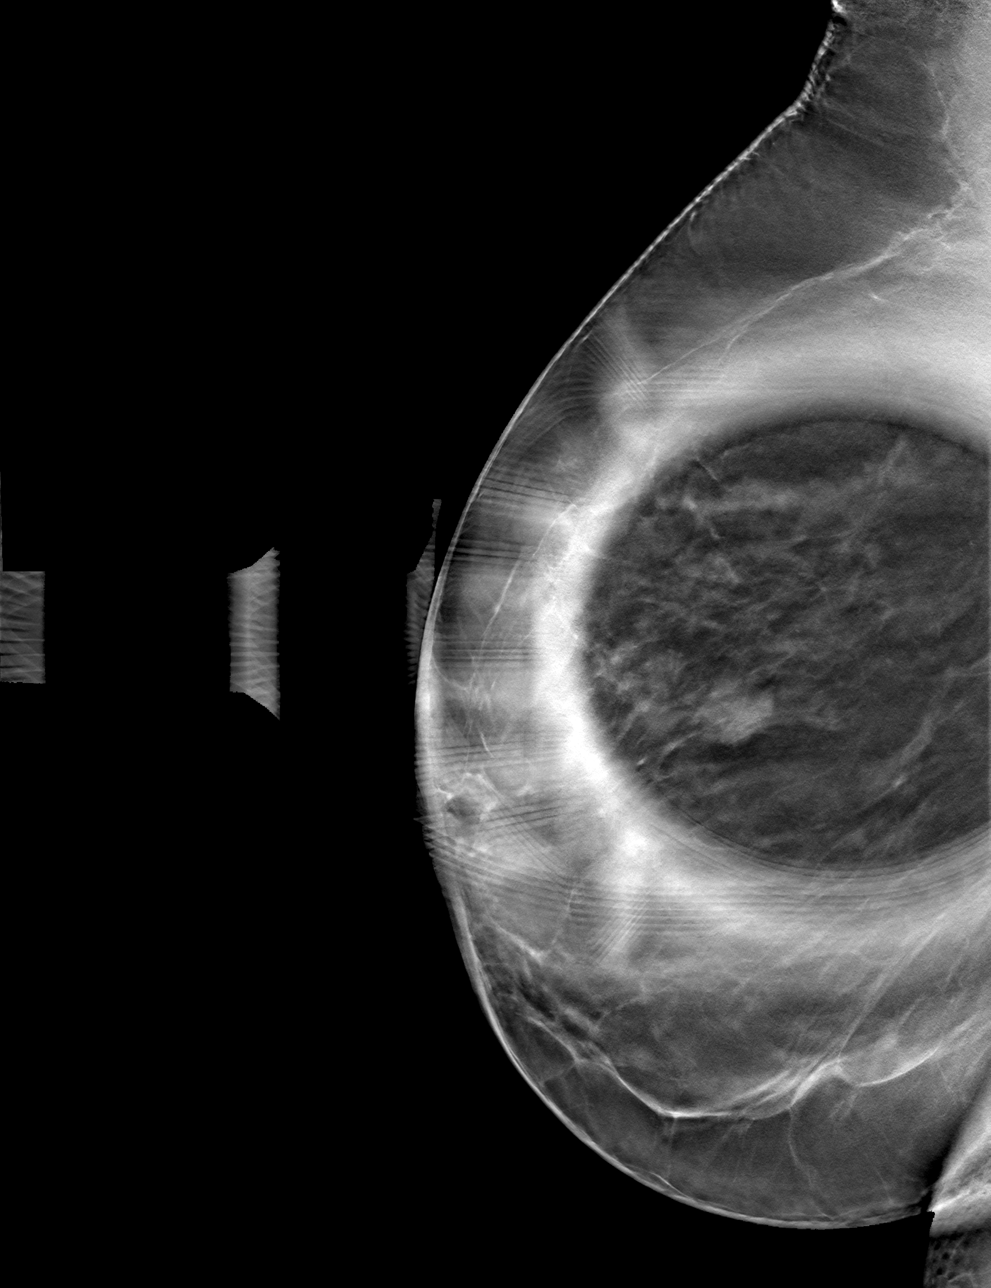

[4 of 12 positions shown; findings below may reference images not displayed]

ACR Breast Density Category b: There are scattered areas of
fibroglandular density.
FINDINGS: Spot-compression CC and MLO views of the area of concern were
obtained.

These confirm an irregular isoechoic mass in the outer breast at
middle depth measuring just over 1 cm, associated with architectural
distortion. There are no associated suspicious calcifications.

Targeted ultrasound is performed, demonstrating an irregular
hypoechoic mass with vague margins at the 9 o'clock position 4 cm
from nipple at middle depth measuring approximately 1.1 x 0.9 x
cm, demonstrating posterior acoustic shadowing and no internal power
Doppler flow, corresponding to the screening mammographic finding.

Sonographic evaluation of the RIGHT axilla demonstrates no
pathologic lymphadenopathy.
IMPRESSION: 1. Highly suspicious 1.1 cm mass involving the outer RIGHT breast at
the 9 o'clock position 4 cm from nipple.
2. No pathologic RIGHT axillary lymphadenopathy.

RECOMMENDATION:
Ultrasound-guided core needle biopsy of the RIGHT breast mass.

I have discussed the findings and recommendations with the patient.
The ultrasound core needle biopsy procedure was discussed with the
patient and her questions were answered. She wishes to proceed and
the biopsy has been scheduled by the Breast Imaging staff.

BI-RADS CATEGORY  5: Highly suggestive of malignancy.

## 2022-10-04 IMAGING — US US BREAST*R* LIMITED INC AXILLA
1 series · 11 of 11 positions shown · non-contrast
Comparison: Previous exam(s).

CLINICAL DATA: Recall from screening mammography, possible mass
involving the outer RIGHT breast at middle depth.

Personal history of malignant LEFT breast lumpectomy in July 2004, pathology DCIS.
EXAM:
DIGITAL DIAGNOSTIC UNILATERAL RIGHT MAMMOGRAM WITH TOMOSYNTHESIS AND
CAD; ULTRASOUND RIGHT BREAST LIMITED
TECHNIQUE: Right digital diagnostic mammography and breast tomosynthesis was
performed. The images were evaluated with computer-aided detection.;
Targeted ultrasound examination of the right breast was performed.

[Series 1: us breast*right* limited inc axilla · 0.06mm/px · 11 of 11 slices shown]
[im 1/11]
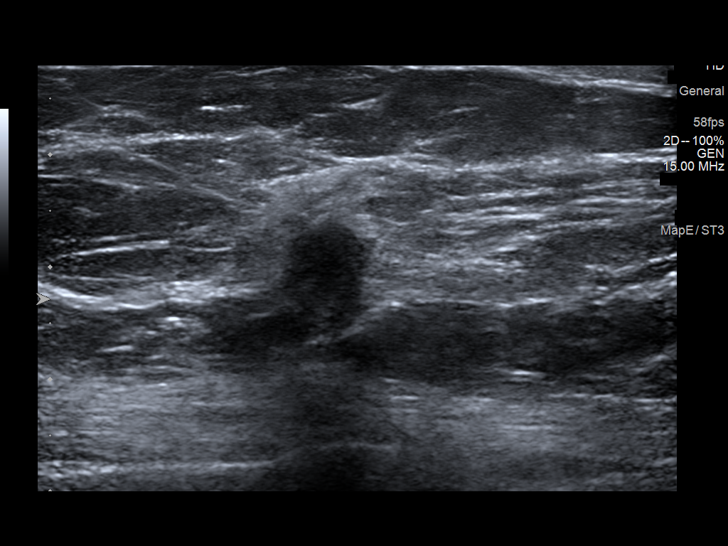
[im 2/11]
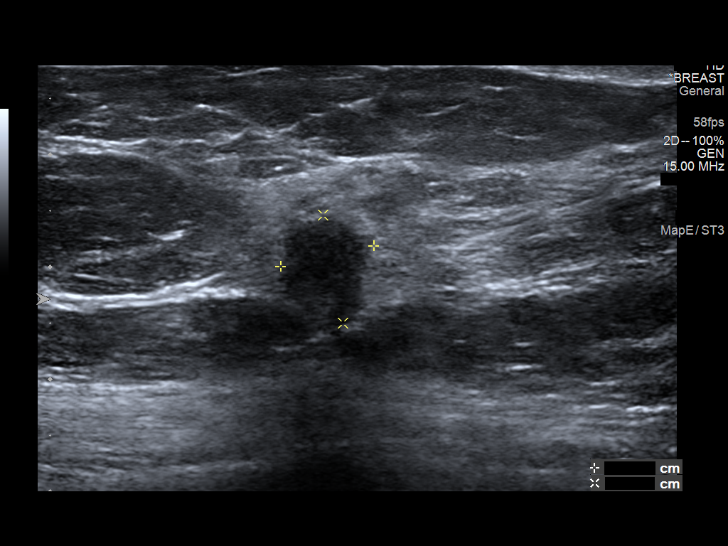
[im 3/11]
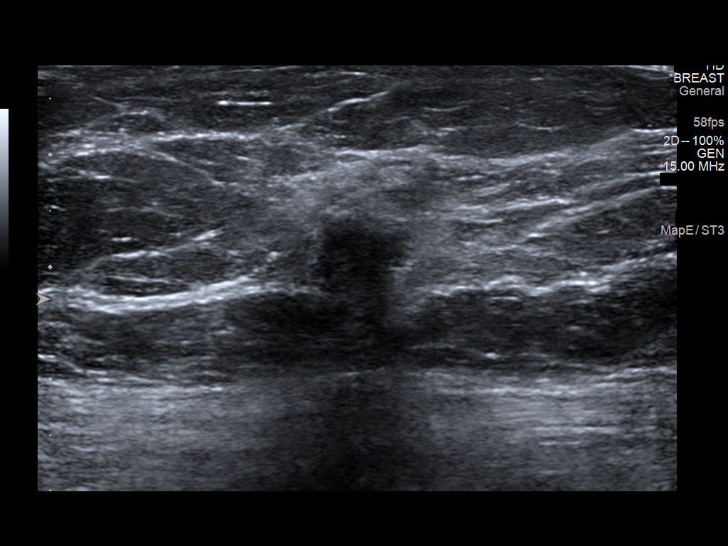
[im 4/11]
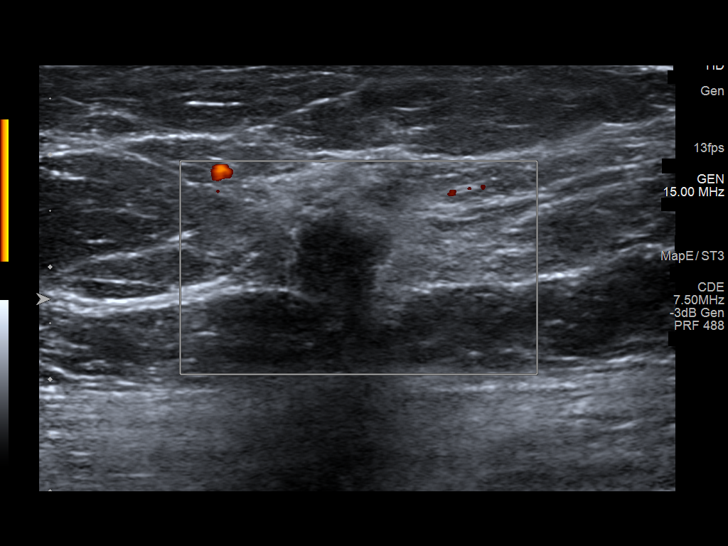
[im 5/11]
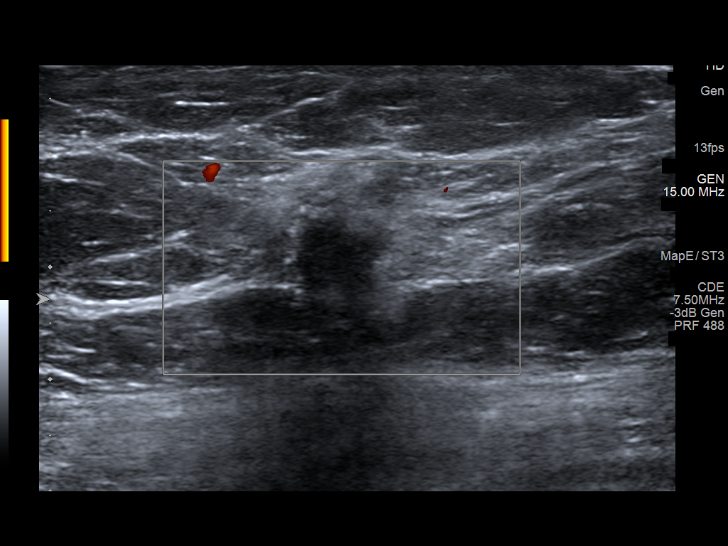
[im 6/11]
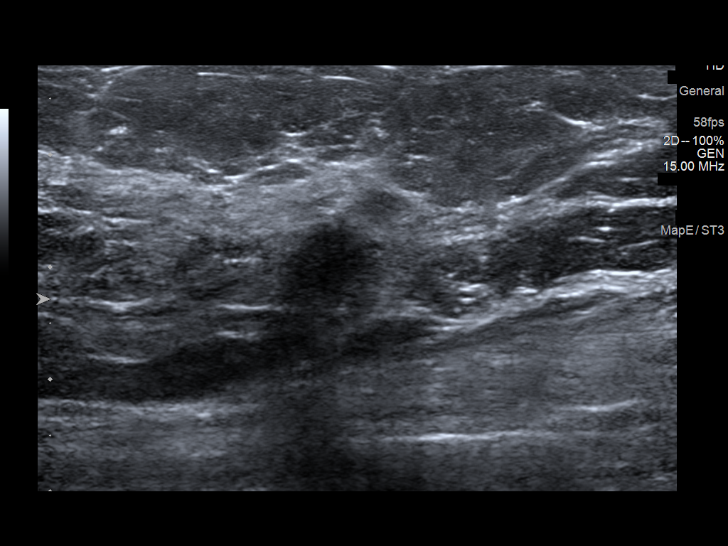
[im 7/11]
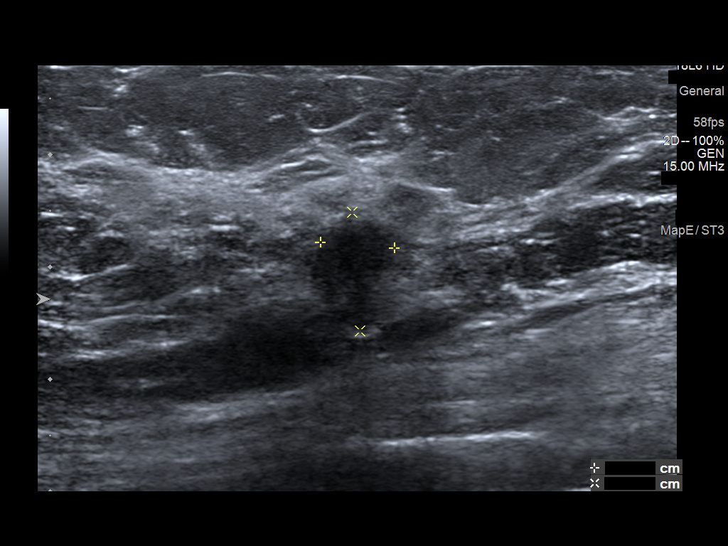
[im 8/11]
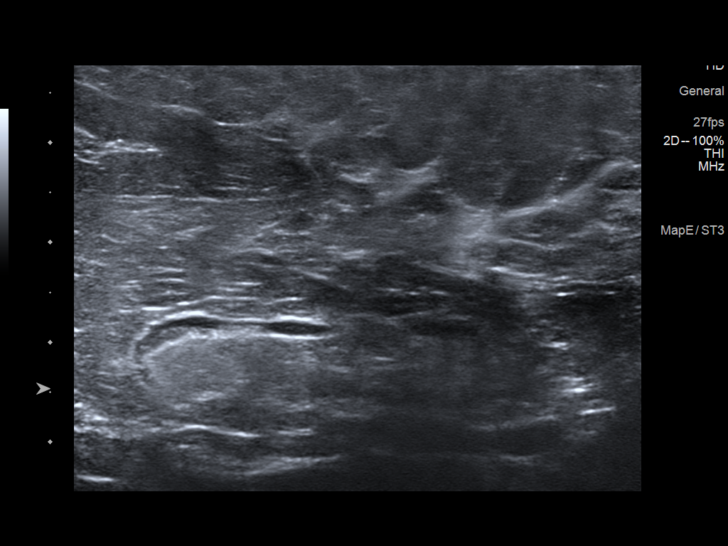
[im 9/11]
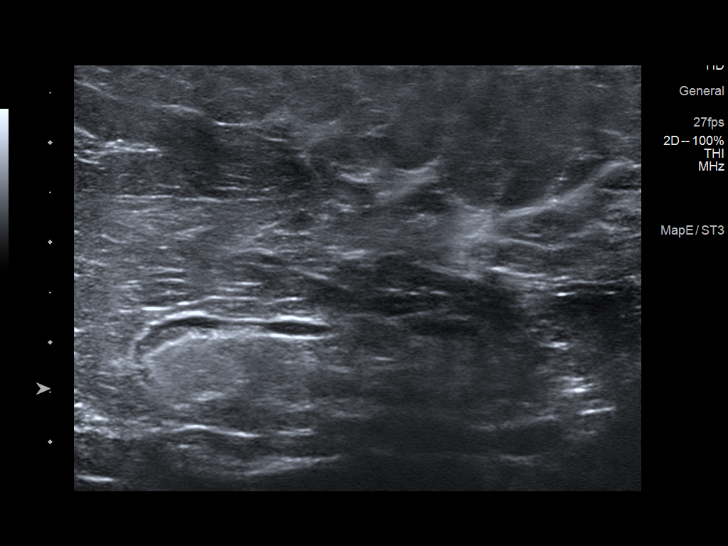
[im 10/11]
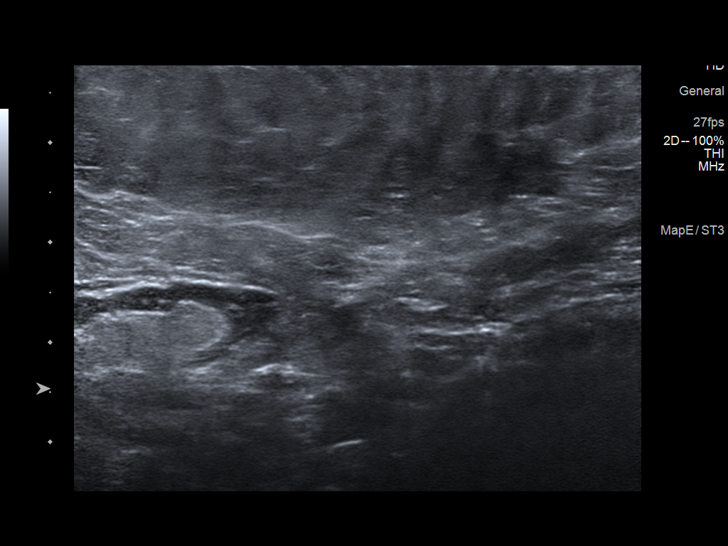
[im 11/11]
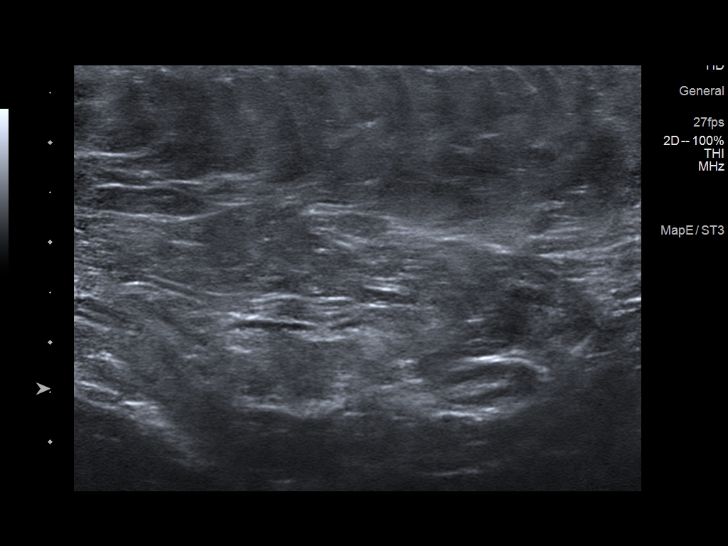

[11 of 11 positions shown; findings below may reference images not displayed]

ACR Breast Density Category b: There are scattered areas of
fibroglandular density.
FINDINGS: Spot-compression CC and MLO views of the area of concern were
obtained.

These confirm an irregular isoechoic mass in the outer breast at
middle depth measuring just over 1 cm, associated with architectural
distortion. There are no associated suspicious calcifications.

Targeted ultrasound is performed, demonstrating an irregular
hypoechoic mass with vague margins at the 9 o'clock position 4 cm
from nipple at middle depth measuring approximately 1.1 x 0.9 x
cm, demonstrating posterior acoustic shadowing and no internal power
Doppler flow, corresponding to the screening mammographic finding.

Sonographic evaluation of the RIGHT axilla demonstrates no
pathologic lymphadenopathy.
IMPRESSION: 1. Highly suspicious 1.1 cm mass involving the outer RIGHT breast at
the 9 o'clock position 4 cm from nipple.
2. No pathologic RIGHT axillary lymphadenopathy.

RECOMMENDATION:
Ultrasound-guided core needle biopsy of the RIGHT breast mass.

I have discussed the findings and recommendations with the patient.
The ultrasound core needle biopsy procedure was discussed with the
patient and her questions were answered. She wishes to proceed and
the biopsy has been scheduled by the Breast Imaging staff.

BI-RADS CATEGORY  5: Highly suggestive of malignancy.

## 2022-10-11 ENCOUNTER — Other Ambulatory Visit: Payer: Self-pay | Admitting: Hematology and Oncology

## 2022-10-11 DIAGNOSIS — Z170 Estrogen receptor positive status [ER+]: Secondary | ICD-10-CM

## 2022-10-11 DIAGNOSIS — Z86000 Personal history of in-situ neoplasm of breast: Secondary | ICD-10-CM

## 2022-10-12 ENCOUNTER — Ambulatory Visit: Payer: Medicare Other | Attending: Surgery | Admitting: Physical Therapy

## 2022-10-12 VITALS — Wt 194.1 lb

## 2022-10-12 DIAGNOSIS — Z170 Estrogen receptor positive status [ER+]: Secondary | ICD-10-CM | POA: Insufficient documentation

## 2022-10-12 DIAGNOSIS — C50411 Malignant neoplasm of upper-outer quadrant of right female breast: Secondary | ICD-10-CM

## 2022-10-12 NOTE — Therapy (Signed)
  OUTPATIENT PHYSICAL THERAPY SOZO SCREENING NOTE   Patient Name: Claire Bernard MRN: [id number] DOB:15-Sep-1950, 72 y.o., female Today's Date: 10/12/2022  PCP: Leonard Downing, MD REFERRING PROVIDER: Coralie Keens, MD   PT End of Session - 10/12/22 1250     Visit Number 7   unchanged due to screen only   PT Start Time 8299    PT Stop Time 1310    PT Time Calculation (min) 21 min    Activity Tolerance Patient tolerated treatment well    Behavior During Therapy [facility] for tasks assessed/performed             Past Medical History:  Diagnosis Date   Cancer ([place]) 07/2004   breast cancer   Chest pressure    Clotting disorder ([place]) 1970's   from birthcontrol    Esophageal spasm    Fatigue    GERD (gastroesophageal reflux disease)    History of dizziness    Hyperlipidemia    Hypertension    Laryngitis    Low bone mass    Personal history of radiation therapy 07/2004   left breast   Pre-diabetes    Past Surgical History:  Procedure Laterality Date   ANKLE FRACTURE SURGERY     BREAST LUMPECTOMY  07/17/2004   left breast   BREAST LUMPECTOMY WITH RADIOACTIVE SEED AND SENTINEL LYMPH NODE BIOPSY Right 12/15/2021   Procedure: RIGHT BREAST LUMPECTOMY WITH RADIOACTIVE SEED AND SENTINEL LYMPH NODE BIOPSY;  Surgeon: Coralie Keens, MD;  Location: [place];  Service: General;  Laterality: Right;   cataract surg     KNEE SURGERY     Arthroscopic   SHOULDER SURGERY     VAGINAL HYSTERECTOMY     Patient Active Problem List   Diagnosis Date Noted   Vaginal atrophy 12/08/2021   History of osteopenia 12/08/2021   Genetic testing 12/04/2021   Malignant neoplasm of upper-outer quadrant of right breast in female, estrogen receptor positive ([place]) 11/24/2021   History of ductal carcinoma in situ (DCIS) of breast 10/04/2019   Low bone mass    Hypertension    Chest pressure    Esophageal spasm     REFERRING DIAG: right breast cancer at risk for  lymphedema  THERAPY DIAG:  Malignant neoplasm of upper-outer quadrant of right breast in female, estrogen receptor positive ([place])  PERTINENT HISTORY: Patient was diagnosed on 10/30/2021 with right grade II invasive ductal carcinoma breast cancer. She underwent a right lumpectomy and sentinel node biopsy (1 negative node) on 12/15/2021 It is ER/PR positive and HER2 negative with a Ki67 of 40%. She had left breast cancer in 2005 which was treated with surgery, radiation, and anti-estrogen therapy. No axillary nodes removed per her report.   PRECAUTIONS: right UE Lymphedema risk, None  SUBJECTIVE: Pt reports she is here for her 3 month screen.   PAIN:  Are you having pain? No  SOZO SCREENING: Patient was assessed today using the SOZO machine to determine the lymphedema index score. This was compared to her baseline score. It was determined that she is NOT within the recommended range when compared to her baseline and so she was fitted for a compression garment while in the clinic today. It is recommended she return in 1 month to be reassessed. If she continues to measure outside the recommended range, physical therapy treatment will be recommended at that time and a referral requested.    [facility] [place], PT 10/12/2022, 1:10 PM

## 2022-11-01 ENCOUNTER — Other Ambulatory Visit: Payer: Self-pay | Admitting: Obstetrics and Gynecology

## 2022-11-01 DIAGNOSIS — Z79890 Hormone replacement therapy (postmenopausal): Secondary | ICD-10-CM

## 2022-11-02 ENCOUNTER — Ambulatory Visit
Admission: RE | Admit: 2022-11-02 | Discharge: 2022-11-02 | Disposition: A | Discharge status: Home / Self Care | Payer: Medicare Other | Source: Ambulatory Visit | Attending: Adult Health | Admitting: Adult Health

## 2022-11-02 ENCOUNTER — Other Ambulatory Visit: Payer: Self-pay | Admitting: Adult Health

## 2022-11-02 DIAGNOSIS — Z170 Estrogen receptor positive status [ER+]: Secondary | ICD-10-CM

## 2022-11-02 NOTE — Telephone Encounter (Signed)
AEX 12/08/21. Scheduled AEX 12/15/22.

## 2022-11-08 NOTE — Therapy (Signed)
  OUTPATIENT PHYSICAL THERAPY [facility] SCREENING NOTE   Patient Name: Claire Bernard MRN: [id number] DOB:17-May-1951, 72 y.o., female Today's Date: 11/09/2022  PCP: Leonard Downing, MD REFERRING PROVIDER: Coralie Keens, MD   PT End of Session - 11/09/22 1506     Visit Number 7   screen only   PT Start Time 1502    PT Stop Time 1506    PT Time Calculation (min) 4 min    Activity Tolerance Patient tolerated treatment well    Behavior During Therapy [facility] for tasks assessed/performed              Past Medical History:  Diagnosis Date   Cancer ([place]) 07/2004   breast cancer   Chest pressure    Clotting disorder ([place]) 1970's   from birthcontrol    Esophageal spasm    Fatigue    GERD (gastroesophageal reflux disease)    History of dizziness    Hyperlipidemia    Hypertension    Laryngitis    Low bone mass    Personal history of radiation therapy 07/2004   left breast   Pre-diabetes    Past Surgical History:  Procedure Laterality Date   ANKLE FRACTURE SURGERY     BREAST LUMPECTOMY Left 07/17/2004   left breast   BREAST LUMPECTOMY Right 11/2021   BREAST LUMPECTOMY WITH RADIOACTIVE SEED AND SENTINEL LYMPH NODE BIOPSY Right 12/15/2021   Procedure: RIGHT BREAST LUMPECTOMY WITH RADIOACTIVE SEED AND SENTINEL LYMPH NODE BIOPSY;  Surgeon: Coralie Keens, MD;  Location: [facility];  Service: General;  Laterality: Right;   cataract surg     KNEE SURGERY     Arthroscopic   SHOULDER SURGERY     VAGINAL HYSTERECTOMY     Patient Active Problem List   Diagnosis Date Noted   Vaginal atrophy 12/08/2021   History of osteopenia 12/08/2021   Genetic testing 12/04/2021   Malignant neoplasm of upper-outer quadrant of right breast in female, estrogen receptor positive ([place]) 11/24/2021   History of ductal carcinoma in situ (DCIS) of breast 10/04/2019   Low bone mass    Hypertension    Chest pressure    Esophageal spasm     REFERRING DIAG: right breast  cancer at risk for lymphedema  THERAPY DIAG:  Malignant neoplasm of upper-outer quadrant of right breast in female, estrogen receptor positive ([place])  Aftercare following surgery for neoplasm  PERTINENT HISTORY: Patient was diagnosed on 10/30/2021 with right grade II invasive ductal carcinoma breast cancer. She underwent a right lumpectomy and sentinel node biopsy (1 negative node) on 12/15/2021 It is ER/PR positive and HER2 negative with a Ki67 of 40%. She had left breast cancer in 2005 which was treated with surgery, radiation, and anti-estrogen therapy. No axillary nodes removed per her report.   PRECAUTIONS: right UE Lymphedema risk, None  SUBJECTIVE: Pt reports she is here for her 3 month screen.   PAIN:  Are you having pain? No  [facility] SCREENING: Here after wearing sleeve x 1 month.  Now WNL will recheck in 2 months.    Stark Bray, PT 11/09/2022, 3:07 PM

## 2022-11-09 ENCOUNTER — Ambulatory Visit: Payer: Medicare Other | Attending: Surgery | Admitting: Rehabilitation

## 2022-11-09 DIAGNOSIS — C50411 Malignant neoplasm of upper-outer quadrant of right female breast: Secondary | ICD-10-CM | POA: Insufficient documentation

## 2022-11-09 DIAGNOSIS — Z483 Aftercare following surgery for neoplasm: Secondary | ICD-10-CM | POA: Insufficient documentation

## 2022-11-09 DIAGNOSIS — Z170 Estrogen receptor positive status [ER+]: Secondary | ICD-10-CM | POA: Insufficient documentation

## 2022-11-17 ENCOUNTER — Other Ambulatory Visit: Payer: Self-pay | Admitting: Obstetrics and Gynecology

## 2022-11-17 ENCOUNTER — Ambulatory Visit (INDEPENDENT_AMBULATORY_CARE_PROVIDER_SITE_OTHER): Payer: Medicare Other

## 2022-11-17 DIAGNOSIS — Z13820 Encounter for screening for osteoporosis: Secondary | ICD-10-CM | POA: Diagnosis not present

## 2022-11-17 DIAGNOSIS — M8589 Other specified disorders of bone density and structure, multiple sites: Secondary | ICD-10-CM

## 2022-11-17 DIAGNOSIS — Z780 Asymptomatic menopausal state: Secondary | ICD-10-CM

## 2022-11-17 DIAGNOSIS — Z8739 Personal history of other diseases of the musculoskeletal system and connective tissue: Secondary | ICD-10-CM

## 2022-12-01 NOTE — Progress Notes (Signed)
72 y.o. G57P2002 Married White or Caucasian Not Hispanic or Latino female here for annual exam.  H/O hysterectomy.   H/O right breast cancer, diagnosed in 2023. S/P lumpectomy in 4/23. She is now on arimidex.  She is on vaginal estrogen. She is sexually active, is using lubricant, no dyspareunia.   Some issues with loose stools.     No LMP recorded. Patient has had a hysterectomy.          Sexually active: Yes.    The current method of family planning is status post hysterectomy.    Exercising: No.  The patient does not participate in regular exercise at present. Smoker:  no  Health Maintenance: Pap:   10/04/19 WNL, 12/17/10  WNL  History of abnormal Pap:  no MMG:  11/02/22 Bi-rads 2 benign  BMD:   11/17/22 osteopenia, T score -1.4, FRAX 9/1.1% (stable) Colonoscopy: [facility] w/ PCP yearly TDaP: PCP  Gardasil: n/a   reports that she quit smoking about 32 years ago. Her smoking use included cigarettes. She has a 10.00 pack-year smoking history. She has never used smokeless tobacco. She reports current alcohol use. She reports that she does not use drugs.  Past Medical History:  Diagnosis Date   Cancer 07/2004   breast cancer   Chest pressure    Clotting disorder 1970's   from birthcontrol    Esophageal spasm    Fatigue    GERD (gastroesophageal reflux disease)    History of dizziness    Hyperlipidemia    Hypertension    Laryngitis    Low bone mass    Personal history of radiation therapy 07/2004   left breast   Pre-diabetes     Past Surgical History:  Procedure Laterality Date   ANKLE FRACTURE SURGERY     BREAST LUMPECTOMY Left 07/17/2004   left breast   BREAST LUMPECTOMY Right 11/2021   BREAST LUMPECTOMY WITH RADIOACTIVE SEED AND SENTINEL LYMPH NODE BIOPSY Right 12/15/2021   Procedure: RIGHT BREAST LUMPECTOMY WITH RADIOACTIVE SEED AND SENTINEL LYMPH NODE BIOPSY;  Surgeon: Abigail Miyamoto, MD;  Location: [facility];  Service: General;  Laterality: Right;    cataract surg     KNEE SURGERY     Arthroscopic   SHOULDER SURGERY     VAGINAL HYSTERECTOMY      Current Outpatient Medications  Medication Sig Dispense Refill   anastrozole (ARIMIDEX) 1 MG tablet TAKE 1 TABLET BY MOUTH DAILY 100 tablet 2   busPIRone (BUSPAR) 5 MG tablet Take 5 mg by mouth 2 (two) times daily.     carboxymethylcellulose (REFRESH TEARS) 0.5 % SOLN Place 1 drop into both eyes daily as needed (dry eyes).     cetirizine (ZYRTEC) 10 MG tablet Take 10 mg by mouth daily.     Cholecalciferol (VITAMIN D-3) 125 MCG (5000 UT) TABS Take 5,000 mg by mouth every morning.     Chromium 200 MCG TABS Take 200 mcg by mouth 3 (three) times daily after meals, bedtime and 0200.     estradiol (ESTRACE) 0.1 MG/GM vaginal cream INSERT ONE-HALF GRAM INTRAVAGINALLY TWICE A WEEK AT BEDTIME 43 g 0   hydrochlorothiazide (HYDRODIURIL) 25 MG tablet Take 25 mg by mouth every morning.     lisinopril (ZESTRIL) 20 MG tablet Take 20 mg by mouth at bedtime.     naproxen (NAPROSYN) 500 MG tablet Take 500 mg by mouth every 12 (twelve) hours.     traZODone (DESYREL) 150 MG tablet Take 75 mg by mouth at  bedtime. 2100     cyanocobalamin (VITAMIN B12) 1000 MCG tablet Take 1,000 mcg by mouth every morning.     Magnesium Citrate 100 MG CAPS Take 100 mg by mouth daily.     No current facility-administered medications for this visit.    Family History  Problem Relation Age of Onset   Atrial fibrillation Father    Emphysema Father    Heart attack Father        multiple   Heart disease Father    Hypertension Sister    Stroke Sister        x6 strokes is 62 y/o   Asthma Sister    Diabetes Sister    Depression Brother    Heart disease Brother    Coronary artery disease Brother        x3 CABG   Atrial fibrillation Maternal Grandmother    Diabetes Maternal Grandmother    Heart disease Maternal Grandmother    Hypertension Maternal Grandmother    Atrial fibrillation Maternal Grandfather    Heart disease  Maternal Grandfather    Hypertension Maternal Grandfather    Breast cancer Paternal Grandmother        dx. 37s   Cancer Paternal Grandmother        uterine or ovarian, unsure if breast cancer metastasis or second primary   Heart disease Paternal Grandfather    Hypertension Paternal Grandfather    Atrial fibrillation Paternal Grandfather    Breast cancer Paternal Aunt        dx. 46s   Colon cancer Paternal Aunt    Colon cancer Paternal Uncle        dx. 50s   Lung cancer Paternal Uncle     Review of Systems  All other systems reviewed and are negative.   Exam:   BP 128/74   Pulse 65   Ht 5' 4.57" (1.64 m)   Wt 193 lb (87.5 kg)   SpO2 100%   BMI 32.55 kg/m   Weight change: @ Height:   Height: 5' 4.57" (164 cm)  Ht Readings from Last 3 Encounters:  12/15/22 5' 4.57" (1.64 m)  08/28/22  (1.651 m)  07/13/22  (1.651 m)    General appearance: alert, cooperative and appears stated age Head: Normocephalic, without obvious abnormality, atraumatic Neck: no adenopathy, supple, symmetrical, trachea midline and thyroid normal to inspection and palpation Lungs: clear to auscultation bilaterally Cardiovascular: regular rate and rhythm Breasts: normal appearance, no masses or tenderness Abdomen: soft, non-tender; non distended,  no masses,  no organomegaly Extremities: extremities normal, atraumatic, no cyanosis or edema Skin: Skin color, texture, turgor normal. No rashes or lesions Lymph nodes: Cervical, supraclavicular, and axillary nodes normal. No abnormal inguinal nodes palpated Neurologic: Grossly normal   Pelvic: External genitalia:  no lesions              Urethra:  normal appearing urethra with no masses, tenderness or lesions              Bartholins and Skenes: normal                 Vagina: normal appearing vagina with normal color and discharge, no lesions              Cervix: absent               Bimanual Exam:  Uterus:  uterus absent               Adnexa: no mass,  fullness, tenderness               Rectovaginal: Confirms               Anus:  normal sphincter tone, no lesions  Carolynn Serve, CMA chaperoned for the exam.  1. GYN exam for high-risk Medicare patient Discussed breast self exam Labs with primary Mammogram UTD Colon cancer screening with her primary No pap needed  2. History of osteopenia DEXA stable Discussed calcium and vit D intake  3. Malignant neoplasm of upper-outer quadrant of right breast in female, estrogen receptor positive On Arimidex, general recommendation is not to use vaginal estrogen while on Arimidex, it may increase her risk of breast cancer recurrence.   4. Vaginal atrophy Recommended she go off of the vaginal estrogen since she is on Arimidex. Studies have shown that she could have an increased risk of breast cancer recurrence with vaginal estrogen while on Arimidex   In addition to the breast and pelvic exam over 20 minutes was spent in patient care, including recommendation to stop vaginal estrogen.

## 2022-12-15 ENCOUNTER — Encounter: Payer: Self-pay | Admitting: Obstetrics and Gynecology

## 2022-12-15 ENCOUNTER — Ambulatory Visit (INDEPENDENT_AMBULATORY_CARE_PROVIDER_SITE_OTHER): Payer: Medicare Other | Admitting: Obstetrics and Gynecology

## 2022-12-15 VITALS — BP 128/74 | HR 65 | Ht 64.57 in | Wt 193.0 lb

## 2022-12-15 DIAGNOSIS — Z170 Estrogen receptor positive status [ER+]: Secondary | ICD-10-CM | POA: Diagnosis not present

## 2022-12-15 DIAGNOSIS — N952 Postmenopausal atrophic vaginitis: Secondary | ICD-10-CM | POA: Diagnosis not present

## 2022-12-15 DIAGNOSIS — Z9189 Other specified personal risk factors, not elsewhere classified: Secondary | ICD-10-CM | POA: Diagnosis not present

## 2022-12-15 DIAGNOSIS — C50411 Malignant neoplasm of upper-outer quadrant of right female breast: Secondary | ICD-10-CM

## 2022-12-15 DIAGNOSIS — Z8739 Personal history of other diseases of the musculoskeletal system and connective tissue: Secondary | ICD-10-CM | POA: Diagnosis not present

## 2022-12-15 NOTE — Patient Instructions (Signed)

## 2022-12-30 ENCOUNTER — Encounter: Payer: Self-pay | Admitting: Rehabilitation

## 2022-12-30 ENCOUNTER — Ambulatory Visit: Payer: Medicare Other | Attending: Surgery | Admitting: Rehabilitation

## 2022-12-30 DIAGNOSIS — C50411 Malignant neoplasm of upper-outer quadrant of right female breast: Secondary | ICD-10-CM | POA: Insufficient documentation

## 2022-12-30 DIAGNOSIS — Z170 Estrogen receptor positive status [ER+]: Secondary | ICD-10-CM | POA: Insufficient documentation

## 2022-12-30 DIAGNOSIS — Z483 Aftercare following surgery for neoplasm: Secondary | ICD-10-CM

## 2022-12-30 NOTE — Therapy (Signed)
  OUTPATIENT PHYSICAL THERAPY [facility] SCREENING NOTE   Patient Name: Claire Bernard MRN: [id number] DOB:[birth year], 72 y.o., female Today's Date: 12/30/2022  PCP: Kaleen Mask, MD REFERRING PROVIDER: Abigail Miyamoto, MD   PT End of Session - 12/30/22 1307     Visit Number 7   screen only   PT Start Time 1300    PT Stop Time 1306    PT Time Calculation (min) 6 min    Activity Tolerance Patient tolerated treatment well    Behavior During Therapy [facility] for tasks assessed/performed              Past Medical History:  Diagnosis Date   Cancer ([facility]) 07/2004   breast cancer   Chest pressure    Clotting disorder ([facility]) 1970's   from birthcontrol    Esophageal spasm    Fatigue    GERD (gastroesophageal reflux disease)    History of dizziness    Hyperlipidemia    Hypertension    Laryngitis    Low bone mass    Personal history of radiation therapy 07/2004   left breast   Pre-diabetes    Past Surgical History:  Procedure Laterality Date   ANKLE FRACTURE SURGERY     BREAST LUMPECTOMY Left 07/17/2004   left breast   BREAST LUMPECTOMY Right 11/2021   BREAST LUMPECTOMY WITH RADIOACTIVE SEED AND SENTINEL LYMPH NODE BIOPSY Right 12/15/2021   Procedure: RIGHT BREAST LUMPECTOMY WITH RADIOACTIVE SEED AND SENTINEL LYMPH NODE BIOPSY;  Surgeon: Abigail Miyamoto, MD;  Location: [facility];  Service: General;  Laterality: Right;   cataract surg     KNEE SURGERY     Arthroscopic   SHOULDER SURGERY     VAGINAL HYSTERECTOMY     Patient Active Problem List   Diagnosis Date Noted   Vaginal atrophy 12/08/2021   History of osteopenia 12/08/2021   Genetic testing 12/04/2021   Malignant neoplasm of upper-outer quadrant of right breast in female, estrogen receptor positive ([facility]) 11/24/2021   History of ductal carcinoma in situ (DCIS) of breast 10/04/2019   Low bone mass    Hypertension    Esophageal spasm     REFERRING DIAG: right breast cancer at risk for  lymphedema  THERAPY DIAG:  Malignant neoplasm of upper-outer quadrant of right breast in female, estrogen receptor positive ([facility])  Aftercare following surgery for neoplasm  PERTINENT HISTORY: Patient was diagnosed on 10/30/2021 with right grade II invasive ductal carcinoma breast cancer. She underwent a right lumpectomy and sentinel node biopsy (1 negative node) on 12/15/2021 It is ER/PR positive and HER2 negative with a Ki67 of 40%. She had left breast cancer in 2005 which was treated with surgery, radiation, and anti-estrogen therapy. No axillary nodes removed per her report.   PRECAUTIONS: right UE Lymphedema risk, None  SUBJECTIVE: Pt reports she is here for her 3 month screen.   PAIN:  Are you having pain? No  [facility] SCREENING: Now back to almost baseline. Pt will wear sleeve PRN espeically with arm heavy activities or exercise.   Idamae Lusher, PT 12/30/2022, 1:08 PM

## 2023-03-11 ENCOUNTER — Encounter: Payer: Self-pay | Admitting: Hematology and Oncology

## 2023-03-11 ENCOUNTER — Other Ambulatory Visit: Payer: Self-pay

## 2023-03-11 ENCOUNTER — Inpatient Hospital Stay: Payer: Medicare Other | Attending: Hematology and Oncology | Admitting: Hematology and Oncology

## 2023-03-11 VITALS — BP 126/70 | HR 82 | Temp 98.1°F | Resp 18 | Wt 194.5 lb

## 2023-03-11 DIAGNOSIS — Z800 Family history of malignant neoplasm of digestive organs: Secondary | ICD-10-CM | POA: Diagnosis not present

## 2023-03-11 DIAGNOSIS — Z170 Estrogen receptor positive status [ER+]: Secondary | ICD-10-CM | POA: Diagnosis not present

## 2023-03-11 DIAGNOSIS — Z79811 Long term (current) use of aromatase inhibitors: Secondary | ICD-10-CM | POA: Insufficient documentation

## 2023-03-11 DIAGNOSIS — Z923 Personal history of irradiation: Secondary | ICD-10-CM | POA: Diagnosis not present

## 2023-03-11 DIAGNOSIS — Z801 Family history of malignant neoplasm of trachea, bronchus and lung: Secondary | ICD-10-CM | POA: Insufficient documentation

## 2023-03-11 DIAGNOSIS — Z79899 Other long term (current) drug therapy: Secondary | ICD-10-CM | POA: Diagnosis not present

## 2023-03-11 DIAGNOSIS — Z803 Family history of malignant neoplasm of breast: Secondary | ICD-10-CM | POA: Insufficient documentation

## 2023-03-11 DIAGNOSIS — C50411 Malignant neoplasm of upper-outer quadrant of right female breast: Secondary | ICD-10-CM | POA: Insufficient documentation

## 2023-03-11 NOTE — Progress Notes (Signed)
BRIEF ONCOLOGIC HISTORY:  Oncology History  Malignant neoplasm of upper-outer quadrant of right breast in female, estrogen receptor positive (HCC)  10/10/2021 Imaging   Screening mammogram showed a possible mass in the right breast.  Diagnostic mammogram confirmed an irregular isoechoic mass in the outer breast at the middle depth measuring just over 1 cm associated with architectural distortion.  No associated suspicious calcifications.  Targeted ultrasound was performed, demonstrating an irregular hypoechoic mass with vague margins at the 9 o'clock position 4 cm from nipple at middle depth measuring approximately 1.1 x 0.9 x 1 cm demonstrating posterior acoustic shadowing and no internal power Doppler flow corresponding to the screening mammographic finding.  Right axilla demonstrated no pathologic lymphadenopathy. Ultrasound of the area showed highly suspicious 1.1 cm mass involving the outer right breast at 9 o'clock position 4 cm from the nipple.  No pathologic right axillary lymphadenopathy.    11/18/2021 Pathology Results   Right breast needle core biopsy 9 o'clock position showed invasive carcinoma of no special type, grade 2, 11 mm in greatest length, ER 100% positive strong staining intensity, PR 100% positive strong staining intensity, Ki-67 of 40% and HER2 negative by IHC.    Genetic Testing   [facility] CancerNext-Expanded Panel was Negative. 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 Report date is 12/15/2021.  The CancerNext-Expanded gene panel offered by [facility] and includes sequencing, rearrangement, and RNA analysis for the following 77 genes: AIP, ALK, APC, ATM, AXIN2, BAP1, BARD1, BLM, BMPR1A, BRCA1, BRCA2, BRIP1, CDC73, CDH1, CDK4, CDKN1B, CDKN2A, CHEK2, CTNNA1, DICER1, FANCC, FH, FLCN, GALNT12, KIF1B, LZTR1, MAX, MEN1, MET, MLH1, MSH2, MSH3, MSH6, MUTYH, NBN, NF1, NF2, NTHL1, PALB2, PHOX2B, PMS2, POT1, PRKAR1A, PTCH1, PTEN, RAD51C, RAD51D,  RB1, RECQL, RET, SDHA, SDHAF2, SDHB, SDHC, SDHD, SMAD4, SMARCA4, SMARCB1, SMARCE1, STK11, SUFU, TMEM127, TP53, TSC1, TSC2, VHL and XRCC2 (sequencing and deletion/duplication); EGFR, EGLN1, HOXB13, KIT, MITF, PDGFRA, POLD1, and POLE (sequencing only); EPCAM and GREM1 (deletion/duplication only).    12/15/2021 Surgery   She had right breast lumpectomy with sentinel lymph node biopsy.  Pathology showed invasive ductal carcinoma, 1.7 cm, grade 3 along with high-grade DCIS, resection margins are negative for carcinoma sentinel lymph node negative.  Previous prognostic showed ER 100% positive strong staining, PR 100% positive strong staining, KI however at 40%, HER2 negative.    12/29/2021 Oncotype testing   Oncotype at 15, distant recurrence risk at 9 years of 4% and absolute chemotherapy benefit less than 1%.   01/28/2022 - 02/24/2022 Radiation Therapy   Site Technique Total Dose (Gy) Dose per Fx (Gy) Completed Fx Beam Energies  Breast, Right: Breast_R 3D 40.05/40.05 2.67 15/15 10X  Breast, Right: Breast_R_Bst 3D 10/10 2 5/5 6X     02/2022 -  Anti-estrogen oral therapy   Anastrozole     INTERVAL HISTORY:  Ms Tsion is here for a follow up. Since her last visit here she has been doing well. She is compliant with all her medications. She denies any changes in her breasts No change in breathing or bowel habits or urinary habits. No new neurological complaints. Rest of the pertinent 10 point ROS reviewed and neg.   REVIEW OF SYSTEMS:  Review of Systems  Constitutional:  Negative for appetite change, chills, fatigue, fever and unexpected weight change.  HENT:   Negative for hearing loss, lump/mass and trouble swallowing.   Eyes:  Negative for eye problems and icterus.  Respiratory:  Negative for chest tightness, cough and shortness  of breath.   Cardiovascular:  Negative for chest pain, leg swelling and palpitations.  Gastrointestinal:  Negative for abdominal distention, abdominal pain,  constipation, diarrhea, nausea and vomiting.  Endocrine: Negative for hot flashes.  Genitourinary:  Negative for difficulty urinating.   Musculoskeletal:  Negative for arthralgias.  Skin:  Negative for itching and rash.  Neurological:  Negative for dizziness, extremity weakness, headaches and numbness.  Hematological:  Negative for adenopathy. Does not bruise/bleed easily.  Psychiatric/Behavioral:  Negative for depression. The patient is not nervous/anxious.   Breast: Denies any new nodularity, masses, tenderness, nipple changes, or nipple discharge.    PAST MEDICAL/SURGICAL HISTORY:  Past Medical History:  Diagnosis Date   Cancer (HCC) 07/2004   breast cancer   Chest pressure    Clotting disorder (HCC) 1970's   from birthcontrol    Esophageal spasm    Fatigue    GERD (gastroesophageal reflux disease)    History of dizziness    Hyperlipidemia    Hypertension    Laryngitis    Low bone mass    Personal history of radiation therapy 07/2004   left breast   Pre-diabetes    Past Surgical History:  Procedure Laterality Date   ANKLE FRACTURE SURGERY     BREAST LUMPECTOMY Left 07/17/2004   left breast   BREAST LUMPECTOMY Right 11/2021   BREAST LUMPECTOMY WITH RADIOACTIVE SEED AND SENTINEL LYMPH NODE BIOPSY Right 12/15/2021   Procedure: RIGHT BREAST LUMPECTOMY WITH RADIOACTIVE SEED AND SENTINEL LYMPH NODE BIOPSY;  Surgeon: Abigail Miyamoto, MD;  Location: [facility];  Service: General;  Laterality: Right;   cataract surg     KNEE SURGERY     Arthroscopic   SHOULDER SURGERY     VAGINAL HYSTERECTOMY       ALLERGIES:  No Known Allergies   CURRENT MEDICATIONS:  Outpatient Encounter Medications as of 03/11/2023  Medication Sig   anastrozole (ARIMIDEX) 1 MG tablet TAKE 1 TABLET BY MOUTH DAILY   busPIRone (BUSPAR) 5 MG tablet Take 5 mg by mouth 2 (two) times daily.   carboxymethylcellulose (REFRESH TEARS) 0.5 % SOLN Place 1 drop into both eyes daily as needed  (dry eyes).   cetirizine (ZYRTEC) 10 MG tablet Take 10 mg by mouth daily.   Cholecalciferol (VITAMIN D-3) 125 MCG (5000 UT) TABS Take 5,000 mg by mouth every morning.   Chromium 200 MCG TABS Take 200 mcg by mouth 3 (three) times daily after meals, bedtime and 0200.   cyanocobalamin (VITAMIN B12) 1000 MCG tablet Take 1,000 mcg by mouth every morning.   hydrochlorothiazide (HYDRODIURIL) 25 MG tablet Take 25 mg by mouth every morning.   lisinopril (ZESTRIL) 20 MG tablet Take 20 mg by mouth at bedtime.   Magnesium Citrate 100 MG CAPS Take 100 mg by mouth daily.   naproxen (NAPROSYN) 500 MG tablet Take 500 mg by mouth every 12 (twelve) hours.   traZODone (DESYREL) 150 MG tablet Take 75 mg by mouth at bedtime. 2100   No facility-administered encounter medications on file as of 03/11/2023.     ONCOLOGIC FAMILY HISTORY:  Family History  Problem Relation Age of Onset   Atrial fibrillation Father    Emphysema Father    Heart attack Father        multiple   Heart disease Father    Hypertension Sister    Stroke Sister        x6 strokes is 52 y/o   Asthma Sister    Diabetes Sister  Depression Brother    Heart disease Brother    Coronary artery disease Brother        x3 CABG   Atrial fibrillation Maternal Grandmother    Diabetes Maternal Grandmother    Heart disease Maternal Grandmother    Hypertension Maternal Grandmother    Atrial fibrillation Maternal Grandfather    Heart disease Maternal Grandfather    Hypertension Maternal Grandfather    Breast cancer Paternal Grandmother        dx. 42s   Cancer Paternal Grandmother        uterine or ovarian, unsure if breast cancer metastasis or second primary   Heart disease Paternal Grandfather    Hypertension Paternal Grandfather    Atrial fibrillation Paternal Grandfather    Breast cancer Paternal Aunt        dx. 56s   Colon cancer Paternal Aunt    Colon cancer Paternal Uncle        dx. 50s   Lung cancer Paternal Uncle      SOCIAL  HISTORY:  Social History   Socioeconomic History   Marital status: Married    Spouse name: Not on file   Number of children: 2   Years of education: Not on file   Highest education level: Not on file  Occupational History    Employer: [employer]  Tobacco Use   Smoking status: Former    Current packs/day: 0.00    Average packs/day: 0.5 packs/day for 20.0 years (10.0 ttl pk-yrs)    Types: Cigarettes    Start date: 08/31/1970    Quit date: 08/31/1990    Years since quitting: 32.5   Smokeless tobacco: Never  Vaping Use   Vaping status: Never Used  Substance and Sexual Activity   Alcohol use: Yes    Alcohol/week: 0.0 standard drinks of alcohol    Comment: Rare   Drug use: No   Sexual activity: Yes    Birth control/protection: Surgical, Post-menopausal    Comment: 1st intercourse 72 yo-Fewer than 5 partners  Other Topics Concern   Not on file  Social History Narrative   Not on file   Social Determinants of Health   Financial Resource Strain: Low Risk  (11/26/2021)   Overall Financial Resource Strain (CARDIA)    Difficulty of Paying Living Expenses: Not hard at all  Food Insecurity: No Food Insecurity (11/26/2021)   Hunger Vital Sign    Worried About Running Out of Food in the Last Year: Never true    Ran Out of Food in the Last Year: Never true  Transportation Needs: No Transportation Needs (11/26/2021)   PRAPARE - Administrator, Civil Service (Medical): No    Lack of Transportation (Non-Medical): No  Physical Activity: Not on file  Stress: Not on file  Social Connections: Unknown (03/18/2022)   Received from [facility], [facility]   Social Network    Social Network: Not on file  Intimate Partner Violence: Unknown (03/18/2022)   Received from [facility], [facility]   HITS    Physically Hurt: Not on file    Insult or Talk Down To: Not on file    Threaten Physical Harm: Not on file    Scream or Curse: Not on file     OBSERVATIONS/OBJECTIVE:  BP  126/70 (BP Location: Left Arm, Patient Position: Sitting)   Pulse 82   Temp 98.1 F (36.7 C) (Temporal)   Resp 18   Wt 194 lb 8 oz (88.2 kg)   SpO2  98%   BMI 32.80 kg/m  GENERAL: Patient is a well appearing female in no acute distress HEENT:  Sclerae anicteric.  Oropharynx clear and moist. No ulcerations or evidence of oropharyngeal candidiasis. Neck is supple.  NODES:  No cervical, supraclavicular, or axillary lymphadenopathy palpated.  BREAST EXAM: Right breast status postlumpectomy and radiation no sign of local recurrence left breast status postlumpectomy radiation no sign of local recurrence bilateral breast exam is benign. LUNGS:  Clear to auscultation bilaterally.  No wheezes or rhonchi. HEART:  Regular rate and rhythm. No murmur appreciated. ABDOMEN:  Soft, nontender.  Positive, normoactive bowel sounds. No organomegaly palpated. MSK:  No focal spinal tenderness to palpation. Full range of motion bilaterally in the upper extremities. EXTREMITIES:  No peripheral edema.   SKIN:  Clear with no obvious rashes or skin changes. No nail dyscrasia. NEURO:  Nonfocal. Well oriented.  Appropriate affect.  LABORATORY DATA:  None for this visit.  DIAGNOSTIC IMAGING:  None for this visit.      ASSESSMENT AND PLAN:   Claire Bernard is a pleasant 72 y.o. female with Stage IA right breast invasive ductal carcinoma, ER+/PR+/HER2-, diagnosed in 10/2021, treated with lumpectomy, adjuvant radiation therapy, and anti-estrogen therapy with Anastrozole beginning in 02/2022.    She is compliant with anastrozole, no adverse effects reported.  Last mammogram with no evidence of malignancy.  No concerns on physical exam.  Most recent bone density with T-score of -1.4, mild improvement compared to 2021.  I have encouraged her to walk 30 minutes a day 5 days a week which amounts to 150 minutes of exercise every week.  She also continues on vitamin D and calcium supplementation as tolerated.  She would  like to continue follow-up with medical oncology.  She will return to clinic in 6 months.     The patient was provided an opportunity to ask questions and all were answered. The patient agreed with the plan and demonstrated an understanding of the instructions.   Total encounter time:101minutes*in face-to-face visit time, chart review, lab review, care coordination, order entry, and documentation of the encounter time.   *Total Encounter Time as defined by the Centers for Medicare and Medicaid Services includes, in addition to the face-to-face time of a patient visit (documented in the note above) non-face-to-face time: obtaining and reviewing outside history, ordering and reviewing medications, tests or procedures, care coordination (communications with other health care professionals or caregivers) and documentation in the medical record.

## 2023-03-29 ENCOUNTER — Encounter: Payer: Self-pay | Admitting: Family Medicine

## 2023-04-06 ENCOUNTER — Ambulatory Visit: Payer: Medicare Other | Attending: Surgery

## 2023-04-06 DIAGNOSIS — G8929 Other chronic pain: Secondary | ICD-10-CM | POA: Insufficient documentation

## 2023-04-06 DIAGNOSIS — R293 Abnormal posture: Secondary | ICD-10-CM | POA: Insufficient documentation

## 2023-04-06 DIAGNOSIS — Z170 Estrogen receptor positive status [ER+]: Secondary | ICD-10-CM | POA: Insufficient documentation

## 2023-04-06 DIAGNOSIS — M25511 Pain in right shoulder: Secondary | ICD-10-CM | POA: Insufficient documentation

## 2023-04-06 DIAGNOSIS — Z483 Aftercare following surgery for neoplasm: Secondary | ICD-10-CM | POA: Insufficient documentation

## 2023-04-06 DIAGNOSIS — C50411 Malignant neoplasm of upper-outer quadrant of right female breast: Secondary | ICD-10-CM | POA: Insufficient documentation

## 2023-04-06 DIAGNOSIS — M25611 Stiffness of right shoulder, not elsewhere classified: Secondary | ICD-10-CM | POA: Insufficient documentation

## 2023-04-06 NOTE — Therapy (Signed)
  OUTPATIENT PHYSICAL THERAPY SOZO SCREENING NOTE   Patient Name: Claire Bernard MRN: [id number] DOB:20-Apr-1951, 72 y.o., female Today's Date: 04/06/2023  PCP: Kaleen Mask, MD REFERRING PROVIDER: Abigail Miyamoto, MD    Past Medical History:  Diagnosis Date   Cancer [facility]) 07/2004   breast cancer   Chest pressure    Clotting disorder (HCC) 1970's   from birthcontrol    Esophageal spasm    Fatigue    GERD (gastroesophageal reflux disease)    History of dizziness    Hyperlipidemia    Hypertension    Laryngitis    Low bone mass    Personal history of radiation therapy 07/2004   left breast   Pre-diabetes    Past Surgical History:  Procedure Laterality Date   ANKLE FRACTURE SURGERY     BREAST LUMPECTOMY Left 07/17/2004   left breast   BREAST LUMPECTOMY Right 11/2021   BREAST LUMPECTOMY WITH RADIOACTIVE SEED AND SENTINEL LYMPH NODE BIOPSY Right 12/15/2021   Procedure: RIGHT BREAST LUMPECTOMY WITH RADIOACTIVE SEED AND SENTINEL LYMPH NODE BIOPSY;  Surgeon: Abigail Miyamoto, MD;  Location: [facility];  Service: General;  Laterality: Right;   cataract surg     KNEE SURGERY     Arthroscopic   SHOULDER SURGERY     VAGINAL HYSTERECTOMY     Patient Active Problem List   Diagnosis Date Noted   Vaginal atrophy 12/08/2021   History of osteopenia 12/08/2021   Genetic testing 12/04/2021   Malignant neoplasm of upper-outer quadrant of right breast in female, estrogen receptor positive (HCC) 11/24/2021   History of ductal carcinoma in situ (DCIS) of breast 10/04/2019   Low bone mass    Hypertension    Esophageal spasm     REFERRING DIAG: right breast cancer at risk for lymphedema  THERAPY DIAG:  No diagnosis found.  PERTINENT HISTORY:  Patient was diagnosed on 10/30/2021 with right grade II invasive ductal carcinoma breast cancer. She underwent a right lumpectomy and sentinel node biopsy (1 negative node) on 12/15/2021 It is ER/PR positive and HER2  negative with a Ki67 of 40%. She had left breast cancer in 2005 which was treated with surgery, radiation, and anti-estrogen therapy. No axillary nodes removed per her report.   PRECAUTIONS: right UE Lymphedema risk,   SUBJECTIVE:  After my company left on the 4th of July and I went on a cleaning spree and forgot to wear the compression. I wore it for about 2 weeks. My hand got better.  PAIN:  Are you having pain? No  SOZO SCREENING: Patient was assessed today using the SOZO machine to determine the lymphedema index score. This was compared to her baseline score. It was determined that she is within the recommended range when compared to her baseline and no further action is needed at this time. She will continue SOZO screenings. These are done every 3 months for 2 years post operatively followed by every 6 months for 2 years, and then annually. Pt has misplaced her sleeve. She will check to see if she can find it. Will check with Val for her sleeve size and message pt with Website where she can purchase if she still hasn't found it.     Waynette Buttery, PT 04/06/2023, 11:16 AM

## 2023-04-21 ENCOUNTER — Encounter (HOSPITAL_BASED_OUTPATIENT_CLINIC_OR_DEPARTMENT_OTHER): Payer: Self-pay

## 2023-04-21 DIAGNOSIS — R0683 Snoring: Secondary | ICD-10-CM

## 2023-04-30 ENCOUNTER — Telehealth: Payer: Self-pay | Admitting: *Deleted

## 2023-04-30 NOTE — Telephone Encounter (Signed)
This RN spoke to pt per her VM left stating concern about being scheduled for a phone visit she noted on MyChart that she was not made aware of or why it is needed.  This RN informed her presently unsure as to reason scheduled and will follow up.  Per phone discussion pt had mildly elevated alk phos at primary MD in July with primary MD.  This note will be forwarded to MD for review.

## 2023-05-07 ENCOUNTER — Telehealth: Payer: Self-pay | Admitting: Hematology and Oncology

## 2023-05-10 ENCOUNTER — Inpatient Hospital Stay: Payer: Medicare Other | Admitting: Hematology and Oncology

## 2023-05-14 ENCOUNTER — Other Ambulatory Visit: Payer: Self-pay

## 2023-05-14 DIAGNOSIS — C50411 Malignant neoplasm of upper-outer quadrant of right female breast: Secondary | ICD-10-CM

## 2023-05-14 DIAGNOSIS — Z86000 Personal history of in-situ neoplasm of breast: Secondary | ICD-10-CM

## 2023-05-14 MED ORDER — ANASTROZOLE 1 MG PO TABS: 1.0000 mg | ORAL_TABLET | Freq: Every day | ORAL | 2 refills | Status: DC

## 2023-05-19 ENCOUNTER — Other Ambulatory Visit: Payer: Self-pay | Admitting: Hematology and Oncology

## 2023-05-19 NOTE — Progress Notes (Signed)
We got a message from primary provider about elevated alk phos bone isoenzyme. I recently saw her in July when she reported no new bone pain. We offered her an earlier visit.I reviewed her alk phos for the past 16 yrs, this has remained pretty much mildly elevated all along. Hence in the absence of no new symptoms, I think its reasonable to continue monitoring. We can order labs during her next visit here unless she calls [place] with any new symptoms.  Claire Bernard

## 2023-06-04 ENCOUNTER — Other Ambulatory Visit: Payer: Self-pay | Admitting: Hematology and Oncology

## 2023-06-04 DIAGNOSIS — Z170 Estrogen receptor positive status [ER+]: Secondary | ICD-10-CM

## 2023-06-07 ENCOUNTER — Inpatient Hospital Stay: Payer: Medicare Other | Attending: Hematology and Oncology

## 2023-06-07 DIAGNOSIS — C50411 Malignant neoplasm of upper-outer quadrant of right female breast: Secondary | ICD-10-CM | POA: Diagnosis present

## 2023-06-07 DIAGNOSIS — Z170 Estrogen receptor positive status [ER+]: Secondary | ICD-10-CM | POA: Insufficient documentation

## 2023-06-07 LAB — CBC WITH DIFFERENTIAL (CANCER CENTER ONLY)
Abs Immature Granulocytes: 0.03 10*3/uL (ref 0.00–0.07)
Basophils Absolute: 0.1 10*3/uL (ref 0.0–0.1)
Basophils Relative: 1 %
Eosinophils Absolute: 0.1 10*3/uL (ref 0.0–0.5)
Eosinophils Relative: 2 %
HCT: 36.1 % (ref 36.0–46.0)
Hemoglobin: 13.1 g/dL (ref 12.0–15.0)
Immature Granulocytes: 1 %
Lymphocytes Relative: 27 %
Lymphs Abs: 1.5 10*3/uL (ref 0.7–4.0)
MCH: 32.3 pg (ref 26.0–34.0)
MCHC: 36.3 g/dL — ABNORMAL HIGH (ref 30.0–36.0)
MCV: 88.9 fL (ref 80.0–100.0)
Monocytes Absolute: 0.4 10*3/uL (ref 0.1–1.0)
Monocytes Relative: 8 %
Neutro Abs: 3.5 10*3/uL (ref 1.7–7.7)
Neutrophils Relative %: 61 %
Platelet Count: 215 10*3/uL (ref 150–400)
RBC: 4.06 MIL/uL (ref 3.87–5.11)
RDW: 13.1 % (ref 11.5–15.5)
WBC Count: 5.6 10*3/uL (ref 4.0–10.5)
nRBC: 0 % (ref 0.0–0.2)

## 2023-06-07 LAB — CMP (CANCER CENTER ONLY)
ALT: 16 U/L (ref 0–44)
AST: 14 U/L — ABNORMAL LOW (ref 15–41)
Albumin: 4.2 g/dL (ref 3.5–5.0)
Alkaline Phosphatase: 123 U/L (ref 38–126)
Anion gap: 7 (ref 5–15)
BUN: 14 mg/dL (ref 8–23)
CO2: 30 mmol/L (ref 22–32)
Calcium: 9.6 mg/dL (ref 8.9–10.3)
Chloride: 102 mmol/L (ref 98–111)
Creatinine: 0.89 mg/dL (ref 0.44–1.00)
GFR, Estimated: >60 mL/min (ref >60)
Glucose, Bld: 164 mg/dL — ABNORMAL HIGH (ref 70–99)
Potassium: 3.9 mmol/L (ref 3.5–5.1)
Sodium: 139 mmol/L (ref 135–145)
Total Bilirubin: 0.4 mg/dL (ref 0.3–1.2)
Total Protein: 7.1 g/dL (ref 6.5–8.1)

## 2023-07-05 ENCOUNTER — Other Ambulatory Visit: Payer: Self-pay | Admitting: Hematology and Oncology

## 2023-07-05 DIAGNOSIS — Z86000 Personal history of in-situ neoplasm of breast: Secondary | ICD-10-CM

## 2023-07-05 DIAGNOSIS — Z170 Estrogen receptor positive status [ER+]: Secondary | ICD-10-CM

## 2023-07-05 DIAGNOSIS — C50411 Malignant neoplasm of upper-outer quadrant of right female breast: Secondary | ICD-10-CM

## 2023-07-06 NOTE — Telephone Encounter (Signed)
Per last OV, continue anastrazole. Lorayne Marek, RN

## 2023-07-12 ENCOUNTER — Ambulatory Visit: Payer: Medicare Other | Attending: Surgery

## 2023-07-12 VITALS — Wt 190.0 lb

## 2023-07-12 DIAGNOSIS — Z483 Aftercare following surgery for neoplasm: Secondary | ICD-10-CM | POA: Insufficient documentation

## 2023-07-12 NOTE — Therapy (Signed)
OUTPATIENT PHYSICAL THERAPY SOZO SCREENING NOTE   Patient Name: Claire Bernard MRN: [id number] DOB:1950/09/16, 72 y.o., female Today's Date: 07/12/2023  PCP: Kaleen Mask, MD REFERRING PROVIDER: Abigail Miyamoto, MD   PT End of Session - 07/12/23 0845     Visit Number 7   # unchanged due to screen only   PT Start Time 0843    PT Stop Time 0847    PT Time Calculation (min) 4 min    Activity Tolerance Patient tolerated treatment well    Behavior During Therapy [facility] for tasks assessed/performed             Past Medical History:  Diagnosis Date   Cancer (HCC) 07/2004   breast cancer   Chest pressure    Clotting disorder (HCC) 1970's   from birthcontrol    Esophageal spasm    Fatigue    GERD (gastroesophageal reflux disease)    History of dizziness    Hyperlipidemia    Hypertension    Laryngitis    Low bone mass    Personal history of radiation therapy 07/2004   left breast   Pre-diabetes    Past Surgical History:  Procedure Laterality Date   ANKLE FRACTURE SURGERY     BREAST LUMPECTOMY Left 07/17/2004   left breast   BREAST LUMPECTOMY Right 11/2021   BREAST LUMPECTOMY WITH RADIOACTIVE SEED AND SENTINEL LYMPH NODE BIOPSY Right 12/15/2021   Procedure: RIGHT BREAST LUMPECTOMY WITH RADIOACTIVE SEED AND SENTINEL LYMPH NODE BIOPSY;  Surgeon: Abigail Miyamoto, MD;  Location: [facility];  Service: General;  Laterality: Right;   cataract surg     KNEE SURGERY     Arthroscopic   SHOULDER SURGERY     VAGINAL HYSTERECTOMY     Patient Active Problem List   Diagnosis Date Noted   Vaginal atrophy 12/08/2021   History of osteopenia 12/08/2021   Genetic testing 12/04/2021   Malignant neoplasm of upper-outer quadrant of right breast in female, estrogen receptor positive (HCC) 11/24/2021   History of ductal carcinoma in situ (DCIS) of breast 10/04/2019   Low bone mass    Hypertension    Esophageal spasm     REFERRING DIAG: right breast  cancer at risk for lymphedema  THERAPY DIAG: Aftercare following surgery for neoplasm  PERTINENT HISTORY:  Patient was diagnosed on 10/30/2021 with right grade II invasive ductal carcinoma breast cancer. She underwent a right lumpectomy and sentinel node biopsy (1 negative node) on 12/15/2021 It is ER/PR positive and HER2 negative with a Ki67 of 40%. She had left breast cancer in 2005 which was treated with surgery, radiation, and anti-estrogen therapy. No axillary nodes removed per her report.   PRECAUTIONS: right UE Lymphedema risk,   SUBJECTIVE: Pt returns for her 3 month L-Dex screen. "I've been cleaning my house a lot. Going to have a TKR in next few weeks."  PAIN:  Are you having pain? No  SOZO SCREENING: Patient was assessed today using the SOZO machine to determine the lymphedema index score. This was compared to her baseline score. It was determined that she is NOT within the recommended range when compared to her baseline. She still has her compression sleeve from last time she tested high. It is recommended she return in 1 month to be reassessed (though this will be a little longer due to upcoming TKR). If she continues to measure outside the recommended range, physical therapy treatment will be recommended at that time and a referral requested.  L-DEX FLOWSHEETS - 07/12/23 0800       L-DEX LYMPHEDEMA SCREENING   Measurement Type Unilateral    L-DEX MEASUREMENT EXTREMITY Upper Extremity    POSITION  Standing    DOMINANT SIDE Right    At Risk Side Right    BASELINE SCORE (UNILATERAL) -5.4    L-DEX SCORE (UNILATERAL) 1.1    VALUE CHANGE (UNILAT) 6.5               Hermenia Bers, PTA 07/12/2023, 8:49 AM

## 2023-07-14 NOTE — Progress Notes (Signed)
COVID Vaccine received:  []  No [x]  Yes Date of any COVID positive Test in last 90 days:  None  PCP - Windle Guard, MD at [facility]-  [phone] Cardiologist - none Oncology- Rachel Moulds, MD  Chest x-ray -07-12-2022  2v  Epic  EKG -  done at PCP- 06-10-2023   on chart Stress Test -  ECHO -  Cardiac Cath -   PCR screen: [x]  Ordered & Completed []   No Order but Needs PROFEND     []   N/A for this surgery  Surgery Plan:  []  Ambulatory   [x]  Outpatient in bed  []  Admit Anesthesia:    []  General  [x]  Spinal  []   Choice []   MAC  Pacemaker / ICD device [x]  No []  Yes   Spinal Cord Stimulator:[x]  No []  Yes       History of Sleep Apnea? [x]  No []  Yes   CPAP used?- [x]  No []  Yes    Does the patient monitor blood sugar?   []  N/A   []  No []  Yes  Patient has: []  NO Hx DM   [x]  Pre-DM   []  DM1  []   DM2 Last A1c was: 5.9 on 06-11-2023       Blood Thinner / Instructions:  None Aspirin Instructions:  none  ERAS Protocol Ordered: []  No  [x]  Yes PRE-SURGERY []  ENSURE  [x]  G2   Patient is to be NPO after: 08:30  Dental hx: []  Dentures:  [x]  N/A      []  Bridge or Partial:                   []  Loose or Damaged teeth:   Comments: Patient was given the 5 CHG shower / bath instructions for TKA surgery along with 2 bottles of the CHG soap. Patient will start this on: Friday  07-23-2023  All questions were asked and answered, Patient voiced understanding of this process.   Activity level: Patient is able to climb a flight of stairs without difficulty [x]  No CP but would have leg pain and some SOB.  Patient can perform ADLs without assistance.   Anesthesia review:  Pre-DM, anxiety, HTN, GERD, s/p right breast lumpectomy- on chemo, Abn. LFTs  Patient denies shortness of breath, fever, cough and chest pain at PAT appointment.  Patient verbalized understanding and agreement to the Pre-Surgical Instructions that were given to them at this PAT appointment. Patient was also educated of the  need to review these PAT instructions again prior to her surgery.I reviewed the appropriate phone numbers to call if they have any and questions or concerns.

## 2023-07-14 NOTE — Patient Instructions (Signed)
SURGICAL WAITING ROOM VISITATION Patients having surgery or a procedure may have no more than 2 support people in the waiting area - these visitors may rotate in the visitor waiting room.   Due to an increase in RSV and influenza rates and associated hospitalizations, children ages 48 and under may not visit patients in [facility] hospitals. If the patient needs to stay at the hospital during part of their recovery, the visitor guidelines for inpatient rooms apply.  PRE-OP VISITATION  Pre-op nurse will coordinate an appropriate time for 1 support person to accompany the patient in pre-op.  This support person may not rotate.  This visitor will be contacted when the time is appropriate for the visitor to come back in the pre-op area.  Please refer to the [facility] website for the visitor guidelines for Inpatients (after your surgery is over and you are in a regular room).  You are not required to quarantine at this time prior to your surgery. However, you must do this: Hand Hygiene often Do NOT share personal items Notify your provider if you are in close contact with someone who has COVID or you develop fever 100.4 or greater, new onset of sneezing, cough, sore throat, shortness of breath or body aches.  If you test positive for Covid or have been in contact with anyone that has tested positive in the last 10 days please notify you surgeon.    Your procedure is scheduled on:  Tuesday  July 27, 2023  Report to [facility] Main Entrance: [place] entrance where the [employer] is available.   Report to admitting at: 09:00    AM  Call this number if you have any questions or problems the morning of surgery [phone]  Do not eat food after Midnight the night prior to your surgery/procedure.  After Midnight you may have the following liquids until  08:30 AM DAY OF SURGERY  Clear Liquid Diet Water Black Coffee (sugar ok, NO MILK/CREAM OR CREAMERS)  Tea (sugar ok, NO  MILK/CREAM OR CREAMERS) regular and decaf                             Plain Jell-O  with no fruit (NO RED)                                           Fruit ices (not with fruit pulp, NO RED)                                     Popsicles (NO RED)                                                                  Juice: NO CITRUS JUICES: only apple, WHITE grape, WHITE cranberry Sports drinks like Gatorade or Powerade (NO RED)                    The day of surgery:  Drink ONE (1) Pre-Surgery G2 at    08:30    AM  the morning of surgery. Drink in one sitting. Do not sip.  This drink was given to you during your hospital pre-op appointment visit. Nothing else to drink after completing the Pre-Surgery  G2 : No candy, chewing gum or throat lozenges.    FOLLOW ANY ADDITIONAL PRE OP INSTRUCTIONS YOU RECEIVED FROM YOUR SURGEON'S OFFICE!!!   Oral Hygiene is also important to reduce your risk of infection.        Remember - BRUSH YOUR TEETH THE MORNING OF SURGERY WITH YOUR REGULAR TOOTHPASTE  Do NOT smoke after Midnight the night before surgery.  STOP TAKING all Vitamins, Herbs and supplements 1 week before your surgery.   Take ONLY these medicines the morning of surgery with A SIP OF WATER: Buspirone (Buspar), cetirizine (Zyrtec), anastrozole (Arimidex)   You may use your Eye drops if needed.  You may not have any metal on your body including hair pins, jewelry, and body piercing  Do not wear make-up, lotions, powders, perfumes or deodorant  Do not wear nail polish including gel and S&S, artificial / acrylic nails, or any other type of covering on natural nails including finger and toenails. If you have artificial nails, gel coating, etc., that needs to be removed by a nail salon, Please have this removed prior to surgery. Not doing so may mean that your surgery could be cancelled or delayed if the Surgeon or anesthesia staff feels like they are unable to monitor you safely.   Do not shave 48  hours prior to surgery to avoid nicks in your skin which may contribute to postoperative infections.   Contacts, Hearing Aids, dentures or bridgework may not be worn into surgery. DENTURES WILL BE REMOVED PRIOR TO SURGERY PLEASE DO NOT APPLY "Poly grip" OR ADHESIVES!!!  You may bring a small overnight bag with you on the day of surgery, only pack items that are not valuable. [facility] IS NOT RESPONSIBLE   FOR VALUABLES THAT ARE LOST OR STOLEN.   Do not bring your home medications to the hospital. The Pharmacy will dispense medications listed on your medication list to you during your admission in the Hospital.  Please read over the following fact sheets you were given: IF YOU HAVE QUESTIONS ABOUT YOUR PRE-OP INSTRUCTIONS, PLEASE CALL [phone].     Pre-operative 5 CHG Bath Instructions   You can play a key role in reducing the risk of infection after surgery. Your skin needs to be as free of germs as possible. You can reduce the number of germs on your skin by washing with CHG (chlorhexidine gluconate) soap before surgery. CHG is an antiseptic soap that kills germs and continues to kill germs even after washing.   DO NOT use if you have an allergy to chlorhexidine/CHG or antibacterial soaps. If your skin becomes reddened or irritated, stop using the CHG and notify one of our RNs at [phone]  Please shower with the CHG soap starting 4 days before surgery using the following schedule: START SHOWERS ON  Friday  July 23, 2023  Please keep in mind the following:  DO NOT shave, including legs and underarms, starting the day of your first shower.   You may shave your face at any point before/day of surgery.   Place clean sheets on your bed the day you start using CHG soap. Use a clean washcloth (not  used since being washed) for each shower. DO NOT sleep with pets once you start using the CHG.   CHG Shower Instructions:  If you choose to wash your hair and private area, wash first with your normal shampoo/soap.  After you use shampoo/soap, rinse your hair and body thoroughly to remove shampoo/soap residue.  Turn the water OFF and apply about 3 tablespoons (45 ml) of CHG soap to a CLEAN washcloth.  Apply CHG soap ONLY FROM YOUR NECK DOWN TO YOUR TOES (washing for 3-5 minutes)  DO NOT use CHG soap on face, private areas, open wounds, or sores.  Pay special attention to the area where your surgery is being performed.  If you are having back surgery, having someone wash your back for you may be helpful.  Wait 2 minutes after CHG soap is applied, then you may rinse off the CHG soap.  Pat dry with a clean towel  Put on clean clothes/pajamas   If you choose to wear lotion, please use ONLY the CHG-compatible lotions on the back of this paper.     Additional instructions for the day of surgery: DO NOT APPLY any lotions, deodorants, cologne, or perfumes.   Put on clean/comfortable clothes.  Brush your teeth.  Ask your nurse before applying any prescription medications to the skin.      CHG Compatible Lotions   Aveeno Moisturizing lotion  Cetaphil Moisturizing Cream  Cetaphil Moisturizing Lotion  Clairol Herbal Essence Moisturizing Lotion, Dry Skin  Clairol Herbal Essence Moisturizing Lotion, Extra Dry Skin  Clairol Herbal Essence Moisturizing Lotion, Normal Skin  Curel Age Defying Therapeutic Moisturizing Lotion with Alpha Hydroxy  Curel Extreme Care Body Lotion  Curel Soothing Hands Moisturizing Hand Lotion  Curel Therapeutic Moisturizing Cream, Fragrance-Free  Curel Therapeutic Moisturizing Lotion, Fragrance-Free  Curel Therapeutic Moisturizing Lotion, Original Formula  Eucerin Daily Replenishing Lotion  Eucerin Dry Skin Therapy Plus Alpha Hydroxy Crme  Eucerin Dry Skin  Therapy Plus Alpha Hydroxy Lotion  Eucerin Original Crme  Eucerin Original Lotion  Eucerin Plus Crme Eucerin Plus Lotion  Eucerin TriLipid Replenishing Lotion  Keri Anti-Bacterial Hand Lotion  Keri Deep Conditioning Original Lotion Dry Skin Formula Softly Scented  Keri Deep Conditioning Original Lotion, Fragrance Free Sensitive Skin Formula  Keri Lotion Fast Absorbing Fragrance Free Sensitive Skin Formula  Keri Lotion Fast Absorbing Softly Scented Dry Skin Formula  Keri Original Lotion  Keri Skin Renewal Lotion Keri Silky Smooth Lotion  Keri Silky Smooth Sensitive Skin Lotion  Nivea Body Creamy Conditioning Oil  Nivea Body Extra Enriched Lotion  Nivea Body Original Lotion  Nivea Body Sheer Moisturizing Lotion Nivea Crme  Nivea Skin Firming Lotion  NutraDerm 30 Skin Lotion  NutraDerm Skin Lotion  NutraDerm Therapeutic Skin Cream  NutraDerm Therapeutic Skin Lotion  ProShield Protective Hand Cream  Provon moisturizing lotion   FAILURE TO FOLLOW THESE INSTRUCTIONS MAY RESULT IN THE CANCELLATION OF YOUR SURGERY  PATIENT SIGNATURE_________________________________  NURSE SIGNATURE__________________________________  ________________________________________________________________________      Rogelia Mire    An incentive spirometer is a tool that can help keep your lungs clear and active. This tool measures how well you are filling your lungs with each breath. Taking long  deep breaths may help reverse or decrease the chance of developing breathing (pulmonary) problems (especially infection) following: A long period of time when you are unable to move or be active. BEFORE THE PROCEDURE  If the spirometer includes an indicator to show your best effort, your nurse or respiratory therapist will set it to a desired goal. If possible, sit up straight or lean slightly forward. Try not to slouch. Hold the incentive spirometer in an upright position. INSTRUCTIONS FOR USE   Sit on the edge of your bed if possible, or sit up as far as you can in bed or on a chair. Hold the incentive spirometer in an upright position. Breathe out normally. Place the mouthpiece in your mouth and seal your lips tightly around it. Breathe in slowly and as deeply as possible, raising the piston or the ball toward the top of the column. Hold your breath for 3-5 seconds or for as long as possible. Allow the piston or ball to fall to the bottom of the column. Remove the mouthpiece from your mouth and breathe out normally. Rest for a few seconds and repeat Steps 1 through 7 at least 10 times every 1-2 hours when you are awake. Take your time and take a few normal breaths between deep breaths. The spirometer may include an indicator to show your best effort. Use the indicator as a goal to work toward during each repetition. After each set of 10 deep breaths, practice coughing to be sure your lungs are clear. If you have an incision (the cut made at the time of surgery), support your incision when coughing by placing a pillow or rolled up towels firmly against it. Once you are able to get out of bed, walk around indoors and cough well. You may stop using the incentive spirometer when instructed by your caregiver.  RISKS AND COMPLICATIONS Take your time so you do not get dizzy or light-headed. If you are in pain, you may need to take or ask for pain medication before doing incentive spirometry. It is harder to take a deep breath if you are having pain. AFTER USE Rest and breathe slowly and easily. It can be helpful to keep track of a log of your progress. Your caregiver can provide you with a simple table to help with this. If you are using the spirometer at home, follow these instructions: SEEK MEDICAL CARE IF:  You are having difficultly using the spirometer. You have trouble using the spirometer as often as instructed. Your pain medication is not giving enough relief while using the  spirometer. You develop fever of 100.5 F (38.1 C) or higher.                                                                                                    SEEK IMMEDIATE MEDICAL CARE IF:  You cough up bloody sputum that had not been present before. You develop fever of 102 F (38.9 C) or greater. You develop worsening pain at or near the incision site. MAKE SURE YOU:  Understand these instructions. Will watch your condition. Will  get help right away if you are not doing well or get worse. Document Released: 12/28/2006 Document Revised: 11/09/2011 Document Reviewed: 02/28/2007 [facility] Patient Information 2014 [place], [place].

## 2023-07-15 ENCOUNTER — Encounter (HOSPITAL_COMMUNITY)
Admission: RE | Admit: 2023-07-15 | Discharge: 2023-07-15 | Disposition: A | Discharge status: Home / Self Care | Payer: Medicare Other | Source: Ambulatory Visit | Attending: Orthopedic Surgery | Admitting: Orthopedic Surgery

## 2023-07-15 ENCOUNTER — Encounter (HOSPITAL_COMMUNITY): Payer: Self-pay

## 2023-07-15 ENCOUNTER — Other Ambulatory Visit: Payer: Self-pay

## 2023-07-15 VITALS — BP 118/76 | HR 80 | Temp 97.9°F | Resp 18 | Ht 65.5 in | Wt 187.0 lb

## 2023-07-15 DIAGNOSIS — I10 Essential (primary) hypertension: Secondary | ICD-10-CM | POA: Diagnosis not present

## 2023-07-15 DIAGNOSIS — Z9221 Personal history of antineoplastic chemotherapy: Secondary | ICD-10-CM | POA: Diagnosis not present

## 2023-07-15 DIAGNOSIS — Z01818 Encounter for other preprocedural examination: Secondary | ICD-10-CM | POA: Insufficient documentation

## 2023-07-15 HISTORY — DX: Myoneural disorder, unspecified: G70.9

## 2023-07-15 HISTORY — DX: Anxiety disorder, unspecified: F41.9

## 2023-07-15 LAB — COMPREHENSIVE METABOLIC PANEL
ALT: 17 U/L (ref 0–44)
AST: 17 U/L (ref 15–41)
Albumin: 4.1 g/dL (ref 3.5–5.0)
Alkaline Phosphatase: 110 U/L (ref 38–126)
Anion gap: 11 (ref 5–15)
BUN: 20 mg/dL (ref 8–23)
CO2: 24 mmol/L (ref 22–32)
Calcium: 9.2 mg/dL (ref 8.9–10.3)
Chloride: 101 mmol/L (ref 98–111)
Creatinine, Ser: 0.59 mg/dL (ref 0.44–1.00)
GFR, Estimated: >60 mL/min (ref >60)
Glucose, Bld: 123 mg/dL — ABNORMAL HIGH (ref 70–99)
Potassium: 3.6 mmol/L (ref 3.5–5.1)
Sodium: 136 mmol/L (ref 135–145)
Total Bilirubin: 0.8 mg/dL (ref <1.2)
Total Protein: 7.2 g/dL (ref 6.5–8.1)

## 2023-07-15 LAB — CBC
HCT: 35.5 % — ABNORMAL LOW (ref 36.0–46.0)
Hemoglobin: 12.5 g/dL (ref 12.0–15.0)
MCH: 32.2 pg (ref 26.0–34.0)
MCHC: 35.2 g/dL (ref 30.0–36.0)
MCV: 91.5 fL (ref 80.0–100.0)
Platelets: 260 10*3/uL (ref 150–400)
RBC: 3.88 MIL/uL (ref 3.87–5.11)
RDW: 13 % (ref 11.5–15.5)
WBC: 5.4 10*3/uL (ref 4.0–10.5)
nRBC: 0 % (ref 0.0–0.2)

## 2023-07-15 LAB — SURGICAL PCR SCREEN
MRSA, PCR: NEGATIVE
Staphylococcus aureus: NEGATIVE

## 2023-07-22 NOTE — H&P (Signed)
TOTAL KNEE ADMISSION H&P  Patient is being admitted for right total knee arthroplasty.  Therapy Plans: outpatient therapy at [facility] Disposition: Home with husband Planned DVT Prophylaxis: aspirin 81mg  BID DME needed: walker, CTU PCP: Dr. Jeannetta Nap - Oncologist: Dr. Al Pimple - hx of breast cancer in 2023, s/p lumpectomy Allergies: NKDA Anesthesia Concerns: none BMI: 31.4 Last HgbA1c: 5.8%   Other: - ice machine at hospital - oxycodone, robaxin, tylenol, - Nausea with pain meds in the past - can't recall which one - Wants foley catheter no matter what - No hx of VTE  Subjective:  Chief Complaint:right knee pain.  HPI: Claire Bernard, 72 y.o. female, has a history of pain and functional disability in the right knee due to arthritis and has failed non-surgical conservative treatments for greater than 12 weeks to includeNSAID's and/or analgesics, corticosteriod injections, and activity modification.  Onset of symptoms was gradual, starting 2 years ago with gradually worsening course since that time. The patient noted prior procedures on the knee to include  arthroscopy and menisectomy on the right knee(s).  Patient currently rates pain in the right knee(s) at 8 out of 10 with activity. Patient has worsening of pain with activity and weight bearing and pain that interferes with activities of daily living.  Patient has evidence of joint space narrowing by imaging studies. There is no active infection.  Patient Active Problem List   Diagnosis Date Noted   Vaginal atrophy 12/08/2021   History of osteopenia 12/08/2021   Genetic testing 12/04/2021   Malignant neoplasm of upper-outer quadrant of right breast in female, estrogen receptor positive (HCC) 11/24/2021   History of ductal carcinoma in situ (DCIS) of breast 10/04/2019   Low bone mass    Hypertension    Esophageal spasm    Past Medical History:  Diagnosis Date   Anxiety    panic attacks   Cancer (HCC) 07/2004   breast cancer    Chest pressure    Clotting disorder (HCC) 1970's   from birthcontrol    Esophageal spasm    Fatigue    GERD (gastroesophageal reflux disease)    History of dizziness    Hyperlipidemia    Hypertension    Laryngitis    Low bone mass    Neuromuscular disorder (HCC)    neuropathy in both feet   Personal history of radiation therapy 07/2004   left breast   Pre-diabetes     Past Surgical History:  Procedure Laterality Date   ANKLE FRACTURE SURGERY     BREAST LUMPECTOMY Left 07/17/2004   left breast   BREAST LUMPECTOMY Right 11/2021   BREAST LUMPECTOMY WITH RADIOACTIVE SEED AND SENTINEL LYMPH NODE BIOPSY Right 12/15/2021   Procedure: RIGHT BREAST LUMPECTOMY WITH RADIOACTIVE SEED AND SENTINEL LYMPH NODE BIOPSY;  Surgeon: Abigail Miyamoto, MD;  Location: [facility];  Service: General;  Laterality: Right;   cataract surg     KNEE SURGERY     Arthroscopic   SHOULDER SURGERY     VAGINAL HYSTERECTOMY      No current facility-administered medications for this encounter.   Current Outpatient Medications  Medication Sig Dispense Refill Last Dose   anastrozole (ARIMIDEX) 1 MG tablet TAKE 1 TABLET BY MOUTH DAILY 100 tablet 2    busPIRone (BUSPAR) 5 MG tablet Take 5 mg by mouth 2 (two) times daily.      carboxymethylcellulose (REFRESH TEARS) 0.5 % SOLN Place 1 drop into both eyes daily as needed (dry eyes).  cetirizine (ZYRTEC) 10 MG tablet Take 10 mg by mouth daily.      Cholecalciferol (VITAMIN D-3) 125 MCG (5000 UT) TABS Take 5,000 mg by mouth every morning.      hydrochlorothiazide (HYDRODIURIL) 25 MG tablet Take 25 mg by mouth every morning.      lisinopril (ZESTRIL) 20 MG tablet Take 20 mg by mouth at bedtime.      naproxen (NAPROSYN) 500 MG tablet Take 500 mg by mouth every 12 (twelve) hours.      nitrofurantoin, macrocrystal-monohydrate, (MACROBID) 100 MG capsule Take 100 mg by mouth 2 (two) times daily.      traZODone (DESYREL) 150 MG tablet Take 150 mg by mouth  at bedtime.      Allergies  Allergen Reactions   Other Rash    Tide & Gain Detergent    Social History   Tobacco Use   Smoking status: Former    Current packs/day: 0.00    Average packs/day: 0.5 packs/day for 20.0 years (10.0 ttl pk-yrs)    Types: Cigarettes    Start date: 08/31/1970    Quit date: 08/31/1990    Years since quitting: 32.9   Smokeless tobacco: Never  Substance Use Topics   Alcohol use: Yes    Alcohol/week: 0.0 standard drinks of alcohol    Comment: Rare    Family History  Problem Relation Age of Onset   Atrial fibrillation Father    Emphysema Father    Heart attack Father        multiple   Heart disease Father    Hypertension Sister    Stroke Sister        x6 strokes is 91 y/o   Asthma Sister    Diabetes Sister    Depression Brother    Heart disease Brother    Coronary artery disease Brother        x3 CABG   Atrial fibrillation Maternal Grandmother    Diabetes Maternal Grandmother    Heart disease Maternal Grandmother    Hypertension Maternal Grandmother    Atrial fibrillation Maternal Grandfather    Heart disease Maternal Grandfather    Hypertension Maternal Grandfather    Breast cancer Paternal Grandmother        dx. 66s   Cancer Paternal Grandmother        uterine or ovarian, unsure if breast cancer metastasis or second primary   Heart disease Paternal Grandfather    Hypertension Paternal Grandfather    Atrial fibrillation Paternal Grandfather    Breast cancer Paternal Aunt        dx. 21s   Colon cancer Paternal Aunt    Colon cancer Paternal Uncle        dx. 50s   Lung cancer Paternal Uncle      Review of Systems  Constitutional:  Negative for chills and fever.  Respiratory:  Negative for cough and shortness of breath.   Cardiovascular:  Negative for chest pain.  Gastrointestinal:  Negative for nausea and vomiting.  Musculoskeletal:  Positive for arthralgias.     Objective:  Physical Exam Well nourished and well  developed. General: Alert and oriented x3, cooperative and pleasant, no acute distress. Head: normocephalic, atraumatic, neck supple. Eyes: EOMI.  Musculoskeletal: Right knee exam: No palpable effusion, warmth erythema Slight genu valgus with passive correction without significant flexion contracture Flexion over 100 degrees with tightness and discomfort over the anterior lateral aspect the knee Tenderness laterally more so than medially Stable medial and lateral collateral ligaments  Calves soft and nontender. Motor function intact in LE. Strength 5/5 LE bilaterally. Neuro: Distal pulses 2+. Sensation to light touch intact in LE.  Vital signs in last 24 hours:    Labs:   Estimated body mass index is 30.65 kg/m as calculated from the following:   Height as of 07/15/23: 5' 5.5" (1.664 m).   Weight as of 07/15/23: 84.8 kg.   Imaging Review Plain radiographs demonstrate severe degenerative joint disease of the right knee(s). The overall alignment isneutral. The bone quality appears to be adequate for age and reported activity level.      Assessment/Plan:  End stage arthritis, right knee   The patient history, physical examination, clinical judgment of the provider and imaging studies are consistent with end stage degenerative joint disease of the right knee(s) and total knee arthroplasty is deemed medically necessary. The treatment options including medical management, injection therapy arthroscopy and arthroplasty were discussed at length. The risks and benefits of total knee arthroplasty were presented and reviewed. The risks due to aseptic loosening, infection, stiffness, patella tracking problems, thromboembolic complications and other imponderables were discussed. The patient acknowledged the explanation, agreed to proceed with the plan and consent was signed. Patient is being admitted for inpatient treatment for surgery, pain control, PT, OT, prophylactic antibiotics, VTE  prophylaxis, progressive ambulation and ADL's and discharge planning. The patient is planning to be discharged  home.     Patient's anticipated LOS is less than 2 midnights, meeting these requirements: - Younger than 2 - Lives within 1 hour of care - Has a competent adult at home to recover with post-op recover - NO history of  - Chronic pain requiring opiods  - Diabetes  - Coronary Artery Disease  - Heart failure  - Heart attack  - Stroke  - DVT/VTE  - Cardiac arrhythmia  - Respiratory Failure/COPD  - Renal failure  - Anemia  - Advanced Liver disease  Rosalene Billings, PA-C Orthopedic Surgery [facility] [place] [phone]

## 2023-07-27 ENCOUNTER — Observation Stay (HOSPITAL_COMMUNITY)
Admission: RE | Admit: 2023-07-27 | Discharge: 2023-07-28 | Disposition: A | Discharge status: Home / Self Care | Payer: Medicare Other | Source: Ambulatory Visit | Attending: Orthopedic Surgery | Admitting: Orthopedic Surgery

## 2023-07-27 ENCOUNTER — Other Ambulatory Visit: Payer: Self-pay

## 2023-07-27 ENCOUNTER — Ambulatory Visit (HOSPITAL_COMMUNITY): Payer: Medicare Other | Admitting: Anesthesiology

## 2023-07-27 ENCOUNTER — Encounter (HOSPITAL_COMMUNITY): Payer: Self-pay | Admitting: Orthopedic Surgery

## 2023-07-27 ENCOUNTER — Encounter (HOSPITAL_COMMUNITY)
Admission: RE | Disposition: A | Discharge status: Home / Self Care | Payer: Self-pay | Source: Ambulatory Visit | Attending: Orthopedic Surgery

## 2023-07-27 DIAGNOSIS — Z853 Personal history of malignant neoplasm of breast: Secondary | ICD-10-CM | POA: Insufficient documentation

## 2023-07-27 DIAGNOSIS — I10 Essential (primary) hypertension: Secondary | ICD-10-CM | POA: Insufficient documentation

## 2023-07-27 DIAGNOSIS — Z87891 Personal history of nicotine dependence: Secondary | ICD-10-CM | POA: Insufficient documentation

## 2023-07-27 DIAGNOSIS — M1711 Unilateral primary osteoarthritis, right knee: Secondary | ICD-10-CM | POA: Diagnosis present

## 2023-07-27 DIAGNOSIS — Z79899 Other long term (current) drug therapy: Secondary | ICD-10-CM | POA: Diagnosis not present

## 2023-07-27 DIAGNOSIS — Z96651 Presence of right artificial knee joint: Principal | ICD-10-CM

## 2023-07-27 HISTORY — PX: TOTAL KNEE ARTHROPLASTY: SHX125

## 2023-07-27 SURGERY — ARTHROPLASTY, KNEE, TOTAL
Anesthesia: Spinal | Site: Knee | Laterality: Right

## 2023-07-27 MED ORDER — ALUM & MAG HYDROXIDE-SIMETH 200-200-20 MG/5ML PO SUSP: 30.0000 mL | ORAL | Status: DC | PRN

## 2023-07-27 MED ORDER — KETOROLAC TROMETHAMINE 30 MG/ML IJ SOLN
INTRAMUSCULAR | Status: AC
  Filled 2023-07-27: qty 1

## 2023-07-27 MED ORDER — CEFAZOLIN SODIUM-DEXTROSE 2-4 GM/100ML-% IV SOLN
2.0000 g | Freq: Four times a day (QID) | INTRAVENOUS | Status: AC
Start: 2023-07-27 — End: 2023-07-28
  Administered 2023-07-27 (×2): 2 g via INTRAVENOUS
  Filled 2023-07-27 (×2): qty 100

## 2023-07-27 MED ORDER — CEFAZOLIN SODIUM-DEXTROSE 2-4 GM/100ML-% IV SOLN
2.0000 g | INTRAVENOUS | Status: AC
  Administered 2023-07-27: 2 g via INTRAVENOUS
  Filled 2023-07-27: qty 100

## 2023-07-27 MED ORDER — ORAL CARE MOUTH RINSE: 15.0000 mL | Freq: Once | OROMUCOSAL | Status: AC

## 2023-07-27 MED ORDER — SODIUM CHLORIDE (PF) 0.9 % IJ SOLN
INTRAMUSCULAR | Status: DC | PRN
  Administered 2023-07-27: 30 mL via INTRAVENOUS

## 2023-07-27 MED ORDER — BUPIVACAINE-EPINEPHRINE (PF) 0.25% -1:200000 IJ SOLN
INTRAMUSCULAR | Status: DC | PRN
  Administered 2023-07-27: 30 mL

## 2023-07-27 MED ORDER — HYDROMORPHONE HCL 1 MG/ML IJ SOLN: 0.2500 mg | INTRAMUSCULAR | Status: DC | PRN

## 2023-07-27 MED ORDER — POLYETHYLENE GLYCOL 3350 17 G PO PACK
17.0000 g | PACK | Freq: Two times a day (BID) | ORAL | Status: DC
  Administered 2023-07-27: 17 g via ORAL
  Filled 2023-07-27: qty 1

## 2023-07-27 MED ORDER — SENNA 8.6 MG PO TABS
2.0000 | ORAL_TABLET | Freq: Every day | ORAL | Status: DC
  Administered 2023-07-27: 17.2 mg via ORAL
  Filled 2023-07-27: qty 2

## 2023-07-27 MED ORDER — ONDANSETRON HCL 4 MG/2ML IJ SOLN: 4.0000 mg | Freq: Four times a day (QID) | INTRAMUSCULAR | Status: DC | PRN

## 2023-07-27 MED ORDER — OXYCODONE HCL 5 MG PO TABS: 5.0000 mg | ORAL_TABLET | Freq: Once | ORAL | Status: DC | PRN

## 2023-07-27 MED ORDER — PHENYLEPHRINE HCL (PRESSORS) 10 MG/ML IV SOLN
INTRAVENOUS | Status: AC
  Filled 2023-07-27: qty 1

## 2023-07-27 MED ORDER — TRANEXAMIC ACID-NACL 1000-0.7 MG/100ML-% IV SOLN
1000.0000 mg | INTRAVENOUS | Status: AC
  Administered 2023-07-27: 1000 mg via INTRAVENOUS
  Filled 2023-07-27: qty 100

## 2023-07-27 MED ORDER — DIPHENHYDRAMINE HCL 12.5 MG/5ML PO ELIX: 12.5000 mg | ORAL_SOLUTION | ORAL | Status: DC | PRN

## 2023-07-27 MED ORDER — POVIDONE-IODINE 10 % EX SWAB: 2.0000 | Freq: Once | CUTANEOUS | Status: DC

## 2023-07-27 MED ORDER — OXYCODONE HCL 5 MG PO TABS: 10.0000 mg | ORAL_TABLET | ORAL | Status: DC | PRN

## 2023-07-27 MED ORDER — METOCLOPRAMIDE HCL 5 MG PO TABS: 5.0000 mg | ORAL_TABLET | Freq: Three times a day (TID) | ORAL | Status: DC | PRN

## 2023-07-27 MED ORDER — ONDANSETRON HCL 4 MG/2ML IJ SOLN
INTRAMUSCULAR | Status: DC | PRN
  Administered 2023-07-27: 4 mg via INTRAVENOUS

## 2023-07-27 MED ORDER — METOCLOPRAMIDE HCL 5 MG/ML IJ SOLN: 5.0000 mg | Freq: Three times a day (TID) | INTRAMUSCULAR | Status: DC | PRN

## 2023-07-27 MED ORDER — ONDANSETRON HCL 4 MG PO TABS
4.0000 mg | ORAL_TABLET | Freq: Four times a day (QID) | ORAL | Status: DC | PRN
Start: 2023-07-27 — End: 2023-07-28

## 2023-07-27 MED ORDER — BISACODYL 10 MG RE SUPP: 10.0000 mg | Freq: Every day | RECTAL | Status: DC | PRN

## 2023-07-27 MED ORDER — SODIUM CHLORIDE (PF) 0.9 % IJ SOLN
INTRAMUSCULAR | Status: AC
  Filled 2023-07-27: qty 30

## 2023-07-27 MED ORDER — PHENYLEPHRINE HCL (PRESSORS) 10 MG/ML IV SOLN
INTRAVENOUS | Status: DC | PRN
  Administered 2023-07-27: 80 ug via INTRAVENOUS
  Administered 2023-07-27 (×2): 160 ug via INTRAVENOUS

## 2023-07-27 MED ORDER — TRANEXAMIC ACID-NACL 1000-0.7 MG/100ML-% IV SOLN
1000.0000 mg | Freq: Once | INTRAVENOUS | Status: AC
  Administered 2023-07-27: 1000 mg via INTRAVENOUS
  Filled 2023-07-27: qty 100

## 2023-07-27 MED ORDER — OXYCODONE HCL 5 MG/5ML PO SOLN: 5.0000 mg | Freq: Once | ORAL | Status: DC | PRN

## 2023-07-27 MED ORDER — ONDANSETRON HCL 4 MG/2ML IJ SOLN: 4.0000 mg | Freq: Once | INTRAMUSCULAR | Status: DC | PRN

## 2023-07-27 MED ORDER — KETOROLAC TROMETHAMINE 30 MG/ML IJ SOLN
INTRAMUSCULAR | Status: DC | PRN
  Administered 2023-07-27: 30 mg via INTRAVENOUS

## 2023-07-27 MED ORDER — ASPIRIN 81 MG PO CHEW
81.0000 mg | CHEWABLE_TABLET | Freq: Two times a day (BID) | ORAL | Status: DC
  Administered 2023-07-27 – 2023-07-28 (×2): 81 mg via ORAL
  Filled 2023-07-27 (×2): qty 1

## 2023-07-27 MED ORDER — BUPIVACAINE-EPINEPHRINE 0.25% -1:200000 IJ SOLN
INTRAMUSCULAR | Status: AC
  Filled 2023-07-27: qty 1

## 2023-07-27 MED ORDER — LORATADINE 10 MG PO TABS
10.0000 mg | ORAL_TABLET | Freq: Every day | ORAL | Status: DC
  Administered 2023-07-28: 10 mg via ORAL
  Filled 2023-07-27: qty 1

## 2023-07-27 MED ORDER — HYDROMORPHONE HCL 1 MG/ML IJ SOLN: 0.5000 mg | INTRAMUSCULAR | Status: DC | PRN

## 2023-07-27 MED ORDER — HYDROCHLOROTHIAZIDE 25 MG PO TABS
25.0000 mg | ORAL_TABLET | ORAL | Status: DC
  Administered 2023-07-28: 25 mg via ORAL
  Filled 2023-07-27: qty 1

## 2023-07-27 MED ORDER — BUSPIRONE HCL 5 MG PO TABS
5.0000 mg | ORAL_TABLET | Freq: Two times a day (BID) | ORAL | Status: DC
  Administered 2023-07-27 – 2023-07-28 (×2): 5 mg via ORAL
  Filled 2023-07-27 (×2): qty 1

## 2023-07-27 MED ORDER — STERILE WATER FOR IRRIGATION IR SOLN
Status: DC | PRN
  Administered 2023-07-27: 1000 mL

## 2023-07-27 MED ORDER — PROPOFOL 500 MG/50ML IV EMUL
INTRAVENOUS | Status: DC | PRN
  Administered 2023-07-27: 75 ug/kg/min via INTRAVENOUS

## 2023-07-27 MED ORDER — AMISULPRIDE (ANTIEMETIC) 5 MG/2ML IV SOLN: 10.0000 mg | Freq: Once | INTRAVENOUS | Status: DC | PRN

## 2023-07-27 MED ORDER — ACETAMINOPHEN 500 MG PO TABS
1000.0000 mg | ORAL_TABLET | Freq: Four times a day (QID) | ORAL | Status: DC
  Administered 2023-07-27 – 2023-07-28 (×4): 1000 mg via ORAL
  Filled 2023-07-27 (×4): qty 2

## 2023-07-27 MED ORDER — METHOCARBAMOL 500 MG PO TABS
500.0000 mg | ORAL_TABLET | Freq: Four times a day (QID) | ORAL | Status: DC | PRN
  Administered 2023-07-27 – 2023-07-28 (×2): 500 mg via ORAL
  Filled 2023-07-27 (×2): qty 1

## 2023-07-27 MED ORDER — DEXAMETHASONE SODIUM PHOSPHATE 10 MG/ML IJ SOLN
8.0000 mg | Freq: Once | INTRAMUSCULAR | Status: AC
  Administered 2023-07-27: 8 mg via INTRAVENOUS

## 2023-07-27 MED ORDER — SODIUM CHLORIDE 0.9% FLUSH: 10.0000 mL | Freq: Two times a day (BID) | INTRAVENOUS | Status: DC

## 2023-07-27 MED ORDER — MIDAZOLAM HCL 2 MG/2ML IJ SOLN
INTRAMUSCULAR | Status: AC
  Administered 2023-07-27: 1 mg
  Filled 2023-07-27: qty 2

## 2023-07-27 MED ORDER — TRAZODONE HCL 50 MG PO TABS
150.0000 mg | ORAL_TABLET | Freq: Every day | ORAL | Status: DC
  Administered 2023-07-27: 150 mg via ORAL
  Filled 2023-07-27: qty 1

## 2023-07-27 MED ORDER — SODIUM CHLORIDE 0.9% FLUSH
10.0000 mL | Freq: Two times a day (BID) | INTRAVENOUS | Status: DC
  Administered 2023-07-28: 10 mL via INTRAVENOUS

## 2023-07-27 MED ORDER — ROPIVACAINE HCL 5 MG/ML IJ SOLN
INTRAMUSCULAR | Status: DC | PRN
  Administered 2023-07-27: 30 mL via PERINEURAL

## 2023-07-27 MED ORDER — CHLORHEXIDINE GLUCONATE 0.12 % MT SOLN
15.0000 mL | Freq: Once | OROMUCOSAL | Status: AC
  Administered 2023-07-27: 15 mL via OROMUCOSAL

## 2023-07-27 MED ORDER — OXYCODONE HCL 5 MG PO TABS
5.0000 mg | ORAL_TABLET | ORAL | Status: DC | PRN
  Administered 2023-07-27: 10 mg via ORAL
  Administered 2023-07-28 (×3): 5 mg via ORAL
  Filled 2023-07-27 (×2): qty 2
  Filled 2023-07-27 (×3): qty 1

## 2023-07-27 MED ORDER — LACTATED RINGERS IV SOLN: INTRAVENOUS | Status: DC

## 2023-07-27 MED ORDER — ANASTROZOLE 1 MG PO TABS
1.0000 mg | ORAL_TABLET | Freq: Every day | ORAL | Status: DC
  Administered 2023-07-28: 1 mg via ORAL
  Filled 2023-07-27: qty 1

## 2023-07-27 MED ORDER — SODIUM CHLORIDE 0.9 % IR SOLN
Status: DC | PRN
  Administered 2023-07-27: 1000 mL

## 2023-07-27 MED ORDER — MENTHOL 3 MG MT LOZG: 1.0000 | LOZENGE | OROMUCOSAL | Status: DC | PRN

## 2023-07-27 MED ORDER — PHENYLEPHRINE HCL-NACL 20-0.9 MG/250ML-% IV SOLN
INTRAVENOUS | Status: DC | PRN
  Administered 2023-07-27: 40 ug/min via INTRAVENOUS

## 2023-07-27 MED ORDER — PHENOL 1.4 % MT LIQD: 1.0000 | OROMUCOSAL | Status: DC | PRN

## 2023-07-27 MED ORDER — DEXAMETHASONE SODIUM PHOSPHATE 10 MG/ML IJ SOLN
10.0000 mg | Freq: Once | INTRAMUSCULAR | Status: AC
  Administered 2023-07-28: 10 mg via INTRAVENOUS
  Filled 2023-07-27: qty 1

## 2023-07-27 MED ORDER — LISINOPRIL 20 MG PO TABS
20.0000 mg | ORAL_TABLET | Freq: Every day | ORAL | Status: DC
  Filled 2023-07-27: qty 1

## 2023-07-27 MED ORDER — 0.9 % SODIUM CHLORIDE (POUR BTL) OPTIME
TOPICAL | Status: DC | PRN
  Administered 2023-07-27: 1000 mL

## 2023-07-27 MED ORDER — FENTANYL CITRATE PF 50 MCG/ML IJ SOSY
PREFILLED_SYRINGE | INTRAMUSCULAR | Status: AC
  Administered 2023-07-27: 50 ug
  Filled 2023-07-27: qty 2

## 2023-07-27 MED ORDER — METHOCARBAMOL 1000 MG/10ML IJ SOLN: 500.0000 mg | Freq: Four times a day (QID) | INTRAMUSCULAR | Status: DC | PRN

## 2023-07-27 SURGICAL SUPPLY — 48 items
ATTUNE MED ANAT PAT 35 KNEE (Knees) IMPLANT
BAG COUNTER SPONGE SURGICOUNT (BAG) IMPLANT
BAG ZIPLOCK 12X15 (MISCELLANEOUS) IMPLANT
BASEPLATE TIB CMT FB PCKT SZ5 (Knees) IMPLANT
BLADE SAW SGTL 13.0X1.19X90.0M (BLADE) ×1 IMPLANT
BNDG ELASTIC 6INX 5YD STR LF (GAUZE/BANDAGES/DRESSINGS) ×1 IMPLANT
BOWL SMART MIX CTS (DISPOSABLE) ×1 IMPLANT
CEMENT HV SMART SET (Cement) ×2 IMPLANT
COMP FEM CMT ATTUNE NRW 5 RT (Joint) ×1 IMPLANT
COMPONENT FEM CMT ATTN NRW 5RT (Joint) IMPLANT
COOLER ICEMAN CLASSIC (MISCELLANEOUS) IMPLANT
COVER SURGICAL LIGHT HANDLE (MISCELLANEOUS) ×1 IMPLANT
CUFF TRNQT CYL 34X4.125X (TOURNIQUET CUFF) ×1 IMPLANT
DERMABOND ADVANCED .7 DNX12 (GAUZE/BANDAGES/DRESSINGS) ×1 IMPLANT
DRAPE U-SHAPE 47X51 STRL (DRAPES) ×1 IMPLANT
DRESSING AQUACEL AG SP 3.5X10 (GAUZE/BANDAGES/DRESSINGS) ×1 IMPLANT
DRSG AQUACEL AG ADV 3.5X10 (GAUZE/BANDAGES/DRESSINGS) IMPLANT
DRSG AQUACEL AG SP 3.5X10 (GAUZE/BANDAGES/DRESSINGS) ×1
DURAPREP 26ML APPLICATOR (WOUND CARE) ×2 IMPLANT
ELECT REM PT RETURN 15FT ADLT (MISCELLANEOUS) ×1 IMPLANT
GLOVE BIO SURGEON STRL SZ 6 (GLOVE) ×1 IMPLANT
GLOVE BIOGEL PI IND STRL 6.5 (GLOVE) ×1 IMPLANT
GLOVE BIOGEL PI IND STRL 7.5 (GLOVE) ×1 IMPLANT
GLOVE ORTHO TXT STRL SZ7.5 (GLOVE) ×2 IMPLANT
GOWN STRL REUS W/ TWL LRG LVL3 (GOWN DISPOSABLE) ×2 IMPLANT
HOLDER FOLEY CATH W/STRAP (MISCELLANEOUS) IMPLANT
INSERT TIB ATTUNE KNEE 5 RT (Insert) IMPLANT
KIT TURNOVER KIT A (KITS) IMPLANT
MANIFOLD NEPTUNE II (INSTRUMENTS) ×1 IMPLANT
NDL SAFETY ECLIPSE 18X1.5 (NEEDLE) IMPLANT
NS IRRIG 1000ML POUR BTL (IV SOLUTION) ×1 IMPLANT
PACK TOTAL KNEE CUSTOM (KITS) ×1 IMPLANT
PAD COLD SHLDR WRAP-ON (PAD) IMPLANT
PIN FIX SIGMA LCS THRD HI (PIN) IMPLANT
PROTECTOR NERVE ULNAR (MISCELLANEOUS) ×1 IMPLANT
SET HNDPC FAN SPRY TIP SCT (DISPOSABLE) ×1 IMPLANT
SET PAD KNEE POSITIONER (MISCELLANEOUS) ×1 IMPLANT
SPIKE FLUID TRANSFER (MISCELLANEOUS) ×2 IMPLANT
SUT MNCRL AB 4-0 PS2 18 (SUTURE) ×1 IMPLANT
SUT STRATAFIX PDS+ 0 24IN (SUTURE) ×1 IMPLANT
SUT VIC AB 1 CT1 36 (SUTURE) ×1 IMPLANT
SUT VIC AB 2-0 CT1 TAPERPNT 27 (SUTURE) ×2 IMPLANT
SYR 3ML LL SCALE MARK (SYRINGE) ×1 IMPLANT
TOWEL GREEN STERILE FF (TOWEL DISPOSABLE) ×1 IMPLANT
TRAY FOLEY MTR SLVR 16FR STAT (SET/KITS/TRAYS/PACK) ×1 IMPLANT
TUBE SUCTION HIGH CAP CLEAR NV (SUCTIONS) ×1 IMPLANT
WATER STERILE IRR 1000ML POUR (IV SOLUTION) ×2 IMPLANT
WRAP KNEE MAXI GEL POST OP (GAUZE/BANDAGES/DRESSINGS) ×1 IMPLANT

## 2023-07-27 NOTE — Discharge Instructions (Signed)

## 2023-07-27 NOTE — Evaluation (Signed)
Physical Therapy Evaluation Patient Details Name: Claire Bernard MRN: [id number] DOB: 08-10-1951 Today's Date: 07/27/2023  History of Present Illness  72 yo female s/p R TKA on 07/27/23. PMH: anxiety, breast CA, HTN, ankle fx  surgery  Clinical Impression  Pt is s/p TKA resulting in the deficits listed below (see PT Problem List).   Pt min/mod  assist to stand with RW. pt with bil knee buckling d/t slow regression of spinal/regional anesthesia (despite being able to SLR and contract glutes, 4hrs post anesthesia end). Assisted pt back to sitting EOB.  Pt motivated to be OOB for dinner and  performed lateral scoot to chair with CGA and cues for technique Anticipate steady progress once anesthesia resolved  Pt will benefit from acute skilled PT to increase their independence and safety with mobility to allow discharge.          If plan is discharge home, recommend the following: A little help with walking and/or transfers;A little help with bathing/dressing/bathroom;Assistance with cooking/housework;Assist for transportation;Help with stairs or ramp for entrance   Can travel by private vehicle        Equipment Recommendations Rolling walker (2 wheels)  Recommendations for Other Services       Functional Status Assessment Patient has had a recent decline in their functional status and demonstrates the ability to make significant improvements in function in a reasonable and predictable amount of time.     Precautions / Restrictions Precautions Precautions: Fall;Knee Restrictions Weight Bearing Restrictions: No Other Position/Activity Restrictions: WBAT      Mobility  Bed Mobility Overal bed mobility: Needs Assistance Bed Mobility: Supine to Sit     Supine to sit: Contact guard     General bed mobility comments: cues for task completion    Transfers Overall transfer level: Needs assistance Equipment used: Rolling walker (2 wheels) Transfers: Sit to/from Stand,  Bed to chair/wheelchair/BSC Sit to Stand: Min assist, Mod assist          Lateral/Scoot Transfers: Min assist, Contact guard assist General transfer comment: cues for safety and hand placement, assist to stand and transistion to RW. pt with bil knee buckling d/t slow regression of spinal/regional anesthesia. assist pt back to EOB. pt performed lateral scoot to chair with CGA and cues for technique    Ambulation/Gait                  Stairs            Wheelchair Mobility     Tilt Bed    Modified Rankin (Stroke Patients Only)       Balance                                             Pertinent Vitals/Pain Pain Assessment Pain Assessment: 0-10 Pain Score: 3  Pain Location: right knee Pain Descriptors / Indicators: Burning Pain Intervention(s): Limited activity within patient's tolerance, Monitored during session, Premedicated before session, Repositioned    Home Living Family/patient expects to be discharged to:: Private residence Living Arrangements: Spouse/significant other Available Help at Discharge: Available 24 hours/day;Family Type of Home: House Home Access: Stairs to enter   [employer] of Steps: 1   Home Layout: One level Home Equipment: None      Prior Function Prior Level of Function : Independent/Modified Independent  Extremity/Trunk Assessment   Upper Extremity Assessment Upper Extremity Assessment: Overall WFL for tasks assessed    Lower Extremity Assessment Lower Extremity Assessment: RLE deficits/detail RLE Deficits / Details: ankle WFL, knee extension and hip flexion ~ 10 degree quad lag; pt reported tingling plantar surface R foot;       Communication   Communication Communication: No apparent difficulties  Cognition Arousal: Alert Behavior During Therapy: WFL for tasks assessed/performed Overall Cognitive Status: Within Functional Limits for tasks assessed                                           General Comments      Exercises Total Joint Exercises Ankle Circles/Pumps: AROM, Both, 10 reps Quad Sets: 5 reps, Both, Strengthening, AROM Gluteal Sets: Both, 5 reps   Assessment/Plan    PT Assessment Patient needs continued PT services  PT Problem List Decreased strength;Decreased range of motion;Decreased activity tolerance;Decreased mobility;Decreased knowledge of precautions;Decreased knowledge of use of DME;Pain       PT Treatment Interventions DME instruction;Gait training;Stair training;Functional mobility training;Therapeutic activities;Therapeutic exercise;Patient/family education    PT Goals (Current goals can be found in the Care Plan section)  Acute Rehab PT Goals PT Goal Formulation: With patient Time For Goal Achievement: 08/03/23    Frequency 7X/week     Co-evaluation               AM-PAC PT "6 Clicks" Mobility  Outcome Measure Help needed turning from your back to your side while in a flat bed without using bedrails?: A Little Help needed moving from lying on your back to sitting on the side of a flat bed without using bedrails?: A Little Help needed moving to and from a bed to a chair (including a wheelchair)?: Total Help needed standing up from a chair using your arms (e.g., wheelchair or bedside chair)?: Total Help needed to walk in hospital room?: Total Help needed climbing 3-5 steps with a railing? : Total 6 Click Score: 10    End of Session Equipment Utilized During Treatment: Gait belt Activity Tolerance: Patient tolerated treatment well;Other (comment) (slow regression anesthesia)   Nurse Communication: Mobility status PT Visit Diagnosis: Other abnormalities of gait and mobility (R26.89);Difficulty in walking, not elsewhere classified (R26.2)    Time: 1610-9604 PT Time Calculation (min) (ACUTE ONLY): 31 min   Charges:   PT Evaluation $PT Eval Low Complexity: 1 Low PT  Treatments $Therapeutic Activity: 8-22 mins PT General Charges $$ ACUTE PT VISIT: 1 Visit         Mauricio Dahlen, PT  Acute Rehab Dept [facility]) [phone]  07/27/2023   [facility] 07/27/2023, 4:57 PM

## 2023-07-27 NOTE — Op Note (Signed)
NAME:  Claire Bernard [facility]                      MEDICAL RECORD NO.:  [id number]                             FACILITY:  [facility]      PHYSICIAN:  Madlyn Frankel. Charlann Boxer, M.D.  DATE OF BIRTH:  1950-12-06      DATE OF PROCEDURE:  07/27/2023                                     OPERATIVE REPORT         PREOPERATIVE DIAGNOSIS:  Right knee osteoarthritis.      POSTOPERATIVE DIAGNOSIS:  Right knee osteoarthritis.      FINDINGS:  The patient was noted to have complete loss of cartilage and   bone-on-bone arthritis with associated osteophytes in the lateral and patellofemoral compartments of   the knee with a significant synovitis and associated effusion.  The patient had failed months of conservative treatment including medications, injection therapy, activity modification.     PROCEDURE:  Right total knee replacement.      COMPONENTS USED:  DePuy Attune FB CR MS knee   system, a size 5N femur, 5 tibia, size 5 mm CR MS AOX insert, and 35 anatomic patellar   button.      SURGEON:  Madlyn Frankel. Charlann Boxer, M.D.      ASSISTANT:  Rosalene Billings, PA-C.      ANESTHESIA:  Regional and Spinal.      SPECIMENS:  None.      COMPLICATION:  None.      DRAINS:  None.  EBL: <100 cc      TOURNIQUET TIME:  26 min at 225 mmHg     The patient was stable to the recovery room.      INDICATION FOR PROCEDURE:  Claire Bernard is a 72 y.o. female patient of   mine.  The patient had been seen, evaluated, and treated for months conservatively in the   office with medication, activity modification, and injections.  The patient had   radiographic changes of bone-on-bone arthritis with endplate sclerosis and osteophytes noted.  Based on the radiographic changes and failed conservative measures, the patient   decided to proceed with definitive treatment, total knee replacement.  Risks of infection, DVT, component failure, need for revision surgery, neurovascular injury were reviewed in the office setting.  The postop  course was reviewed stressing the efforts to maximize post-operative satisfaction and function.  Consent was obtained for benefit of pain   relief.      PROCEDURE IN DETAIL:  The patient was brought to the operative theater.   Once adequate anesthesia, preoperative antibiotics, 2 gm of Ancef,1 gm of Tranexamic Acid, and 10 mg of Decadron administered, the patient was positioned supine with a right thigh tourniquet placed.  The  right lower extremity was prepped and draped in sterile fashion.  A time-   out was performed identifying the patient, planned procedure, and the appropriate extremity.      The right lower extremity was placed in the [facility] leg holder.  The leg was   exsanguinated, tourniquet elevated to 225 mmHg.  A midline incision was   made followed by median parapatellar arthrotomy.  Following initial   exposure, attention was  first directed to the patella.  Precut   measurement was noted to be 24 mm.  I resected down to 14 mm and used a   35 anatomic patellar button to restore patellar height as well as cover the cut surface.      The lug holes were drilled and a metal shim was placed to protect the   patella from retractors and saw blade during the procedure.      At this point, attention was now directed to the femur.  The femoral   canal was opened with a drill, irrigated to try to prevent fat emboli.  An   intramedullary rod was passed at 3 degrees valgus, 9 mm of bone was   resected off the distal femur.  Following this resection, the tibia was   subluxated anteriorly.  Using the extramedullary guide, 4 mm of bone was resected off   the proximal medial tibia.  We confirmed the gap would be   stable medially and laterally with a size 5 spacer block as well as confirmed that the tibial cut was perpendicular in the coronal plane, checking with an alignment rod.      Once this was done, I sized the femur to be a size 5 in the anterior-   posterior dimension, chose a narrow  component based on medial and   lateral dimension.  The size 5 rotation block was then pinned in   position anterior referenced using the C-clamp to set rotation.  The   anterior, posterior, and  chamfer cuts were made without difficulty nor   notching making certain that I was along the anterior cortex to help   with flexion gap stability.      The final box cut was made off the lateral aspect of distal femur.      At this point, the tibia was sized to be a size 5.  The size 5 tray was   then pinned in position through the medial third of the tubercle,   drilled, and keel punched.  Trial reduction was now carried with a 5 femur,  5 tibia, a size 5 mm CR MS insert, and the 35 anatomic patella botton.  The knee was brought to full extension with good flexion stability with the patella   tracking through the trochlea without application of pressure.  Given   all these findings the trial components removed.  Final components were   opened and cement was mixed.  The knee was irrigated with normal saline solution and pulse lavage.  The synovial lining was   then injected with 30 cc of 0.25% Marcaine with epinephrine, 1 cc of Toradol and 30 cc of NS for a total of 61 cc.     Final implants were then cemented onto cleaned and dried cut surfaces of bone with the knee brought to extension with a size 5 mm CR MS trial insert.      Once the cement had fully cured, excess cement was removed   throughout the knee.  I confirmed that I was satisfied with the range of   motion and stability, and the final size 5 mm CR MS AOX insert was chosen.  It was   placed into the knee.      The tourniquet had been let down at 26 minutes.  No significant   hemostasis was required.  The extensor mechanism was then reapproximated using #1 Vicryl and #1 Stratafix sutures with the knee   in flexion.  The   remaining wound was closed with 2-0 Vicryl and running 4-0 Monocryl.   The knee was cleaned, dried, dressed  sterilely using Dermabond and   Aquacel dressing.  The patient was then   brought to recovery room in stable condition, tolerating the procedure   well.   Please note that Physician Assistant, Rosalene Billings, PA-C was present for the entirety of the case, and was utilized for pre-operative positioning, peri-operative retractor management, general facilitation of the procedure and for primary wound closure at the end of the case.              Madlyn Frankel Charlann Boxer, M.D.    07/27/2023 9:49 AM

## 2023-07-27 NOTE — Anesthesia Postprocedure Evaluation (Signed)
Anesthesia Post Note  Patient: Claire Bernard  Procedure(s) Performed: TOTAL KNEE ARTHROPLASTY (Right: Knee)     Patient location during evaluation: PACU Anesthesia Type: Spinal Level of consciousness: oriented and awake and alert Pain management: pain level controlled Vital Signs Assessment: post-procedure vital signs reviewed and stable Respiratory status: spontaneous breathing, respiratory function stable and nonlabored ventilation Cardiovascular status: blood pressure returned to baseline and stable Postop Assessment: no headache, no backache, no apparent nausea or vomiting, spinal receding and patient able to bend at knees Anesthetic complications: no   No notable events documented.  Last Vitals:  Vitals:   07/27/23 1315 07/27/23 1330  BP: 93/63 (!) 101/50  Pulse: 83 73  Resp: 17 18  Temp:    SpO2: 100% 100%    Last Pain:  Vitals:   07/27/23 1330  TempSrc:   PainSc: 0-No pain                 Shannia Jacuinde A.

## 2023-07-27 NOTE — Anesthesia Preprocedure Evaluation (Addendum)
Anesthesia Evaluation  Patient identified by MRN, date of birth, ID band Patient awake    Reviewed: Allergy & Precautions, NPO status , Patient's Chart, lab work & pertinent test results  Airway Mallampati: II  TM Distance: >3 FB     Dental no notable dental hx. (+) Teeth Intact, Dental Advisory Given, Caps   Pulmonary former smoker   Pulmonary exam normal breath sounds clear to auscultation       Cardiovascular hypertension, Pt. on medications Normal cardiovascular exam Rhythm:Regular Rate:Normal     Neuro/Psych   Anxiety     Hx/o neuropathy  Neuromuscular disease    GI/Hepatic Neg liver ROS,GERD  Medicated,,  Endo/Other  Hx/o left breast Ca  S/P RT lumpectomy Obesity  Renal/GU negative Renal ROS  negative genitourinary   Musculoskeletal  (+) Arthritis , Osteoarthritis,  OA right knee   Abdominal   Peds  Hematology negative hematology ROS (+)   Anesthesia Other Findings   Reproductive/Obstetrics                             Anesthesia Physical Anesthesia Plan  ASA: 2  Anesthesia Plan: Spinal   Post-op Pain Management: Minimal or no pain anticipated and Regional block*   Induction: Intravenous  PONV Risk Score and Plan: 3 and Treatment may vary due to age or medical condition  Airway Management Planned: Natural Airway and Simple Face Mask  Additional Equipment: None  Intra-op Plan:   Post-operative Plan:   Informed Consent: I have reviewed the patients History and Physical, chart, labs and discussed the procedure including the risks, benefits and alternatives for the proposed anesthesia with the patient or authorized representative who has indicated his/her understanding and acceptance.     Dental advisory given  Plan Discussed with: CRNA and Anesthesiologist  Anesthesia Plan Comments:         Anesthesia Quick Evaluation

## 2023-07-27 NOTE — Anesthesia Procedure Notes (Addendum)
  Anesthesia Regional Block: Adductor canal block   Pre-Anesthetic Checklist: , timeout performed,  Correct Patient, Correct Site, Correct Laterality,  Correct Procedure, Correct Position, site marked,  Risks and benefits discussed,  Surgical consent,  Pre-op evaluation,  At surgeon's request and post-op pain management  Laterality: Right  Prep: chloraprep       Needles:  Injection technique: Single-shot  Needle Type: Echogenic Stimulator Needle     Needle Length: 10cm  Needle Gauge: 21   Needle insertion depth: 8 cm   Additional Needles:   Procedures:,,,, ultrasound used (permanent image in chart),,    Narrative:  Start time: 07/27/2023 10:18 AM End time: 07/27/2023 10:23 AM Injection made incrementally with aspirations every 5 mL.  Performed by: Personally  Anesthesiologist: Mal Amabile, MD  Additional Notes: Timeout performed. Patient sedated. Relevant anatomy ID'd using [place]. Incremental 2-55ml injection of LA with frequent aspiration. Patient tolerated procedure well.

## 2023-07-27 NOTE — Transfer of Care (Signed)
Immediate Anesthesia Transfer of Care Note  Patient: Claire Bernard  Procedure(s) Performed: TOTAL KNEE ARTHROPLASTY (Right: Knee)  Patient Location: PACU  Anesthesia Type:Spinal  Level of Consciousness: sedated, patient cooperative, and responds to stimulation  Airway & Oxygen Therapy: Patient Spontanous Breathing and Patient connected to face mask oxygen  Post-op Assessment: Report given to RN and Post -op Vital signs reviewed and stable  Post vital signs: Reviewed and stable  Last Vitals:  Vitals Value Taken Time  BP    Temp    Pulse 82 07/27/23 1248  Resp 17 07/27/23 1248  SpO2 100 % 07/27/23 1248  Vitals shown include unfiled device data.  Last Pain:  Vitals:   07/27/23 1040  TempSrc:   PainSc: 0-No pain         Complications: No notable events documented.

## 2023-07-27 NOTE — Interval H&P Note (Signed)
History and Physical Interval Note:  07/27/2023 9:49 AM  Claire Bernard  has presented today for surgery, with the diagnosis of Right knee osteoarthritis.  The various methods of treatment have been discussed with the patient and family. After consideration of risks, benefits and other options for treatment, the patient has consented to  Procedure(s): TOTAL KNEE ARTHROPLASTY (Right) as a surgical intervention.  The patient's history has been reviewed, patient examined, no change in status, stable for surgery.  I have reviewed the patient's chart and labs.  Questions were answered to the patient's satisfaction.     Shelda Pal

## 2023-07-28 ENCOUNTER — Encounter (HOSPITAL_COMMUNITY): Payer: Self-pay | Admitting: Orthopedic Surgery

## 2023-07-28 DIAGNOSIS — M1711 Unilateral primary osteoarthritis, right knee: Secondary | ICD-10-CM | POA: Diagnosis not present

## 2023-07-28 LAB — CBC
HCT: 34.3 % — ABNORMAL LOW (ref 36.0–46.0)
Hemoglobin: 11.8 g/dL — ABNORMAL LOW (ref 12.0–15.0)
MCH: 31.6 pg (ref 26.0–34.0)
MCHC: 34.4 g/dL (ref 30.0–36.0)
MCV: 91.7 fL (ref 80.0–100.0)
Platelets: 230 10*3/uL (ref 150–400)
RBC: 3.74 MIL/uL — ABNORMAL LOW (ref 3.87–5.11)
RDW: 12.9 % (ref 11.5–15.5)
WBC: 14.4 10*3/uL — ABNORMAL HIGH (ref 4.0–10.5)
nRBC: 0 % (ref 0.0–0.2)

## 2023-07-28 LAB — BASIC METABOLIC PANEL
Anion gap: 10 (ref 5–15)
BUN: 20 mg/dL (ref 8–23)
CO2: 24 mmol/L (ref 22–32)
Calcium: 9.6 mg/dL (ref 8.9–10.3)
Chloride: 102 mmol/L (ref 98–111)
Creatinine, Ser: 0.86 mg/dL (ref 0.44–1.00)
GFR, Estimated: >60 mL/min (ref >60)
Glucose, Bld: 164 mg/dL — ABNORMAL HIGH (ref 70–99)
Potassium: 3.7 mmol/L (ref 3.5–5.1)
Sodium: 136 mmol/L (ref 135–145)

## 2023-07-28 MED ORDER — SENNA 8.6 MG PO TABS: 2.0000 | ORAL_TABLET | Freq: Every day | ORAL | 0 refills | Status: AC

## 2023-07-28 MED ORDER — METHOCARBAMOL 500 MG PO TABS: 500.0000 mg | ORAL_TABLET | Freq: Four times a day (QID) | ORAL | 2 refills | Status: AC | PRN

## 2023-07-28 MED ORDER — ASPIRIN 81 MG PO CHEW: 81.0000 mg | CHEWABLE_TABLET | Freq: Two times a day (BID) | ORAL | 0 refills | Status: AC

## 2023-07-28 MED ORDER — OXYCODONE HCL 5 MG PO TABS: 5.0000 mg | ORAL_TABLET | ORAL | 0 refills | Status: AC | PRN

## 2023-07-28 MED ORDER — POLYETHYLENE GLYCOL 3350 17 G PO PACK: 17.0000 g | PACK | Freq: Two times a day (BID) | ORAL | 0 refills | Status: AC

## 2023-07-28 NOTE — Progress Notes (Signed)
Orthopedic Tech Progress Note Patient Details:  Claire Bernard [birth year] [id number]  Ortho Devices Type of Ortho Device: Knee Immobilizer Ortho Device/Splint Location: right Ortho Device/Splint Interventions: Avie Echevaria 07/28/2023, 10:09 AM

## 2023-07-28 NOTE — TOC Transition Note (Signed)
Transition of Care [facility]) - CM/SW Discharge Note   Patient Details  Name: Claire Bernard MRN: [id number] Date of Birth: Apr 13, 1951  [facility] CM/SW Contact:  Amada Jupiter, LCSW Phone Number: 07/28/2023, 9:59 AM   Clinical Narrative:     Met with pt who confirms need for RW and no DME agency preference. Order placed with [employer] and RW delivered to room.  OPPT already set up with [facility].  No further TOC needs.  Final next level of care: OP Rehab Barriers to Discharge: No Barriers Identified   Patient Goals and CMS Choice      Discharge Placement                         Discharge Plan and Services Additional resources added to the After Visit Summary for                  DME Arranged: Walker rolling DME Agency: [employer] Date DME Agency Contacted: 07/28/23 Time DME Agency Contacted: [phone] Representative spoke with at DME Agency: Yvonna Alanis            Social Determinants of Health (SDOH) Interventions SDOH Screenings   Food Insecurity: No Food Insecurity (07/27/2023)  Housing: Low Risk  (07/27/2023)  Transportation Needs: No Transportation Needs (07/27/2023)  Utilities: Not At Risk (07/27/2023)  Financial Resource Strain: Low Risk  (11/26/2021)  Social Connections: Unknown (03/18/2022)   Received from [facility]  Tobacco Use: Medium Risk (07/27/2023)     Readmission Risk Interventions     No data to display

## 2023-07-28 NOTE — Progress Notes (Signed)
Subjective: 1 Day Post-Op Procedure(s) (LRB): TOTAL KNEE ARTHROPLASTY (Right) Patient reports pain as mild.   Patient seen in rounds with Dr. Charlann Boxer. Patient is well, and has had no acute complaints or problems. No acute events overnight. Foley catheter removed. Patient wasn't able to get up with PT due to residual spinal effects.  We will start therapy today.   Objective: Vital signs in last 24 hours: Temp:  [96.8 F (36 C)-98.2 F (36.8 C)] 98 F (36.7 C) (11/27 0704) Pulse Rate:  [73-91] 73 (11/27 0704) Resp:  [7-19] 16 (11/27 0704) BP: (88-131)/(50-104) 114/92 (11/27 0704) SpO2:  [85 %-100 %] 97 % (11/27 0704) Weight:  [84.8 kg] 84.8 kg (11/26 0902)  Intake/Output from previous day:  Intake/Output Summary (Last 24 hours) at 07/28/2023 0801 Last data filed at 07/28/2023 0648 Gross per 24 hour  Intake 401.76 ml  Output 1550 ml  Net -1148.24 ml     Intake/Output this shift: No intake/output data recorded.  Labs: Recent Labs    07/28/23 0359  HGB 11.8*   Recent Labs    07/28/23 0359  WBC 14.4*  RBC 3.74*  HCT 34.3*  PLT 230   Recent Labs    07/28/23 0359  NA 136  K 3.7  CL 102  CO2 24  BUN 20  CREATININE 0.86  GLUCOSE 164*  CALCIUM 9.6   No results for input(s): "LABPT", "INR" in the last 72 hours.  Exam: General - Patient is Alert and Oriented Extremity - Neurologically intact Sensation intact distally Intact pulses distally Dorsiflexion/Plantar flexion intact Dressing - dressing C/D/I Motor Function - intact, moving foot and toes well on exam.   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   Weight as of this encounter: 84.8 kg. Advance diet Up with therapy D/C IV fluids   Patient's anticipated LOS is less than 2 midnights, meeting these requirements: - Younger than 64 - Lives within 1 hour of care - Has a competent adult at home to recover with post-op recover - NO history of  - Chronic pain requiring opiods  - Diabetes  - Coronary Artery Disease  - Heart failure  - Heart attack  - Stroke  - DVT/VTE  - Cardiac arrhythmia  - Respiratory Failure/COPD  - Renal failure  - Anemia  - Advanced Liver disease     DVT Prophylaxis - Aspirin Weight bearing as tolerated.  Hgb stable at 11.8 this AM.  Plan is to go Home after hospital stay. Plan for discharge today following 1-2 sessions of PT as long as they are meeting their goals. Patient is scheduled for OPPT. Follow up in the office  in 2 weeks.   Rosalene Billings, PA-C Orthopedic Surgery [phone] 07/28/2023, 8:01 AM

## 2023-07-28 NOTE — Progress Notes (Signed)
Physical Therapy Treatment Patient Details Name: Claire Bernard MRN: [id number] DOB: [birth year] Today's Date: 07/28/2023   History of Present Illness 72 yo female s/p R TKA on 07/27/23. PMH: anxiety, breast CA, HTN, ankle fx  surgery    PT Comments  Pt very motivated however remains ltd by residual effects of adductor block. Pt able to SLR and perform LAQs however R knee buckling with WBing and KI on place, will see again this pm. Pt is hopeful to d/c later today. RN aware   If plan is discharge home, recommend the following: A little help with walking and/or transfers;A little help with bathing/dressing/bathroom;Assistance with cooking/housework;Assist for transportation;Help with stairs or ramp for entrance   Can travel by private vehicle        Equipment Recommendations  Rolling walker (2 wheels)    Recommendations for Other Services       Precautions / Restrictions Precautions Precautions: Fall;Knee Precaution Comments: pt with decr quad activation, R knee buckling with WBing--KI placed R LE Restrictions Weight Bearing Restrictions: No Other Position/Activity Restrictions: WBAT     Mobility  Bed Mobility Overal bed mobility: Needs Assistance Bed Mobility: Supine to Sit     Supine to sit: Supervision     General bed mobility comments: cues for task completion    Transfers Overall transfer level: Needs assistance Equipment used: Rolling walker (2 wheels) Transfers: Sit to/from Stand Sit to Stand: Min assist           General transfer comment: cues for hand placement and RLE position    Ambulation/Gait Ambulation/Gait assistance: Min assist Gait Distance (Feet): 20 Feet Assistive device: Rolling walker (2 wheels) Gait Pattern/deviations: Step-to pattern       General Gait Details: cues for sequence, step to pattern, use of UEs to off load RLE d/t knee buckling with WBing even with KI in place   Stairs             Wheelchair Mobility      Tilt Bed    Modified Rankin (Stroke Patients Only)       Balance                                            Cognition Arousal: Alert Behavior During Therapy: WFL for tasks assessed/performed Overall Cognitive Status: Within Functional Limits for tasks assessed                                          Exercises Total Joint Exercises Ankle Circles/Pumps: AROM, Both, 10 reps Quad Sets: 5 reps, Both, Strengthening, AROM    General Comments        Pertinent Vitals/Pain Pain Assessment Pain Assessment: 0-10 Pain Score: 3  Pain Location: right knee Pain Descriptors / Indicators: Burning, Aching Pain Intervention(s): Limited activity within patient's tolerance, Monitored during session, Premedicated before session, Repositioned    Home Living                          Prior Function            PT Goals (current goals can now be found in the care plan section) Acute Rehab PT Goals PT Goal Formulation: With patient Time For Goal Achievement: 08/03/23 Progress towards PT goals:  Progressing toward goals    Frequency    7X/week      PT Plan      Co-evaluation              AM-PAC PT "6 Clicks" Mobility   Outcome Measure  Help needed turning from your back to your side while in a flat bed without using bedrails?: A Little Help needed moving from lying on your back to sitting on the side of a flat bed without using bedrails?: A Little Help needed moving to and from a bed to a chair (including a wheelchair)?: A Little Help needed standing up from a chair using your arms (e.g., wheelchair or bedside chair)?: A Little Help needed to walk in hospital room?: A Little Help needed climbing 3-5 steps with a railing? : Total 6 Click Score: 16    End of Session Equipment Utilized During Treatment: Gait belt;Right knee immobilizer Activity Tolerance: Patient tolerated treatment well;Other (comment) (ltd by residual  effects of anesthesia) Patient left: in chair;with call bell/phone within reach;with chair alarm set;with family/visitor present Nurse Communication: Mobility status PT Visit Diagnosis: Other abnormalities of gait and mobility (R26.89);Difficulty in walking, not elsewhere classified (R26.2)     Time: 5956-3875 PT Time Calculation (min) (ACUTE ONLY): 27 min  Charges:    $Gait Training: 23-37 mins PT General Charges $$ ACUTE PT VISIT: 1 Visit                     Beyla Loney, PT  Acute Rehab Dept [facility]) [phone]  07/28/2023    [facility] 07/28/2023, 10:36 AM

## 2023-07-28 NOTE — Care Management Obs Status (Signed)
MEDICARE OBSERVATION STATUS NOTIFICATION   Patient Details  Name: Claire Bernard MRN: [id number] Date of Birth: 1951-04-05   Medicare Observation Status Notification Given:       Amada Jupiter, LCSW 07/28/2023, 1:33 PM

## 2023-07-28 NOTE — Progress Notes (Signed)
Physical Therapy Treatment Patient Details Name: Claire Bernard MRN: [id number] DOB: 05/14/1951 Today's Date: 07/28/2023   History of Present Illness 72 yo female s/p R TKA on 07/27/23. PMH: anxiety, breast CA, HTN, ankle fx  surgery    PT Comments  Pt progressing well this session. Quad activation improved, KI for safety, no knee buckling noted during session, husband present and supportive, able to assist as needed. Recommend hands on/CGA initially at home. Pt tends to let of RW, discussed this is a fall risk. Pt is meeting PT goals. Pt and spouse feel ready to d/c home.     If plan is discharge home, recommend the following: A little help with walking and/or transfers;A little help with bathing/dressing/bathroom;Assistance with cooking/housework;Assist for transportation;Help with stairs or ramp for entrance   Can travel by private vehicle        Equipment Recommendations  Rolling walker (2 wheels)    Recommendations for Other Services       Precautions / Restrictions Precautions Precautions: Fall;Knee Precaution Comments: WBing--KI placed R LE Restrictions Weight Bearing Restrictions: No Other Position/Activity Restrictions: WBAT     Mobility  Bed Mobility Overal bed mobility: Needs Assistance Bed Mobility: Supine to Sit     Supine to sit: Supervision     General bed mobility comments: cues for task completion    Transfers Overall transfer level: Needs assistance Equipment used: Rolling walker (2 wheels) Transfers: Sit to/from Stand Sit to Stand: Contact guard assist, Min assist           General transfer comment: verbal cues for hand placement and safety    Ambulation/Gait Ambulation/Gait assistance: Contact guard assist Gait Distance (Feet): 60 Feet Assistive device: Rolling walker (2 wheels) Gait Pattern/deviations: Step-to pattern       General Gait Details: cues for sequence, step to pattern, KI in place, improved quad activation and no  buckling noted   Stairs Stairs: Yes Stairs assistance: Contact guard assist, Min assist Stair Management: No rails, Step to pattern, Backwards, With walker Number of Stairs: 1 General stair comments: cues for sequence and safety. posterior technique used for safety  d/t decr quad activation (likely residual effects of adductor block). husband present and able to assist   Wheelchair Mobility     Tilt Bed    Modified Rankin (Stroke Patients Only)       Balance                                            Cognition Arousal: Alert Behavior During Therapy: WFL for tasks assessed/performed Overall Cognitive Status: Within Functional Limits for tasks assessed                                 General Comments: husband present for session        Exercises Total Joint Exercises Ankle Circles/Pumps: AROM, Both, 10 reps Quad Sets: 5 reps, Both, Strengthening, AROM Heel Slides: AROM, Right, 5 reps Straight Leg Raises: AROM, Right, 5 reps    General Comments        Pertinent Vitals/Pain Pain Assessment Pain Assessment: 0-10 Pain Score: 4  Pain Location: right knee Pain Descriptors / Indicators: Burning, Aching Pain Intervention(s): Limited activity within patient's tolerance, Monitored during session, Premedicated before session, Repositioned    Home Living  Prior Function            PT Goals (current goals can now be found in the care plan section) Acute Rehab PT Goals PT Goal Formulation: With patient Time For Goal Achievement: 08/03/23 Progress towards PT goals: Progressing toward goals    Frequency    7X/week      PT Plan      Co-evaluation              AM-PAC PT "6 Clicks" Mobility   Outcome Measure  Help needed turning from your back to your side while in a flat bed without using bedrails?: A Little Help needed moving from lying on your back to sitting on the side of a flat  bed without using bedrails?: A Little Help needed moving to and from a bed to a chair (including a wheelchair)?: A Little Help needed standing up from a chair using your arms (e.g., wheelchair or bedside chair)?: A Little Help needed to walk in hospital room?: A Little Help needed climbing 3-5 steps with a railing? : A Little 6 Click Score: 18    End of Session Equipment Utilized During Treatment: Gait belt;Right knee immobilizer Activity Tolerance: Patient tolerated treatment well;Other (comment) Patient left: in chair;with call bell/phone within reach;with chair alarm set;with family/visitor present Nurse Communication: Mobility status PT Visit Diagnosis: Other abnormalities of gait and mobility (R26.89);Difficulty in walking, not elsewhere classified (R26.2)     Time: 8295-6213 PT Time Calculation (min) (ACUTE ONLY): 35 min  Charges:    $Gait Training: 23-37 mins PT General Charges $$ ACUTE PT VISIT: 1 Visit                     Kaidyn Hernandes, PT  Acute Rehab Dept [facility] [phone]  07/28/2023    [facility] 07/28/2023, 2:54 PM

## 2023-08-05 NOTE — Discharge Summary (Signed)
Patient ID: Claire Bernard MRN: [id number] DOB/AGE: Sep 27, 1950 73 y.o.  Admit date: 07/27/2023 Discharge date: 07/28/2023  Admission Diagnoses:  Right knee osteoarthritis  Discharge Diagnoses:  Principal Problem:   S/P total knee arthroplasty, right   Past Medical History:  Diagnosis Date   Anxiety    panic attacks   Cancer (HCC) 07/2004   breast cancer   Chest pressure    Clotting disorder (HCC) 1970's   from birthcontrol    Esophageal spasm    Fatigue    GERD (gastroesophageal reflux disease)    History of dizziness    Hyperlipidemia    Hypertension    Laryngitis    Low bone mass    Neuromuscular disorder (HCC)    neuropathy in both feet   Personal history of radiation therapy 07/2004   left breast   Pre-diabetes     Surgeries: Procedure(s): TOTAL KNEE ARTHROPLASTY on 07/27/2023   Consultants:   Discharged Condition: Improved  Hospital Course: Claire Bernard is an 72 y.o. female who was admitted 07/27/2023 for operative treatment ofS/P total knee arthroplasty, right. Patient has severe unremitting pain that affects sleep, daily activities, and work/hobbies. After pre-op clearance the patient was taken to the operating room on 07/27/2023 and underwent  Procedure(s): TOTAL KNEE ARTHROPLASTY.    Patient was given perioperative antibiotics:  Anti-infectives (From admission, onward)    Start     Dose/Rate Route Frequency Ordered Stop   07/27/23 1800  ceFAZolin (ANCEF) IVPB 2g/100 mL premix        2 g 200 mL/hr over 30 Minutes Intravenous Every 6 hours 07/27/23 1522 07/28/23 1308   07/27/23 0900  ceFAZolin (ANCEF) IVPB 2g/100 mL premix        2 g 200 mL/hr over 30 Minutes Intravenous On call to O.R. 07/27/23 0859 07/27/23 1125        Patient was given sequential compression devices, early ambulation, and chemoprophylaxis to prevent DVT. Patient worked with PT and was meeting their goals regarding safe ambulation and transfers.  Patient benefited  maximally from hospital stay and there were no complications.    Recent vital signs: No data found.   Recent laboratory studies: No results for input(s): "WBC", "HGB", "HCT", "PLT", "NA", "K", "CL", "CO2", "BUN", "CREATININE", "GLUCOSE", "INR", "CALCIUM" in the last 72 hours.  Invalid input(s): "PT", "2"   Discharge Medications:   Allergies as of 07/28/2023       Reactions   Other Rash   Tide & Gain Detergent        Medication List     STOP taking these medications    naproxen 500 MG tablet Commonly known as: NAPROSYN   Vitamin D-3 125 MCG (5000 UT) Tabs       TAKE these medications    anastrozole 1 MG tablet Commonly known as: ARIMIDEX TAKE 1 TABLET BY MOUTH DAILY   aspirin 81 MG chewable tablet Chew 1 tablet (81 mg total) by mouth 2 (two) times daily for 28 days.   busPIRone 5 MG tablet Commonly known as: BUSPAR Take 5 mg by mouth 2 (two) times daily.   cetirizine 10 MG tablet Commonly known as: ZYRTEC Take 10 mg by mouth daily.   hydrochlorothiazide 25 MG tablet Commonly known as: HYDRODIURIL Take 25 mg by mouth every morning.   lisinopril 20 MG tablet Commonly known as: ZESTRIL Take 20 mg by mouth at bedtime.   methocarbamol 500 MG tablet Commonly known as: ROBAXIN Take 1 tablet (500 mg total) by mouth  every 6 (six) hours as needed for muscle spasms.   nitrofurantoin (macrocrystal-monohydrate) 100 MG capsule Commonly known as: MACROBID Take 100 mg by mouth 2 (two) times daily.   oxyCODONE 5 MG immediate release tablet Commonly known as: Oxy IR/ROXICODONE Take 1 tablet (5 mg total) by mouth every 4 (four) hours as needed for severe pain (pain score 7-10).   polyethylene glycol 17 g packet Commonly known as: MIRALAX / GLYCOLAX Take 17 g by mouth 2 (two) times daily.   Refresh Tears 0.5 % Soln Generic drug: carboxymethylcellulose Place 1 drop into both eyes daily as needed (dry eyes).   senna 8.6 MG Tabs tablet Commonly known as:  SENOKOT Take 2 tablets (17.2 mg total) by mouth at bedtime for 14 days.   traZODone 150 MG tablet Commonly known as: DESYREL Take 150 mg by mouth at bedtime.               Discharge Care Instructions  (From admission, onward)           Start     Ordered   07/28/23 0000  Change dressing       Comments: Maintain surgical dressing until follow up in the clinic. If the edges start to pull up, may reinforce with tape. If the dressing is no longer working, may remove and cover with gauze and tape, but must keep the area dry and clean.  Call with any questions or concerns.   07/28/23 0804            Diagnostic Studies: No results found.  Disposition: Discharge disposition: 01-Home or Self Care       Discharge Instructions     Call MD / Call 911   Complete by: As directed    If you experience chest pain or shortness of breath, CALL 911 and be transported to the hospital emergency room.  If you develope a fever above 101 F, pus (white drainage) or increased drainage or redness at the wound, or calf pain, call your surgeon's office.   Change dressing   Complete by: As directed    Maintain surgical dressing until follow up in the clinic. If the edges start to pull up, may reinforce with tape. If the dressing is no longer working, may remove and cover with gauze and tape, but must keep the area dry and clean.  Call with any questions or concerns.   Constipation Prevention   Complete by: As directed    Drink plenty of fluids.  Prune juice may be helpful.  You may use a stool softener, such as Colace (over the counter) 100 mg twice a day.  Use MiraLax (over the counter) for constipation as needed.   Diet - low sodium heart healthy   Complete by: As directed    Increase activity slowly as tolerated   Complete by: As directed    Weight bearing as tolerated with assist device (walker, cane, etc) as directed, use it as long as suggested by your surgeon or therapist, typically  at least 4-6 weeks.   Post-operative opioid taper instructions:   Complete by: As directed    POST-OPERATIVE OPIOID TAPER INSTRUCTIONS: It is important to wean off of your opioid medication as soon as possible. If you do not need pain medication after your surgery it is ok to stop day one. Opioids include: Codeine, Hydrocodone(Norco, Vicodin), Oxycodone(Percocet, oxycontin) and hydromorphone amongst others.  Long term and even short term use of opiods can cause: Increased pain response Dependence Constipation  Depression Respiratory depression And more.  Withdrawal symptoms can include Flu like symptoms Nausea, vomiting And more Techniques to manage these symptoms Hydrate well Eat regular healthy meals Stay active Use relaxation techniques(deep breathing, meditating, yoga) Do Not substitute Alcohol to help with tapering If you have been on opioids for less than two weeks and do not have pain than it is ok to stop all together.  Plan to wean off of opioids This plan should start within one week post op of your joint replacement. Maintain the same interval or time between taking each dose and first decrease the dose.  Cut the total daily intake of opioids by one tablet each day Next start to increase the time between doses. The last dose that should be eliminated is the evening dose.      TED hose   Complete by: As directed    Use stockings (TED hose) for 2 weeks on both leg(s).  You may remove them at night for sleeping.        Follow-up Information     Durene Romans, MD. Schedule an appointment as soon as possible for a visit in 2 week(s).   Specialty: Orthopedic Surgery Contact information: [address] [place] [address] [phone]                  Signed: Cassandria Anger 08/05/2023, 7:21 AM

## 2023-09-06 ENCOUNTER — Ambulatory Visit: Payer: Medicare Other

## 2023-09-10 ENCOUNTER — Inpatient Hospital Stay: Payer: Medicare Other | Attending: Hematology and Oncology | Admitting: Hematology and Oncology

## 2023-09-10 VITALS — BP 128/63 | HR 85 | Temp 98.4°F | Resp 17 | Wt 188.1 lb

## 2023-09-10 DIAGNOSIS — R7303 Prediabetes: Secondary | ICD-10-CM | POA: Insufficient documentation

## 2023-09-10 DIAGNOSIS — Z800 Family history of malignant neoplasm of digestive organs: Secondary | ICD-10-CM | POA: Diagnosis not present

## 2023-09-10 DIAGNOSIS — Z170 Estrogen receptor positive status [ER+]: Secondary | ICD-10-CM | POA: Insufficient documentation

## 2023-09-10 DIAGNOSIS — Z801 Family history of malignant neoplasm of trachea, bronchus and lung: Secondary | ICD-10-CM | POA: Diagnosis not present

## 2023-09-10 DIAGNOSIS — Z79811 Long term (current) use of aromatase inhibitors: Secondary | ICD-10-CM | POA: Insufficient documentation

## 2023-09-10 DIAGNOSIS — C50411 Malignant neoplasm of upper-outer quadrant of right female breast: Secondary | ICD-10-CM | POA: Insufficient documentation

## 2023-09-10 DIAGNOSIS — M8580 Other specified disorders of bone density and structure, unspecified site: Secondary | ICD-10-CM | POA: Diagnosis not present

## 2023-09-10 DIAGNOSIS — Z803 Family history of malignant neoplasm of breast: Secondary | ICD-10-CM | POA: Diagnosis not present

## 2023-09-10 DIAGNOSIS — Z79899 Other long term (current) drug therapy: Secondary | ICD-10-CM | POA: Diagnosis not present

## 2023-09-10 DIAGNOSIS — Z87891 Personal history of nicotine dependence: Secondary | ICD-10-CM | POA: Diagnosis not present

## 2023-09-10 DIAGNOSIS — Z96651 Presence of right artificial knee joint: Secondary | ICD-10-CM | POA: Insufficient documentation

## 2023-09-10 NOTE — Progress Notes (Signed)
 BRIEF ONCOLOGIC HISTORY:  Oncology History  Malignant neoplasm of upper-outer quadrant of right breast in female, estrogen receptor positive (HCC)  10/10/2021 Imaging   Screening mammogram showed a possible mass in the right breast.  Diagnostic mammogram confirmed an irregular isoechoic mass in the outer breast at the middle depth measuring just over 1 cm associated with architectural distortion.  No associated suspicious calcifications.  Targeted ultrasound was performed, demonstrating an irregular hypoechoic mass with vague margins at the 9 o'clock position 4 cm from nipple at middle depth measuring approximately 1.1 x 0.9 x 1 cm demonstrating posterior acoustic shadowing and no internal power Doppler flow corresponding to the screening mammographic finding.  Right axilla demonstrated no pathologic lymphadenopathy. Ultrasound of the area showed highly suspicious 1.1 cm mass involving the outer right breast at 9 o'clock position 4 cm from the nipple.  No pathologic right axillary lymphadenopathy.    11/18/2021 Pathology Results   Right breast needle core biopsy 9 o'clock position showed invasive carcinoma of no special type, grade 2, 11 mm in greatest length, ER 100% positive strong staining intensity, PR 100% positive strong staining intensity, Ki-67 of 40% and HER2 negative by IHC.    Genetic Testing   [facility] CancerNext-Expanded Panel was Negative. 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 Report date is 12/15/2021.  The CancerNext-Expanded gene panel offered by [facility] and includes sequencing, rearrangement, and RNA analysis for the following 77 genes: AIP, ALK, APC, ATM, AXIN2, BAP1, BARD1, BLM, BMPR1A, BRCA1, BRCA2, BRIP1, CDC73, CDH1, CDK4, CDKN1B, CDKN2A, CHEK2, CTNNA1, DICER1, FANCC, FH, FLCN, GALNT12, KIF1B, LZTR1, MAX, MEN1, MET, MLH1, MSH2, MSH3, MSH6, MUTYH, NBN, NF1, NF2, NTHL1, PALB2, PHOX2B, PMS2, POT1, PRKAR1A, PTCH1, PTEN, RAD51C, RAD51D,  RB1, RECQL, RET, SDHA, SDHAF2, SDHB, SDHC, SDHD, SMAD4, SMARCA4, SMARCB1, SMARCE1, STK11, SUFU, TMEM127, TP53, TSC1, TSC2, VHL and XRCC2 (sequencing and deletion/duplication); EGFR, EGLN1, HOXB13, KIT, MITF, PDGFRA, POLD1, and POLE (sequencing only); EPCAM and GREM1 (deletion/duplication only).    11/26/2021 Cancer Staging   Staging form: Breast, AJCC 8th Edition - Clinical stage from 11/26/2021: Stage IA (cT1c, cN0, cM0, G2, ER+, PR+, HER2-) - Signed by Loretha Ash, MD on 09/10/2023 Stage prefix: Initial diagnosis Histologic grading system: 3 grade system Laterality: Right Staged by: Pathologist and managing physician Stage used in treatment planning: Yes National guidelines used in treatment planning: Yes Type of national guideline used in treatment planning: NCCN   12/15/2021 Surgery   She had right breast lumpectomy with sentinel lymph node biopsy.  Pathology showed invasive ductal carcinoma, 1.7 cm, grade 3 along with high-grade DCIS, resection margins are negative for carcinoma sentinel lymph node negative.  Previous prognostic showed ER 100% positive strong staining, PR 100% positive strong staining, KI however at 40%, HER2 negative.    12/29/2021 Oncotype testing   Oncotype at 15, distant recurrence risk at 9 years of 4% and absolute chemotherapy benefit less than 1%.   01/28/2022 - 02/24/2022 Radiation Therapy   Site Technique Total Dose (Gy) Dose per Fx (Gy) Completed Fx Beam Energies  Breast, Right: Breast_R 3D 40.05/40.05 2.67 15/15 10X  Breast, Right: Breast_R_Bst 3D 10/10 2 5/5 6X     02/2022 -  Anti-estrogen oral therapy   Anastrozole      INTERVAL HISTORY:   Discussed the use of AI scribe software for clinical note transcription with the patient, who gave verbal consent to proceed.  History of Present Illness     The patient, with a history of breast  cancer and osteopenia, presents for a post-operative follow-up six weeks after a total knee replacement. She reports  significant improvement in mobility and pain control with Tylenol . She completed physical therapy last week and continues exercises at home. She describes the knee as feeling 'tight,' which was explained by her orthopedic surgeon as a normal part of the healing process. She also mentions a history of meniscus damage in the nineties, which required arthroscopic surgery.  The patient also has a history of breast cancer, currently managed with anastrozole . She reports occasional hot flashes, but these are infrequent and not bothersome. She is due for a mammogram in March.  She also mentions a recent urinary tract infection, for which she was prescribed SMZ-TMP pending culture results. She has been taking low-dose aspirin  since her knee surgery, but is unsure if she should continue this medication. Rest of the pertinent 10 point ROS reviewed and neg.   REVIEW OF SYSTEMS:  Review of Systems  Constitutional:  Negative for appetite change, chills, fatigue, fever and unexpected weight change.  HENT:   Negative for hearing loss, lump/mass and trouble swallowing.   Eyes:  Negative for eye problems and icterus.  Respiratory:  Negative for chest tightness, cough and shortness of breath.   Cardiovascular:  Negative for chest pain, leg swelling and palpitations.  Gastrointestinal:  Negative for abdominal distention, abdominal pain, constipation, diarrhea, nausea and vomiting.  Endocrine: Negative for hot flashes.  Genitourinary:  Negative for difficulty urinating.   Musculoskeletal:  Negative for arthralgias.  Skin:  Negative for itching and rash.  Neurological:  Negative for dizziness, extremity weakness, headaches and numbness.  Hematological:  Negative for adenopathy. Does not bruise/bleed easily.  Psychiatric/Behavioral:  Negative for depression. The patient is not nervous/anxious.   Breast: Denies any new nodularity, masses, tenderness, nipple changes, or nipple discharge.    PAST MEDICAL/SURGICAL  HISTORY:  Past Medical History:  Diagnosis Date   Anxiety    panic attacks   Cancer (HCC) 07/2004   breast cancer   Chest pressure    Clotting disorder (HCC) 1970's   from birthcontrol    Esophageal spasm    Fatigue    GERD (gastroesophageal reflux disease)    History of dizziness    Hyperlipidemia    Hypertension    Laryngitis    Low bone mass    Neuromuscular disorder (HCC)    neuropathy in both feet   Personal history of radiation therapy 07/2004   left breast   Pre-diabetes    Past Surgical History:  Procedure Laterality Date   ANKLE FRACTURE SURGERY     BREAST LUMPECTOMY Left 07/17/2004   left breast   BREAST LUMPECTOMY Right 11/2021   BREAST LUMPECTOMY WITH RADIOACTIVE SEED AND SENTINEL LYMPH NODE BIOPSY Right 12/15/2021   Procedure: RIGHT BREAST LUMPECTOMY WITH RADIOACTIVE SEED AND SENTINEL LYMPH NODE BIOPSY;  Surgeon: Vernetta Berg, MD;  Location: [facility];  Service: General;  Laterality: Right;   cataract surg     KNEE SURGERY     Arthroscopic   SHOULDER SURGERY     TOTAL KNEE ARTHROPLASTY Right 07/27/2023   Procedure: TOTAL KNEE ARTHROPLASTY;  Surgeon: Ernie Cough, MD;  Location: [facility];  Service: Orthopedics;  Laterality: Right;   VAGINAL HYSTERECTOMY       ALLERGIES:  Allergies  Allergen Reactions   Other Rash    Tide & Gain Detergent     CURRENT MEDICATIONS:  Outpatient Encounter Medications as of 09/10/2023  Medication Sig   anastrozole  (  ARIMIDEX ) 1 MG tablet TAKE 1 TABLET BY MOUTH DAILY   busPIRone  (BUSPAR ) 5 MG tablet Take 5 mg by mouth 2 (two) times daily.   carboxymethylcellulose (REFRESH TEARS) 0.5 % SOLN Place 1 drop into both eyes daily as needed (dry eyes).   cetirizine (ZYRTEC) 10 MG tablet Take 10 mg by mouth daily.   hydrochlorothiazide  (HYDRODIURIL ) 25 MG tablet Take 25 mg by mouth every morning.   lisinopril  (ZESTRIL ) 20 MG tablet Take 20 mg by mouth at bedtime.   methocarbamol  (ROBAXIN ) 500 MG tablet Take 1  tablet (500 mg total) by mouth every 6 (six) hours as needed for muscle spasms.   nitrofurantoin, macrocrystal-monohydrate, (MACROBID) 100 MG capsule Take 100 mg by mouth 2 (two) times daily.   oxyCODONE  (OXY IR/ROXICODONE ) 5 MG immediate release tablet Take 1 tablet (5 mg total) by mouth every 4 (four) hours as needed for severe pain (pain score 7-10).   polyethylene glycol (MIRALAX  / GLYCOLAX ) 17 g packet Take 17 g by mouth 2 (two) times daily.   traZODone  (DESYREL ) 150 MG tablet Take 150 mg by mouth at bedtime.   No facility-administered encounter medications on file as of 09/10/2023.     ONCOLOGIC FAMILY HISTORY:  Family History  Problem Relation Age of Onset   Atrial fibrillation Father    Emphysema Father    Heart attack Father        multiple   Heart disease Father    Hypertension Sister    Stroke Sister        x6 strokes is 1 y/o   Asthma Sister    Diabetes Sister    Depression Brother    Heart disease Brother    Coronary artery disease Brother        x3 CABG   Atrial fibrillation Maternal Grandmother    Diabetes Maternal Grandmother    Heart disease Maternal Grandmother    Hypertension Maternal Grandmother    Atrial fibrillation Maternal Grandfather    Heart disease Maternal Grandfather    Hypertension Maternal Grandfather    Breast cancer Paternal Grandmother        dx. 82s   Cancer Paternal Grandmother        uterine or ovarian, unsure if breast cancer metastasis or second primary   Heart disease Paternal Grandfather    Hypertension Paternal Grandfather    Atrial fibrillation Paternal Grandfather    Breast cancer Paternal Aunt        dx. 32s   Colon cancer Paternal Aunt    Colon cancer Paternal Uncle        dx. 50s   Lung cancer Paternal Uncle      SOCIAL HISTORY:  Social History   Socioeconomic History   Marital status: Married    Spouse name: Not on file   Number of children: 2   Years of education: Not on file   Highest education level: Not on  file  Occupational History    Employer: [id number]  Tobacco Use   Smoking status: Former    Current packs/day: 0.00    Average packs/day: 0.5 packs/day for 20.0 years (10.0 ttl pk-yrs)    Types: Cigarettes    Start date: 08/31/1970    Quit date: 08/31/1990    Years since quitting: 33.0   Smokeless tobacco: Never  Vaping Use   Vaping status: Never Used  Substance and Sexual Activity   Alcohol use: Yes    Alcohol/week: 0.0 standard drinks of alcohol    Comment: Rare  Drug use: No   Sexual activity: Yes    Birth control/protection: Surgical, Post-menopausal    Comment: 1st intercourse 73 yo-Fewer than 5 partners  Other Topics Concern   Not on file  Social History Narrative   Not on file   Social Drivers of Health   Financial Resource Strain: Low Risk  (11/26/2021)   Overall Financial Resource Strain (CARDIA)    Difficulty of Paying Living Expenses: Not hard at all  Food Insecurity: No Food Insecurity (07/27/2023)   Hunger Vital Sign    Worried About Running Out of Food in the Last Year: Never true    Ran Out of Food in the Last Year: Never true  Transportation Needs: No Transportation Needs (07/27/2023)   PRAPARE - Administrator, Civil Service (Medical): No    Lack of Transportation (Non-Medical): No  Physical Activity: Not on file  Stress: Not on file  Social Connections: Unknown (03/18/2022)   [facility]   Social Network    Social Network: Not on file  Intimate Partner Violence: Not At Risk (07/27/2023)   Humiliation, Afraid, Rape, and Kick questionnaire    Fear of Current or Ex-Partner: No    Emotionally Abused: No    Physically Abused: No    Sexually Abused: No     OBSERVATIONS/OBJECTIVE:  BP 128/63 (BP Location: Left Arm, Patient Position: Sitting)   Pulse 85   Temp 98.4 F (36.9 C) (Temporal)   Resp 17   Wt 188 lb 1.6 oz (85.3 kg)   SpO2 100%   BMI 30.83 kg/m  GENERAL: Patient is a well appearing female in no  acute distress Breasts: Bilateral breasts inspected and palpated.  Left breast smaller than right breast due to postsurgical changes.  No concerns otherwise.  No regional adenopathy.  LABORATORY DATA:  None for this visit.  DIAGNOSTIC IMAGING:  None for this visit.      ASSESSMENT AND PLAN:   Malignant neoplasm of upper-outer quadrant of right breast in female, estrogen receptor positive (HCC) This is a very pleasant 73 year old female patient with newly diagnosed right breast invasive ductal carcinoma, grade 2, 11 mm on ultrasound, prognostics ER 100% positive strong staining, PR 100% positive strong staining, Ki-67 of 40% and HER2 negative by IHC (0).  Given small tumor, strong ER/PR positivity, we have discussed about upfront surgery followed by Oncotype testing.  Oncotype score resulted at 15, distant risk of recurrence at 9 years is about 4% with AI or tamoxifen and absolute chemotherapy benefit less than 1% in this group.  No role for adjuvant chemotherapy.  She is now status post adjuvant radiation and is on anastrozole .  Total Knee Replacement Six weeks post-operative with good progress in physical therapy. Reports tightness in the knee and some limitations in activities. Pain controlled with Tylenol . -Continue home exercises and Tylenol  as needed for pain.  Breast Cancer On Anastrozole  with no reported side effects. No new concerns on physical exam. -Continue Anastrozole  as prescribed. -Schedule mammogram for March 2025.  Osteopenia Mild osteopenia noted on previous bone density scan. -Continue Vitamin D supplementation. -Resume weight-bearing exercises as tolerated post knee surgery. -Repeat bone density scan in 2026.  Urinary Tract Infection Recently started on SMZ-TMP pending culture results. -Continue SMZ-TMP as prescribed by primary care physician.  Prediabetes Weight loss from 195 lbs to 182 lbs prior to knee surgery, some weight regain  post-operatively. -Encourage continued weight loss efforts to reduce prediabetes risk.  Follow-up in 6 months.   *  Total Encounter Time as defined by the Centers for Medicare and Medicaid Services includes, in addition to the face-to-face time of a patient visit (documented in the note above) non-face-to-face time: obtaining and reviewing outside history, ordering and reviewing medications, tests or procedures, care coordination (communications with other health care professionals or caregivers) and documentation in the medical record.

## 2023-09-10 NOTE — Assessment & Plan Note (Signed)
 This is a very pleasant 73 year old female patient with newly diagnosed right breast invasive ductal carcinoma, grade 2, 11 mm on ultrasound, prognostics ER 100% positive strong staining, PR 100% positive strong staining, Ki-67 of 40% and HER2 negative by IHC (0).  Given small tumor, strong ER/PR positivity, we have discussed about upfront surgery followed by Oncotype testing.  Oncotype score resulted at 15, distant risk of recurrence at 9 years is about 4% with AI or tamoxifen and absolute chemotherapy benefit less than 1% in this group.  No role for adjuvant chemotherapy.  She is now status post adjuvant radiation and is on anastrozole .  Total Knee Replacement Six weeks post-operative with good progress in physical therapy. Reports tightness in the knee and some limitations in activities. Pain controlled with Tylenol . -Continue home exercises and Tylenol  as needed for pain.  Breast Cancer On Anastrozole  with no reported side effects. No new concerns on physical exam. -Continue Anastrozole  as prescribed. -Schedule mammogram for March 2025.  Osteopenia Mild osteopenia noted on previous bone density scan. -Continue Vitamin D supplementation. -Resume weight-bearing exercises as tolerated post knee surgery. -Repeat bone density scan in 2026.  Urinary Tract Infection Recently started on SMZ-TMP pending culture results. -Continue SMZ-TMP as prescribed by primary care physician.  Prediabetes Weight loss from 195 lbs to 182 lbs prior to knee surgery, some weight regain post-operatively. -Encourage continued weight loss efforts to reduce prediabetes risk.  Follow-up in 6 months.

## 2023-09-13 ENCOUNTER — Ambulatory Visit: Payer: Medicare Other | Attending: Surgery

## 2023-09-13 VITALS — Wt 186.0 lb

## 2023-09-13 DIAGNOSIS — Z483 Aftercare following surgery for neoplasm: Secondary | ICD-10-CM | POA: Insufficient documentation

## 2023-09-13 NOTE — Therapy (Signed)
 OUTPATIENT PHYSICAL THERAPY SOZO SCREENING NOTE   Patient Name: Claire Bernard MRN: [id number] DOB:08-28-1951, 73 y.o., female Today's Date: 09/13/2023  PCP: Loring Tanda Mae, MD REFERRING PROVIDER: Vernetta Berg, MD   PT End of Session - 09/13/23 1545     Visit Number 7   # unchanged due to screen only   PT Start Time 1543    PT Stop Time 1548    PT Time Calculation (min) 5 min    Activity Tolerance Patient tolerated treatment well    Behavior During Therapy [facility] for tasks assessed/performed             Past Medical History:  Diagnosis Date   Anxiety    panic attacks   Cancer (HCC) 07/2004   breast cancer   Chest pressure    Clotting disorder (HCC) 1970's   from birthcontrol    Esophageal spasm    Fatigue    GERD (gastroesophageal reflux disease)    History of dizziness    Hyperlipidemia    Hypertension    Laryngitis    Low bone mass    Neuromuscular disorder (HCC)    neuropathy in both feet   Personal history of radiation therapy 07/2004   left breast   Pre-diabetes    Past Surgical History:  Procedure Laterality Date   ANKLE FRACTURE SURGERY     BREAST LUMPECTOMY Left 07/17/2004   left breast   BREAST LUMPECTOMY Right 11/2021   BREAST LUMPECTOMY WITH RADIOACTIVE SEED AND SENTINEL LYMPH NODE BIOPSY Right 12/15/2021   Procedure: RIGHT BREAST LUMPECTOMY WITH RADIOACTIVE SEED AND SENTINEL LYMPH NODE BIOPSY;  Surgeon: Vernetta Berg, MD;  Location: [facility];  Service: General;  Laterality: Right;   cataract surg     KNEE SURGERY     Arthroscopic   SHOULDER SURGERY     TOTAL KNEE ARTHROPLASTY Right 07/27/2023   Procedure: TOTAL KNEE ARTHROPLASTY;  Surgeon: Ernie Cough, MD;  Location: [facility];  Service: Orthopedics;  Laterality: Right;   VAGINAL HYSTERECTOMY     Patient Active Problem List   Diagnosis Date Noted   S/P total knee arthroplasty, right 07/27/2023   Vaginal atrophy 12/08/2021   History of osteopenia  12/08/2021   Genetic testing 12/04/2021   Malignant neoplasm of upper-outer quadrant of right breast in female, estrogen receptor positive (HCC) 11/24/2021   History of ductal carcinoma in situ (DCIS) of breast 10/04/2019   Low bone mass    Hypertension    Esophageal spasm     REFERRING DIAG: right breast cancer at risk for lymphedema  THERAPY DIAG: Aftercare following surgery for neoplasm  PERTINENT HISTORY:  Patient was diagnosed on 10/30/2021 with right grade II invasive ductal carcinoma breast cancer. She underwent a right lumpectomy and sentinel node biopsy (1 negative node) on 12/15/2021 It is ER/PR positive and HER2 negative with a Ki67 of 40%. She had left breast cancer in 2005 which was treated with surgery, radiation, and anti-estrogen therapy. No axillary nodes removed per her report.   PRECAUTIONS: right UE Lymphedema risk,   SUBJECTIVE: Pt returns for her 3 month L-Dex screen. I had my Rt TKR about 6 weeks ago.  PAIN:  Are you having pain? No  SOZO SCREENING: Patient was assessed today using the SOZO machine to determine the lymphedema index score. This was compared to her baseline score. It was determined that she is NOT within the recommended range when compared to her baseline. She still has her compression sleeve from last time  she tested high. It is recommended she return in 1 month to be reassessed (though this will be a little longer due to upcoming TKR). If she continues to measure outside the recommended range, physical therapy treatment will be recommended at that time and a referral requested.    L-DEX FLOWSHEETS - 09/13/23 1500       L-DEX LYMPHEDEMA SCREENING   Measurement Type Unilateral    L-DEX MEASUREMENT EXTREMITY Upper Extremity    POSITION  Standing    DOMINANT SIDE Right    At Risk Side Right    BASELINE SCORE (UNILATERAL) -5.4    L-DEX SCORE (UNILATERAL) -1.3    VALUE CHANGE (UNILAT) 4.1               Aden Berwyn Caldron,  PTA 09/13/2023, 3:46 PM

## 2023-11-05 ENCOUNTER — Ambulatory Visit
Admission: RE | Admit: 2023-11-05 | Discharge: 2023-11-05 | Disposition: A | Discharge status: Home / Self Care | Payer: Medicare Other | Source: Ambulatory Visit | Attending: Hematology and Oncology | Admitting: Hematology and Oncology

## 2023-11-05 DIAGNOSIS — C50411 Malignant neoplasm of upper-outer quadrant of right female breast: Secondary | ICD-10-CM

## 2023-12-13 ENCOUNTER — Ambulatory Visit: Payer: Medicare Other | Attending: Surgery

## 2023-12-13 VITALS — Wt 188.1 lb

## 2023-12-13 DIAGNOSIS — Z483 Aftercare following surgery for neoplasm: Secondary | ICD-10-CM | POA: Insufficient documentation

## 2023-12-13 NOTE — Therapy (Signed)
 OUTPATIENT PHYSICAL THERAPY SOZO SCREENING NOTE   Patient Name: Claire Bernard MRN: [id number] DOB:23-Feb-1951, 73 y.o., female Today's Date: 12/13/2023  PCP: Kaleen Mask, MD REFERRING PROVIDER: Abigail Miyamoto, MD   PT End of Session - 12/13/23 [phone]     Visit Number 7   # unchanged due to screen only   PT Start Time 0925    PT Stop Time 0929    PT Time Calculation (min) 4 min    Activity Tolerance Patient tolerated treatment well    Behavior During Therapy [facility] for tasks assessed/performed             Past Medical History:  Diagnosis Date   Anxiety    panic attacks   Cancer (HCC) 07/2004   breast cancer   Chest pressure    Clotting disorder (HCC) 1970's   from birthcontrol    Esophageal spasm    Fatigue    GERD (gastroesophageal reflux disease)    History of dizziness    Hyperlipidemia    Hypertension    Laryngitis    Low bone mass    Neuromuscular disorder (HCC)    neuropathy in both feet   Personal history of radiation therapy 07/2004   left breast   Pre-diabetes    Past Surgical History:  Procedure Laterality Date   ANKLE FRACTURE SURGERY     BREAST LUMPECTOMY Left 07/17/2004   left breast   BREAST LUMPECTOMY Right 11/2021   BREAST LUMPECTOMY WITH RADIOACTIVE SEED AND SENTINEL LYMPH NODE BIOPSY Right 12/15/2021   Procedure: RIGHT BREAST LUMPECTOMY WITH RADIOACTIVE SEED AND SENTINEL LYMPH NODE BIOPSY;  Surgeon: Abigail Miyamoto, MD;  Location: [facility];  Service: General;  Laterality: Right;   cataract surg     KNEE SURGERY     Arthroscopic   SHOULDER SURGERY     TOTAL KNEE ARTHROPLASTY Right 07/27/2023   Procedure: TOTAL KNEE ARTHROPLASTY;  Surgeon: Durene Romans, MD;  Location: [facility];  Service: Orthopedics;  Laterality: Right;   VAGINAL HYSTERECTOMY     Patient Active Problem List   Diagnosis Date Noted   S/P total knee arthroplasty, right 07/27/2023   Vaginal atrophy 12/08/2021   History of osteopenia  12/08/2021   Genetic testing 12/04/2021   Malignant neoplasm of upper-outer quadrant of right breast in female, estrogen receptor positive (HCC) 11/24/2021   History of ductal carcinoma in situ (DCIS) of breast 10/04/2019   Low bone mass    Hypertension    Esophageal spasm     REFERRING DIAG: right breast cancer at risk for lymphedema  THERAPY DIAG: Aftercare following surgery for neoplasm  PERTINENT HISTORY:  Patient was diagnosed on 10/30/2021 with right grade II invasive ductal carcinoma breast cancer. She underwent a right lumpectomy and sentinel node biopsy (1 negative node) on 12/15/2021 It is ER/PR positive and HER2 negative with a Ki67 of 40%. She had left breast cancer in 2005 which was treated with surgery, radiation, and anti-estrogen therapy. No axillary nodes removed per her report.   PRECAUTIONS: right UE Lymphedema risk,   SUBJECTIVE: Pt returns for her 3 month L-Dex screen. "I've been wearing my sleeve when I vacuum or switch hands."  PAIN:  Are you having pain? No  SOZO SCREENING: Patient was assessed today using the SOZO machine to determine the lymphedema index score. This was compared to her baseline score. It was determined that she is within the recommended range when compared to her baseline and no further action is needed at this  time. She will continue SOZO screenings. These are done every 3 months for 2 years post operatively followed by every 6 months for 2 years, and then annually.     L-DEX FLOWSHEETS - 12/13/23 0900       L-DEX LYMPHEDEMA SCREENING   Measurement Type Unilateral    L-DEX MEASUREMENT EXTREMITY Upper Extremity    POSITION  Standing    DOMINANT SIDE Right    At Risk Side Right    BASELINE SCORE (UNILATERAL) -5.4    L-DEX SCORE (UNILATERAL) -1.2    VALUE CHANGE (UNILAT) 4.2               Denyce Flank, PTA 12/13/2023, 9:28 AM

## 2024-02-22 ENCOUNTER — Telehealth: Payer: Self-pay | Admitting: Hematology and Oncology

## 2024-02-22 NOTE — Telephone Encounter (Signed)
 left vm for pt about rescheduled appt date and time

## 2024-03-02 LAB — COLOGUARD: COLOGUARD: NEGATIVE

## 2024-03-09 ENCOUNTER — Ambulatory Visit: Payer: Medicare Other | Admitting: Hematology and Oncology

## 2024-03-13 ENCOUNTER — Ambulatory Visit: Attending: Surgery

## 2024-03-13 VITALS — Wt 194.5 lb

## 2024-03-13 DIAGNOSIS — Z483 Aftercare following surgery for neoplasm: Secondary | ICD-10-CM | POA: Insufficient documentation

## 2024-03-13 NOTE — Therapy (Signed)
 OUTPATIENT PHYSICAL THERAPY SOZO SCREENING NOTE   Patient Name: Claire Bernard MRN: [id number] DOB:1951/04/14, 73 y.o., female Today's Date: 03/13/2024  PCP: Loring Tanda Mae, MD REFERRING PROVIDER: Vernetta Berg, MD   PT End of Session - 03/13/24 1009     Visit Number 7   # unchanged due to screen only   PT Start Time 1008    PT Stop Time 1012    PT Time Calculation (min) 4 min    Activity Tolerance Patient tolerated treatment well    Behavior During Therapy [facility] for tasks assessed/performed          Past Medical History:  Diagnosis Date   Anxiety    panic attacks   Cancer (HCC) 07/2004   breast cancer   Chest pressure    Clotting disorder (HCC) 1970's   from birthcontrol    Esophageal spasm    Fatigue    GERD (gastroesophageal reflux disease)    History of dizziness    Hyperlipidemia    Hypertension    Laryngitis    Low bone mass    Neuromuscular disorder (HCC)    neuropathy in both feet   Personal history of radiation therapy 07/2004   left breast   Pre-diabetes    Past Surgical History:  Procedure Laterality Date   ANKLE FRACTURE SURGERY     BREAST LUMPECTOMY Left 07/17/2004   left breast   BREAST LUMPECTOMY Right 11/2021   BREAST LUMPECTOMY WITH RADIOACTIVE SEED AND SENTINEL LYMPH NODE BIOPSY Right 12/15/2021   Procedure: RIGHT BREAST LUMPECTOMY WITH RADIOACTIVE SEED AND SENTINEL LYMPH NODE BIOPSY;  Surgeon: Vernetta Berg, MD;  Location: [facility];  Service: General;  Laterality: Right;   cataract surg     KNEE SURGERY     Arthroscopic   SHOULDER SURGERY     TOTAL KNEE ARTHROPLASTY Right 07/27/2023   Procedure: TOTAL KNEE ARTHROPLASTY;  Surgeon: Ernie Cough, MD;  Location: [facility];  Service: Orthopedics;  Laterality: Right;   VAGINAL HYSTERECTOMY     Patient Active Problem List   Diagnosis Date Noted   S/P total knee arthroplasty, right 07/27/2023   Vaginal atrophy 12/08/2021   History of osteopenia 12/08/2021    Genetic testing 12/04/2021   Malignant neoplasm of upper-outer quadrant of right breast in female, estrogen receptor positive (HCC) 11/24/2021   History of ductal carcinoma in situ (DCIS) of breast 10/04/2019   Low bone mass    Hypertension    Esophageal spasm     REFERRING DIAG: right breast cancer at risk for lymphedema  THERAPY DIAG: Aftercare following surgery for neoplasm  PERTINENT HISTORY:  Patient was diagnosed on 10/30/2021 with right grade II invasive ductal carcinoma breast cancer. She underwent a right lumpectomy and sentinel node biopsy (1 negative node) on 12/15/2021 It is ER/PR positive and HER2 negative with a Ki67 of 40%. She had left breast cancer in 2005 which was treated with surgery, radiation, and anti-estrogen therapy. No axillary nodes removed per her report.   PRECAUTIONS: right UE Lymphedema risk,   SUBJECTIVE: Pt returns for her 3 month L-Dex screen.   PAIN:  Are you having pain? No  SOZO SCREENING: Patient was assessed today using the SOZO machine to determine the lymphedema index score. This was compared to her baseline score. It was determined that she is within the recommended range when compared to her baseline and no further action is needed at this time. She will continue SOZO screenings. These are done every 3 months for  2 years post operatively followed by every 6 months for 2 years, and then annually.     L-DEX FLOWSHEETS - 03/13/24 1000       L-DEX LYMPHEDEMA SCREENING   Measurement Type Unilateral    L-DEX MEASUREMENT EXTREMITY Upper Extremity    POSITION  Standing    DOMINANT SIDE Right    At Risk Side Right    BASELINE SCORE (UNILATERAL) -5.4    L-DEX SCORE (UNILATERAL) -1.4    VALUE CHANGE (UNILAT) 4            Aden Berwyn Caldron, PTA 03/13/2024, 10:10 AM

## 2024-03-24 ENCOUNTER — Telehealth: Payer: Self-pay

## 2024-03-27 ENCOUNTER — Telehealth: Payer: Self-pay | Admitting: Hematology and Oncology

## 2024-03-27 ENCOUNTER — Inpatient Hospital Stay: Attending: Hematology and Oncology | Admitting: Hematology and Oncology

## 2024-03-27 ENCOUNTER — Encounter: Payer: Self-pay | Admitting: Hematology and Oncology

## 2024-03-27 VITALS — BP 123/65 | HR 82 | Temp 98.3°F | Resp 18 | Wt 193.5 lb

## 2024-03-27 DIAGNOSIS — C50411 Malignant neoplasm of upper-outer quadrant of right female breast: Secondary | ICD-10-CM | POA: Insufficient documentation

## 2024-03-27 DIAGNOSIS — Z1732 Human epidermal growth factor receptor 2 negative status: Secondary | ICD-10-CM | POA: Diagnosis not present

## 2024-03-27 DIAGNOSIS — Z800 Family history of malignant neoplasm of digestive organs: Secondary | ICD-10-CM | POA: Insufficient documentation

## 2024-03-27 DIAGNOSIS — Z801 Family history of malignant neoplasm of trachea, bronchus and lung: Secondary | ICD-10-CM | POA: Insufficient documentation

## 2024-03-27 DIAGNOSIS — Z803 Family history of malignant neoplasm of breast: Secondary | ICD-10-CM | POA: Insufficient documentation

## 2024-03-27 DIAGNOSIS — Z87891 Personal history of nicotine dependence: Secondary | ICD-10-CM | POA: Diagnosis not present

## 2024-03-27 DIAGNOSIS — Z79899 Other long term (current) drug therapy: Secondary | ICD-10-CM | POA: Diagnosis not present

## 2024-03-27 DIAGNOSIS — Z1721 Progesterone receptor positive status: Secondary | ICD-10-CM | POA: Diagnosis not present

## 2024-03-27 DIAGNOSIS — R252 Cramp and spasm: Secondary | ICD-10-CM | POA: Insufficient documentation

## 2024-03-27 DIAGNOSIS — Z170 Estrogen receptor positive status [ER+]: Secondary | ICD-10-CM | POA: Diagnosis not present

## 2024-03-27 DIAGNOSIS — Z79811 Long term (current) use of aromatase inhibitors: Secondary | ICD-10-CM | POA: Diagnosis not present

## 2024-03-27 DIAGNOSIS — M8580 Other specified disorders of bone density and structure, unspecified site: Secondary | ICD-10-CM | POA: Diagnosis not present

## 2024-03-27 DIAGNOSIS — N951 Menopausal and female climacteric states: Secondary | ICD-10-CM | POA: Insufficient documentation

## 2024-03-27 NOTE — Assessment & Plan Note (Addendum)
 Assessment and Plan Assessment & Plan Breast cancer She is on adj anastrozole  and vitamin D3. Mammogram results satisfactory. - Continue anastrozole  and vitamin D3. - Follow up in 6 months.  Osteopenia Osteopenia with improved T-score of -1.4. No progression to osteoporosis. - Encourage walking 30 minutes a day, 5 days a week. - Continue vitamin D supplementation. - Monitor bone density every 2 years.  Hot flashes Intermittent nocturnal hot flashes. Encourage drinking 1.5 to 2 liters of water  daily.  Leg cramps Intermittent leg and ankle cramps. Magnesium supplementation improved sleep and muscle relaxation.  Memory concerns Reported memory concerns. Recent testing showed no abnormalities. Baseline established. - Obtain printout of recent test results from primary care physician for records.

## 2024-03-27 NOTE — Progress Notes (Signed)
 BRIEF ONCOLOGIC HISTORY:  Oncology History  Malignant neoplasm of upper-outer quadrant of right breast in female, estrogen receptor positive (HCC)  10/10/2021 Imaging   Screening mammogram showed a possible mass in the right breast.  Diagnostic mammogram confirmed an irregular isoechoic mass in the outer breast at the middle depth measuring just over 1 cm associated with architectural distortion.  No associated suspicious calcifications.  Targeted ultrasound was performed, demonstrating an irregular hypoechoic mass with vague margins at the 9 o'clock position 4 cm from nipple at middle depth measuring approximately 1.1 x 0.9 x 1 cm demonstrating posterior acoustic shadowing and no internal power Doppler flow corresponding to the screening mammographic finding.  Right axilla demonstrated no pathologic lymphadenopathy. Ultrasound of the area showed highly suspicious 1.1 cm mass involving the outer right breast at 9 o'clock position 4 cm from the nipple.  No pathologic right axillary lymphadenopathy.    11/18/2021 Pathology Results   Right breast needle core biopsy 9 o'clock position showed invasive carcinoma of no special type, grade 2, 11 mm in greatest length, ER 100% positive strong staining intensity, PR 100% positive strong staining intensity, Ki-67 of 40% and HER2 negative by IHC.    Genetic Testing   [facility] CancerNext-Expanded Panel was Negative. 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 Report date is 12/15/2021.  The CancerNext-Expanded gene panel offered by [facility] and includes sequencing, rearrangement, and RNA analysis for the following 77 genes: AIP, ALK, APC, ATM, AXIN2, BAP1, BARD1, BLM, BMPR1A, BRCA1, BRCA2, BRIP1, CDC73, CDH1, CDK4, CDKN1B, CDKN2A, CHEK2, CTNNA1, DICER1, FANCC, FH, FLCN, GALNT12, KIF1B, LZTR1, MAX, MEN1, MET, MLH1, MSH2, MSH3, MSH6, MUTYH, NBN, NF1, NF2, NTHL1, PALB2, PHOX2B, PMS2, POT1, PRKAR1A, PTCH1, PTEN, RAD51C, RAD51D,  RB1, RECQL, RET, SDHA, SDHAF2, SDHB, SDHC, SDHD, SMAD4, SMARCA4, SMARCB1, SMARCE1, STK11, SUFU, TMEM127, TP53, TSC1, TSC2, VHL and XRCC2 (sequencing and deletion/duplication); EGFR, EGLN1, HOXB13, KIT, MITF, PDGFRA, POLD1, and POLE (sequencing only); EPCAM and GREM1 (deletion/duplication only).    11/26/2021 Cancer Staging   Staging form: Breast, AJCC 8th Edition - Clinical stage from 11/26/2021: Stage IA (cT1c, cN0, cM0, G2, ER+, PR+, HER2-) - Signed by Loretha Ash, MD on 09/10/2023 Stage prefix: Initial diagnosis Histologic grading system: 3 grade system Laterality: Right Staged by: Pathologist and managing physician Stage used in treatment planning: Yes National guidelines used in treatment planning: Yes Type of national guideline used in treatment planning: NCCN   12/15/2021 Surgery   She had right breast lumpectomy with sentinel lymph node biopsy.  Pathology showed invasive ductal carcinoma, 1.7 cm, grade 3 along with high-grade DCIS, resection margins are negative for carcinoma sentinel lymph node negative.  Previous prognostic showed ER 100% positive strong staining, PR 100% positive strong staining, KI however at 40%, HER2 negative.    12/29/2021 Oncotype testing   Oncotype at 15, distant recurrence risk at 9 years of 4% and absolute chemotherapy benefit less than 1%.   01/28/2022 - 02/24/2022 Radiation Therapy   Site Technique Total Dose (Gy) Dose per Fx (Gy) Completed Fx Beam Energies  Breast, Right: Breast_R 3D 40.05/40.05 2.67 15/15 10X  Breast, Right: Breast_R_Bst 3D 10/10 2 5/5 6X     02/2022 -  Anti-estrogen oral therapy   Anastrozole      INTERVAL HISTORY:   Discussed the use of AI scribe software for clinical note transcription with the patient, who gave verbal consent to proceed.  History of Present Illness    Discussed the use of AI scribe software for  clinical note transcription with the patient, who gave verbal consent to proceed.  History of Present  Illness Claire Bernard is a 73 year old female with a history of breast cancer who presents for a follow-up visit.  She experiences hot flashes primarily at night, which she manages with a sleeping pill. She sleeps about five hours before waking up to use the bathroom and struggles to return to sleep, sometimes reading if unable to fall back asleep within an hour.  She is currently taking anastrozole  with vitamin D3 and reports hot flashes as a side effect. She also takes magnesium glycinate gummies, started a month ago for leg and ankle cramps, which occur frequently. Her gynecologist recommended magnesium for relief. She has not noticed any recent swelling in her ankles, although they have been swollen in the past.  Two weeks ago, she had a wellness visit where her primary care physician conducted a comprehensive set of tests, including a memory assessment, due to concerns about her memory. All tests returned normal results. She also had a SOZO test two weeks ago.  She discusses her water  intake, stating she drinks a lot of coffee and struggles to drink plain water , often adding flavoring to make it more palatable. She is trying to increase her water  intake to help with her symptoms.  Family history reveals her daughter, aged 72, was recently diagnosed with DCIS, the same condition she had at a similar age. Her daughter has also undergone knee and gallbladder surgeries and is experiencing high blood pressure, for which she is on her fourth medication.  Breast: Denies any new nodularity, masses, tenderness, nipple changes, or nipple discharge.    PAST MEDICAL/SURGICAL HISTORY:  Past Medical History:  Diagnosis Date   Anxiety    panic attacks   Cancer (HCC) 07/2004   breast cancer   Chest pressure    Clotting disorder (HCC) 1970's   from birthcontrol    Esophageal spasm    Fatigue    GERD (gastroesophageal reflux disease)    History of dizziness    Hyperlipidemia    Hypertension     Laryngitis    Low bone mass    Neuromuscular disorder (HCC)    neuropathy in both feet   Personal history of radiation therapy 07/2004   left breast   Pre-diabetes    Past Surgical History:  Procedure Laterality Date   ANKLE FRACTURE SURGERY     BREAST LUMPECTOMY Left 07/17/2004   left breast   BREAST LUMPECTOMY Right 11/2021   BREAST LUMPECTOMY WITH RADIOACTIVE SEED AND SENTINEL LYMPH NODE BIOPSY Right 12/15/2021   Procedure: RIGHT BREAST LUMPECTOMY WITH RADIOACTIVE SEED AND SENTINEL LYMPH NODE BIOPSY;  Surgeon: Vernetta Berg, MD;  Location: [facility];  Service: General;  Laterality: Right;   cataract surg     KNEE SURGERY     Arthroscopic   SHOULDER SURGERY     TOTAL KNEE ARTHROPLASTY Right 07/27/2023   Procedure: TOTAL KNEE ARTHROPLASTY;  Surgeon: Ernie Cough, MD;  Location: [facility];  Service: Orthopedics;  Laterality: Right;   VAGINAL HYSTERECTOMY       ALLERGIES:  Allergies  Allergen Reactions   Other Rash    Tide & Gain Detergent     CURRENT MEDICATIONS:  Outpatient Encounter Medications as of 03/27/2024  Medication Sig   anastrozole  (ARIMIDEX ) 1 MG tablet TAKE 1 TABLET BY MOUTH DAILY   busPIRone  (BUSPAR ) 5 MG tablet Take 5 mg by mouth 2 (two) times daily.   cetirizine (  ZYRTEC) 10 MG tablet Take 10 mg by mouth daily.   hydrochlorothiazide  (HYDRODIURIL ) 25 MG tablet Take 25 mg by mouth every morning.   lisinopril  (ZESTRIL ) 20 MG tablet Take 20 mg by mouth at bedtime.   traZODone  (DESYREL ) 150 MG tablet Take 150 mg by mouth at bedtime.   methocarbamol  (ROBAXIN ) 500 MG tablet Take 1 tablet (500 mg total) by mouth every 6 (six) hours as needed for muscle spasms.   nitrofurantoin, macrocrystal-monohydrate, (MACROBID) 100 MG capsule Take 100 mg by mouth 2 (two) times daily.   oxyCODONE  (OXY IR/ROXICODONE ) 5 MG immediate release tablet Take 1 tablet (5 mg total) by mouth every 4 (four) hours as needed for severe pain (pain score 7-10).    polyethylene glycol (MIRALAX  / GLYCOLAX ) 17 g packet Take 17 g by mouth 2 (two) times daily.   [DISCONTINUED] carboxymethylcellulose (REFRESH TEARS) 0.5 % SOLN Place 1 drop into both eyes daily as needed (dry eyes).   No facility-administered encounter medications on file as of 03/27/2024.     ONCOLOGIC FAMILY HISTORY:  Family History  Problem Relation Age of Onset   Atrial fibrillation Father    Emphysema Father    Heart attack Father        multiple   Heart disease Father    Hypertension Sister    Stroke Sister        x6 strokes is 69 y/o   Asthma Sister    Diabetes Sister    Depression Brother    Heart disease Brother    Coronary artery disease Brother        x3 CABG   Atrial fibrillation Maternal Grandmother    Diabetes Maternal Grandmother    Heart disease Maternal Grandmother    Hypertension Maternal Grandmother    Atrial fibrillation Maternal Grandfather    Heart disease Maternal Grandfather    Hypertension Maternal Grandfather    Breast cancer Paternal Grandmother        dx. 29s   Cancer Paternal Grandmother        uterine or ovarian, unsure if breast cancer metastasis or second primary   Heart disease Paternal Grandfather    Hypertension Paternal Grandfather    Atrial fibrillation Paternal Grandfather    Breast cancer Paternal Aunt        dx. 33s   Colon cancer Paternal Aunt    Colon cancer Paternal Uncle        dx. 50s   Lung cancer Paternal Uncle      SOCIAL HISTORY:  Social History   Socioeconomic History   Marital status: Married    Spouse name: Not on file   Number of children: 2   Years of education: Not on file   Highest education level: Not on file  Occupational History    Employer: [id number]  Tobacco Use   Smoking status: Former    Current packs/day: 0.00    Average packs/day: 0.5 packs/day for 20.0 years (10.0 ttl pk-yrs)    Types: Cigarettes    Start date: 08/31/1970    Quit date: 08/31/1990    Years since quitting: 33.5   Smokeless  tobacco: Never  Vaping Use   Vaping status: Never Used  Substance and Sexual Activity   Alcohol use: Yes    Alcohol/week: 0.0 standard drinks of alcohol    Comment: Rare   Drug use: No   Sexual activity: Yes    Birth control/protection: Surgical, Post-menopausal    Comment: 1st intercourse 73 yo-Fewer than 5 partners  Other Topics Concern   Not on file  Social History Narrative   Not on file   Social Drivers of Health   Financial Resource Strain: Low Risk  (11/26/2021)   Overall Financial Resource Strain (CARDIA)    Difficulty of Paying Living Expenses: Not hard at all  Food Insecurity: No Food Insecurity (07/27/2023)   Hunger Vital Sign    Worried About Running Out of Food in the Last Year: Never true    Ran Out of Food in the Last Year: Never true  Transportation Needs: No Transportation Needs (07/27/2023)   PRAPARE - Administrator, Civil Service (Medical): No    Lack of Transportation (Non-Medical): No  Physical Activity: Not on file  Stress: Not on file  Social Connections: Unknown (03/18/2022)   Received from [facility]   Social Network    Social Network: Not on file  Intimate Partner Violence: Not At Risk (07/27/2023)   Humiliation, Afraid, Rape, and Kick questionnaire    Fear of Current or Ex-Partner: No    Emotionally Abused: No    Physically Abused: No    Sexually Abused: No     OBSERVATIONS/OBJECTIVE:  BP 123/65 (BP Location: Left Arm, Patient Position: Sitting)   Pulse 82   Temp 98.3 F (36.8 C) (Temporal)   Resp 18   Wt 193 lb 8 oz (87.8 kg)   SpO2 98%   BMI 31.71 kg/m  GENERAL: Patient is a well appearing female in no acute distress Breasts: Bilateral breasts inspected and palpated.  Left breast smaller than right breast due to postsurgical changes.  No concerns otherwise.  No regional adenopathy.  LABORATORY DATA:  None for this visit.  DIAGNOSTIC IMAGING:  None for this visit.      ASSESSMENT AND PLAN:   Malignant neoplasm  of upper-outer quadrant of right breast in female, estrogen receptor positive (HCC) Assessment and Plan Assessment & Plan Breast cancer She is on adj anastrozole  and vitamin D3. Mammogram results satisfactory. - Continue anastrozole  and vitamin D3. - Follow up in 6 months.  Osteopenia Osteopenia with improved T-score of -1.4. No progression to osteoporosis. - Encourage walking 30 minutes a day, 5 days a week. - Continue vitamin D supplementation. - Monitor bone density every 2 years.  Hot flashes Intermittent nocturnal hot flashes. Encourage drinking 1.5 to 2 liters of water  daily.  Leg cramps Intermittent leg and ankle cramps. Magnesium supplementation improved sleep and muscle relaxation.  Memory concerns Reported memory concerns. Recent testing showed no abnormalities. Baseline established. - Obtain printout of recent test results from primary care physician for records.     *Total Encounter Time as defined by the Centers for Medicare and Medicaid Services includes, in addition to the face-to-face time of a patient visit (documented in the note above) non-face-to-face time: obtaining and reviewing outside history, ordering and reviewing medications, tests or procedures, care coordination (communications with other health care professionals or caregivers) and documentation in the medical record.

## 2024-03-27 NOTE — Telephone Encounter (Signed)
 Claire Bernard called in to re-schedule her appointment to Monday 1/25 instead of Tuesday 1/26.

## 2024-05-06 ENCOUNTER — Other Ambulatory Visit: Payer: Self-pay | Admitting: Hematology and Oncology

## 2024-05-06 DIAGNOSIS — Z170 Estrogen receptor positive status [ER+]: Secondary | ICD-10-CM

## 2024-05-06 DIAGNOSIS — Z86000 Personal history of in-situ neoplasm of breast: Secondary | ICD-10-CM

## 2024-05-08 ENCOUNTER — Other Ambulatory Visit: Payer: Self-pay | Admitting: Family

## 2024-05-08 DIAGNOSIS — S2002XA Contusion of left breast, initial encounter: Secondary | ICD-10-CM

## 2024-05-09 ENCOUNTER — Ambulatory Visit
Admission: RE | Admit: 2024-05-09 | Discharge: 2024-05-09 | Disposition: A | Discharge status: Home / Self Care | Source: Ambulatory Visit | Attending: Family | Admitting: Family

## 2024-05-09 DIAGNOSIS — S2002XA Contusion of left breast, initial encounter: Secondary | ICD-10-CM

## 2024-08-14 ENCOUNTER — Telehealth: Payer: Self-pay | Admitting: Hematology and Oncology

## 2024-08-14 NOTE — Telephone Encounter (Signed)
 I spoke with patient regarding her 09/25/24 appt being rescheduled to 09/26/24. Patient is aware of new appt date and time.

## 2024-09-04 ENCOUNTER — Ambulatory Visit: Attending: Surgery

## 2024-09-04 VITALS — Wt 193.5 lb

## 2024-09-04 DIAGNOSIS — Z483 Aftercare following surgery for neoplasm: Secondary | ICD-10-CM | POA: Insufficient documentation

## 2024-09-04 NOTE — Therapy (Signed)
 " OUTPATIENT PHYSICAL THERAPY SOZO SCREENING NOTE   Patient Name: Claire Bernard MRN: [id number] DOB:July 17, 1951, 74 y.o., female Today's Date: 09/04/2024  PCP: Loring Tanda Mae, MD REFERRING PROVIDER: Vernetta Berg, MD   PT End of Session - 09/04/24 [phone]     Visit Number 7   # unchanged due to screen only   PT Start Time 0939    PT Stop Time 0944    PT Time Calculation (min) 5 min    Activity Tolerance Patient tolerated treatment well    Behavior During Therapy [facility] for tasks assessed/performed          Past Medical History:  Diagnosis Date   Anxiety    panic attacks   Cancer (HCC) 07/2004   breast cancer   Chest pressure    Clotting disorder 1970's   from birthcontrol    Esophageal spasm    Fatigue    GERD (gastroesophageal reflux disease)    History of dizziness    Hyperlipidemia    Hypertension    Laryngitis    Low bone mass    Neuromuscular disorder (HCC)    neuropathy in both feet   Personal history of radiation therapy 07/2004   left breast   Pre-diabetes    Past Surgical History:  Procedure Laterality Date   ANKLE FRACTURE SURGERY     BREAST LUMPECTOMY Left 07/17/2004   left breast   BREAST LUMPECTOMY Right 11/2021   BREAST LUMPECTOMY WITH RADIOACTIVE SEED AND SENTINEL LYMPH NODE BIOPSY Right 12/15/2021   Procedure: RIGHT BREAST LUMPECTOMY WITH RADIOACTIVE SEED AND SENTINEL LYMPH NODE BIOPSY;  Surgeon: Vernetta Berg, MD;  Location: [facility];  Service: General;  Laterality: Right;   cataract surg     KNEE SURGERY     Arthroscopic   SHOULDER SURGERY     TOTAL KNEE ARTHROPLASTY Right 07/27/2023   Procedure: TOTAL KNEE ARTHROPLASTY;  Surgeon: Ernie Cough, MD;  Location: [facility];  Service: Orthopedics;  Laterality: Right;   VAGINAL HYSTERECTOMY     Patient Active Problem List   Diagnosis Date Noted   S/P total knee arthroplasty, right 07/27/2023   Vaginal atrophy 12/08/2021   History of osteopenia 12/08/2021    Genetic testing 12/04/2021   Malignant neoplasm of upper-outer quadrant of right breast in female, estrogen receptor positive (HCC) 11/24/2021   History of ductal carcinoma in situ (DCIS) of breast 10/04/2019   Low bone mass    Hypertension    Esophageal spasm     REFERRING DIAG: right breast cancer at risk for lymphedema  THERAPY DIAG: Aftercare following surgery for neoplasm  PERTINENT HISTORY:  Patient was diagnosed on 10/30/2021 with right grade II invasive ductal carcinoma breast cancer. She underwent a right lumpectomy and sentinel node biopsy (1 negative node) on 12/15/2021 It is ER/PR positive and HER2 negative with a Ki67 of 40%. She had left breast cancer in 2005 which was treated with surgery, radiation, and anti-estrogen therapy. No axillary nodes removed per her report.   PRECAUTIONS: right UE Lymphedema risk,   SUBJECTIVE: Pt returns for her first 6 month L-Dex screen.   PAIN:  Are you having pain? No  SOZO SCREENING: Patient was assessed today using the SOZO machine to determine the lymphedema index score. This was compared to her baseline score. It was determined that she is within the recommended range when compared to her baseline and no further action is needed at this time. She will continue SOZO screenings. These are done every 3 months  for 2 years post operatively followed by every 6 months for 2 years, and then annually.     L-DEX FLOWSHEETS - 09/04/24 0900       L-DEX LYMPHEDEMA SCREENING   Measurement Type Unilateral    L-DEX MEASUREMENT EXTREMITY Upper Extremity    POSITION  Standing    DOMINANT SIDE Right    At Risk Side Right    BASELINE SCORE (UNILATERAL) -5.4    L-DEX SCORE (UNILATERAL) -3.8    VALUE CHANGE (UNILAT) 1.6          P: Cont every 6 month L-Dex screens until 11/2025, then can transition to annual.   Aden Berwyn Caldron, PTA 09/04/2024, 9:42 AM        "

## 2024-09-21 ENCOUNTER — Telehealth: Payer: Self-pay | Admitting: Hematology and Oncology

## 2024-09-21 NOTE — Telephone Encounter (Signed)
 I returned patient's voicemail to reschedule MD appointment from 09/26/2024 to 10/19/2024. Patient aware of date/time change.

## 2024-09-25 ENCOUNTER — Ambulatory Visit: Admitting: Hematology and Oncology

## 2024-09-26 ENCOUNTER — Inpatient Hospital Stay: Admitting: Hematology and Oncology

## 2024-09-26 ENCOUNTER — Ambulatory Visit: Admitting: Hematology and Oncology

## 2024-10-19 ENCOUNTER — Inpatient Hospital Stay: Admitting: Hematology and Oncology

## 2025-03-05 ENCOUNTER — Ambulatory Visit: Attending: Surgery
